# Patient Record
Sex: Male | Born: 1965
Health system: Southern US, Community
[De-identification: ages and names within clinical notes are randomized; demographics above are authoritative.]

## PROBLEM LIST (undated history)

## (undated) DIAGNOSIS — M1990 Unspecified osteoarthritis, unspecified site: Secondary | ICD-10-CM

## (undated) DIAGNOSIS — T4145XA Adverse effect of unspecified anesthetic, initial encounter: Secondary | ICD-10-CM

## (undated) DIAGNOSIS — T8859XA Other complications of anesthesia, initial encounter: Secondary | ICD-10-CM

## (undated) DIAGNOSIS — J449 Chronic obstructive pulmonary disease, unspecified: Secondary | ICD-10-CM

## (undated) DIAGNOSIS — D649 Anemia, unspecified: Secondary | ICD-10-CM

## (undated) DIAGNOSIS — I10 Essential (primary) hypertension: Secondary | ICD-10-CM

## (undated) DIAGNOSIS — J189 Pneumonia, unspecified organism: Secondary | ICD-10-CM

## (undated) DIAGNOSIS — K219 Gastro-esophageal reflux disease without esophagitis: Secondary | ICD-10-CM

## (undated) DIAGNOSIS — R569 Unspecified convulsions: Secondary | ICD-10-CM

## (undated) DIAGNOSIS — L930 Discoid lupus erythematosus: Secondary | ICD-10-CM

## (undated) DIAGNOSIS — D759 Disease of blood and blood-forming organs, unspecified: Secondary | ICD-10-CM

## (undated) HISTORY — PX: BACK SURGERY: SHX140

## (undated) HISTORY — PX: OTHER SURGICAL HISTORY: SHX169

## (undated) HISTORY — PX: TONSILLECTOMY: SUR1361

## (undated) HISTORY — PX: HIP SURGERY: SHX245

---

## 1998-02-09 ENCOUNTER — Ambulatory Visit (HOSPITAL_COMMUNITY): Admission: RE | Admit: 1998-02-09 | Discharge: 1998-02-09 | Payer: Self-pay | Admitting: *Deleted

## 1998-02-11 ENCOUNTER — Ambulatory Visit (HOSPITAL_BASED_OUTPATIENT_CLINIC_OR_DEPARTMENT_OTHER): Admission: RE | Admit: 1998-02-11 | Discharge: 1998-02-11 | Payer: Self-pay | Admitting: *Deleted

## 1998-02-13 ENCOUNTER — Emergency Department (HOSPITAL_COMMUNITY): Admission: EM | Admit: 1998-02-13 | Discharge: 1998-02-13 | Payer: Self-pay | Admitting: Emergency Medicine

## 1998-02-14 ENCOUNTER — Ambulatory Visit (HOSPITAL_COMMUNITY): Admission: RE | Admit: 1998-02-14 | Discharge: 1998-02-14 | Payer: Self-pay | Admitting: *Deleted

## 1998-09-29 ENCOUNTER — Ambulatory Visit (HOSPITAL_COMMUNITY): Admission: RE | Admit: 1998-09-29 | Discharge: 1998-09-29 | Payer: Self-pay | Admitting: Orthopedic Surgery

## 1998-12-15 ENCOUNTER — Ambulatory Visit (HOSPITAL_BASED_OUTPATIENT_CLINIC_OR_DEPARTMENT_OTHER): Admission: RE | Admit: 1998-12-15 | Discharge: 1998-12-15 | Payer: Self-pay | Admitting: Orthopedic Surgery

## 1999-06-17 ENCOUNTER — Emergency Department (HOSPITAL_COMMUNITY): Admission: EM | Admit: 1999-06-17 | Discharge: 1999-06-17 | Payer: Self-pay | Admitting: Emergency Medicine

## 1999-08-16 ENCOUNTER — Emergency Department (HOSPITAL_COMMUNITY): Admission: EM | Admit: 1999-08-16 | Discharge: 1999-08-16 | Payer: Self-pay

## 1999-09-16 ENCOUNTER — Emergency Department (HOSPITAL_COMMUNITY): Admission: EM | Admit: 1999-09-16 | Discharge: 1999-09-16 | Payer: Self-pay | Admitting: Emergency Medicine

## 1999-09-27 ENCOUNTER — Emergency Department (HOSPITAL_COMMUNITY): Admission: EM | Admit: 1999-09-27 | Discharge: 1999-09-27 | Payer: Self-pay | Admitting: Emergency Medicine

## 1999-10-28 ENCOUNTER — Encounter: Payer: Self-pay | Admitting: Emergency Medicine

## 1999-10-28 ENCOUNTER — Emergency Department (HOSPITAL_COMMUNITY): Admission: EM | Admit: 1999-10-28 | Discharge: 1999-10-28 | Payer: Self-pay | Admitting: Emergency Medicine

## 2000-01-18 ENCOUNTER — Emergency Department (HOSPITAL_COMMUNITY): Admission: EM | Admit: 2000-01-18 | Discharge: 2000-01-18 | Payer: Self-pay

## 2000-07-19 ENCOUNTER — Encounter: Payer: Self-pay | Admitting: Emergency Medicine

## 2000-07-19 ENCOUNTER — Emergency Department (HOSPITAL_COMMUNITY): Admission: EM | Admit: 2000-07-19 | Discharge: 2000-07-19 | Payer: Self-pay | Admitting: Emergency Medicine

## 2000-12-25 ENCOUNTER — Encounter: Payer: Self-pay | Admitting: Emergency Medicine

## 2000-12-25 ENCOUNTER — Emergency Department (HOSPITAL_COMMUNITY): Admission: EM | Admit: 2000-12-25 | Discharge: 2000-12-25 | Payer: Self-pay

## 2001-01-14 ENCOUNTER — Encounter: Admission: RE | Admit: 2001-01-14 | Discharge: 2001-01-14 | Payer: Self-pay | Admitting: Internal Medicine

## 2001-04-23 ENCOUNTER — Emergency Department (HOSPITAL_COMMUNITY): Admission: EM | Admit: 2001-04-23 | Discharge: 2001-04-23 | Payer: Self-pay | Admitting: Emergency Medicine

## 2001-04-23 ENCOUNTER — Encounter: Payer: Self-pay | Admitting: Emergency Medicine

## 2001-05-03 ENCOUNTER — Emergency Department (HOSPITAL_COMMUNITY): Admission: EM | Admit: 2001-05-03 | Discharge: 2001-05-03 | Payer: Self-pay | Admitting: Emergency Medicine

## 2001-08-27 ENCOUNTER — Emergency Department (HOSPITAL_COMMUNITY): Admission: EM | Admit: 2001-08-27 | Discharge: 2001-08-27 | Payer: Self-pay

## 2002-07-24 ENCOUNTER — Ambulatory Visit (HOSPITAL_COMMUNITY): Admission: RE | Admit: 2002-07-24 | Discharge: 2002-07-24 | Payer: Self-pay | Admitting: Orthopedic Surgery

## 2002-08-06 ENCOUNTER — Emergency Department (HOSPITAL_COMMUNITY): Admission: EM | Admit: 2002-08-06 | Discharge: 2002-08-06 | Payer: Self-pay | Admitting: Emergency Medicine

## 2002-08-06 ENCOUNTER — Encounter: Payer: Self-pay | Admitting: Emergency Medicine

## 2002-08-12 ENCOUNTER — Ambulatory Visit (HOSPITAL_COMMUNITY): Admission: RE | Admit: 2002-08-12 | Discharge: 2002-08-12 | Payer: Self-pay | Admitting: Orthopedic Surgery

## 2002-08-12 ENCOUNTER — Encounter: Payer: Self-pay | Admitting: Orthopedic Surgery

## 2002-08-28 ENCOUNTER — Encounter: Admission: RE | Admit: 2002-08-28 | Discharge: 2002-11-26 | Payer: Self-pay | Admitting: Orthopedic Surgery

## 2003-02-05 ENCOUNTER — Encounter: Admission: RE | Admit: 2003-02-05 | Discharge: 2003-02-05 | Payer: Self-pay | Admitting: Orthopedic Surgery

## 2003-02-05 ENCOUNTER — Encounter: Payer: Self-pay | Admitting: Orthopedic Surgery

## 2003-02-18 ENCOUNTER — Encounter: Admission: RE | Admit: 2003-02-18 | Discharge: 2003-02-18 | Payer: Self-pay | Admitting: Orthopedic Surgery

## 2003-02-18 ENCOUNTER — Encounter: Payer: Self-pay | Admitting: Orthopedic Surgery

## 2003-08-18 ENCOUNTER — Encounter: Admission: RE | Admit: 2003-08-18 | Discharge: 2003-08-18 | Payer: Self-pay | Admitting: Neurosurgery

## 2003-12-02 ENCOUNTER — Inpatient Hospital Stay (HOSPITAL_COMMUNITY): Admission: RE | Admit: 2003-12-02 | Discharge: 2003-12-05 | Payer: Self-pay | Admitting: Neurosurgery

## 2004-01-19 ENCOUNTER — Encounter: Admission: RE | Admit: 2004-01-19 | Discharge: 2004-01-19 | Payer: Self-pay | Admitting: Neurosurgery

## 2004-03-22 ENCOUNTER — Encounter: Admission: RE | Admit: 2004-03-22 | Discharge: 2004-03-22 | Payer: Self-pay | Admitting: Neurosurgery

## 2004-06-21 ENCOUNTER — Encounter: Admission: RE | Admit: 2004-06-21 | Discharge: 2004-06-21 | Payer: Self-pay | Admitting: Neurosurgery

## 2004-10-20 ENCOUNTER — Encounter: Admission: RE | Admit: 2004-10-20 | Discharge: 2004-10-20 | Payer: Self-pay | Admitting: Neurosurgery

## 2004-12-18 ENCOUNTER — Emergency Department (HOSPITAL_COMMUNITY): Admission: EM | Admit: 2004-12-18 | Discharge: 2004-12-19 | Payer: Self-pay | Admitting: Emergency Medicine

## 2005-12-15 ENCOUNTER — Inpatient Hospital Stay (HOSPITAL_COMMUNITY): Admission: EM | Admit: 2005-12-15 | Discharge: 2005-12-18 | Payer: Self-pay | Admitting: Emergency Medicine

## 2005-12-20 ENCOUNTER — Ambulatory Visit: Payer: Self-pay | Admitting: Family Medicine

## 2006-01-04 ENCOUNTER — Ambulatory Visit: Payer: Self-pay | Admitting: Family Medicine

## 2006-01-11 ENCOUNTER — Ambulatory Visit: Payer: Self-pay | Admitting: Family Medicine

## 2006-02-28 ENCOUNTER — Ambulatory Visit: Payer: Self-pay | Admitting: Family Medicine

## 2006-05-15 ENCOUNTER — Ambulatory Visit: Payer: Self-pay | Admitting: Family Medicine

## 2006-10-29 ENCOUNTER — Ambulatory Visit: Payer: Self-pay | Admitting: Family Medicine

## 2006-12-01 ENCOUNTER — Emergency Department (HOSPITAL_COMMUNITY): Admission: EM | Admit: 2006-12-01 | Discharge: 2006-12-01 | Payer: Self-pay | Admitting: Family Medicine

## 2007-01-21 ENCOUNTER — Encounter: Admission: RE | Admit: 2007-01-21 | Discharge: 2007-01-21 | Payer: Self-pay | Admitting: Neurosurgery

## 2007-02-15 ENCOUNTER — Emergency Department (HOSPITAL_COMMUNITY): Admission: EM | Admit: 2007-02-15 | Discharge: 2007-02-15 | Payer: Self-pay | Admitting: Emergency Medicine

## 2007-04-03 ENCOUNTER — Encounter: Admission: RE | Admit: 2007-04-03 | Discharge: 2007-04-03 | Payer: Self-pay | Admitting: Neurosurgery

## 2007-05-29 ENCOUNTER — Inpatient Hospital Stay (HOSPITAL_COMMUNITY): Admission: RE | Admit: 2007-05-29 | Discharge: 2007-06-03 | Payer: Self-pay | Admitting: Neurosurgery

## 2007-06-09 ENCOUNTER — Inpatient Hospital Stay (HOSPITAL_COMMUNITY): Admission: EM | Admit: 2007-06-09 | Discharge: 2007-06-10 | Payer: Self-pay | Admitting: Family Medicine

## 2007-07-20 ENCOUNTER — Encounter: Admission: RE | Admit: 2007-07-20 | Discharge: 2007-07-20 | Payer: Self-pay | Admitting: Neurosurgery

## 2008-03-06 ENCOUNTER — Encounter: Admission: RE | Admit: 2008-03-06 | Discharge: 2008-03-06 | Payer: Self-pay | Admitting: Neurosurgery

## 2008-04-12 ENCOUNTER — Encounter: Admission: RE | Admit: 2008-04-12 | Discharge: 2008-04-12 | Payer: Self-pay | Admitting: Neurosurgery

## 2008-07-16 ENCOUNTER — Encounter: Admission: RE | Admit: 2008-07-16 | Discharge: 2008-07-16 | Payer: Self-pay | Admitting: Neurosurgery

## 2008-09-23 ENCOUNTER — Encounter: Admission: RE | Admit: 2008-09-23 | Discharge: 2008-09-23 | Payer: Self-pay | Admitting: Family Medicine

## 2009-05-20 ENCOUNTER — Encounter: Admission: RE | Admit: 2009-05-20 | Discharge: 2009-05-20 | Payer: Self-pay | Admitting: Neurosurgery

## 2009-06-16 ENCOUNTER — Ambulatory Visit (HOSPITAL_COMMUNITY): Admission: RE | Admit: 2009-06-16 | Discharge: 2009-06-16 | Payer: Self-pay | Admitting: Neurosurgery

## 2009-07-10 ENCOUNTER — Emergency Department (HOSPITAL_COMMUNITY): Admission: EM | Admit: 2009-07-10 | Discharge: 2009-07-10 | Payer: Self-pay | Admitting: Family Medicine

## 2009-07-19 ENCOUNTER — Emergency Department (HOSPITAL_COMMUNITY): Admission: EM | Admit: 2009-07-19 | Discharge: 2009-07-19 | Payer: Self-pay | Admitting: Emergency Medicine

## 2009-07-19 ENCOUNTER — Ambulatory Visit (HOSPITAL_COMMUNITY): Admission: RE | Admit: 2009-07-19 | Discharge: 2009-07-19 | Payer: Self-pay | Admitting: Emergency Medicine

## 2009-09-23 ENCOUNTER — Encounter: Admission: RE | Admit: 2009-09-23 | Discharge: 2009-09-23 | Payer: Self-pay | Admitting: Neurosurgery

## 2009-09-24 ENCOUNTER — Inpatient Hospital Stay (HOSPITAL_COMMUNITY): Admission: RE | Admit: 2009-09-24 | Discharge: 2009-09-25 | Payer: Self-pay | Admitting: Neurosurgery

## 2009-10-05 ENCOUNTER — Encounter: Admission: RE | Admit: 2009-10-05 | Discharge: 2009-10-05 | Payer: Self-pay | Admitting: Neurosurgery

## 2009-11-18 ENCOUNTER — Encounter: Admission: RE | Admit: 2009-11-18 | Discharge: 2009-11-18 | Payer: Self-pay | Admitting: Neurosurgery

## 2009-12-29 ENCOUNTER — Encounter: Admission: RE | Admit: 2009-12-29 | Discharge: 2009-12-29 | Payer: Self-pay | Admitting: Neurosurgery

## 2010-03-03 ENCOUNTER — Encounter: Admission: RE | Admit: 2010-03-03 | Discharge: 2010-03-03 | Payer: Self-pay | Admitting: Neurosurgery

## 2010-05-10 ENCOUNTER — Encounter: Admission: RE | Admit: 2010-05-10 | Discharge: 2010-05-10 | Payer: Self-pay | Admitting: Neurosurgery

## 2010-07-14 ENCOUNTER — Encounter
Admission: RE | Admit: 2010-07-14 | Discharge: 2010-07-14 | Payer: Self-pay | Source: Home / Self Care | Attending: Neurosurgery | Admitting: Neurosurgery

## 2010-07-24 ENCOUNTER — Encounter: Payer: Self-pay | Admitting: Neurosurgery

## 2010-07-24 ENCOUNTER — Encounter: Payer: Self-pay | Admitting: Orthopedic Surgery

## 2010-07-25 ENCOUNTER — Encounter: Payer: Self-pay | Admitting: Neurosurgery

## 2010-08-01 ENCOUNTER — Encounter
Admission: RE | Admit: 2010-08-01 | Discharge: 2010-08-01 | Payer: Self-pay | Source: Home / Self Care | Attending: Neurosurgery | Admitting: Neurosurgery

## 2010-08-03 ENCOUNTER — Encounter: Payer: Self-pay | Admitting: Neurosurgery

## 2010-08-10 ENCOUNTER — Encounter (HOSPITAL_COMMUNITY)
Admission: RE | Admit: 2010-08-10 | Discharge: 2010-08-10 | Disposition: A | Discharge status: Home / Self Care | Payer: Medicare Other | Source: Ambulatory Visit | Attending: Hematology and Oncology | Admitting: Hematology and Oncology

## 2010-08-10 DIAGNOSIS — M40299 Other kyphosis, site unspecified: Secondary | ICD-10-CM | POA: Insufficient documentation

## 2010-08-10 DIAGNOSIS — Z01810 Encounter for preprocedural cardiovascular examination: Secondary | ICD-10-CM | POA: Insufficient documentation

## 2010-08-10 DIAGNOSIS — Z01812 Encounter for preprocedural laboratory examination: Secondary | ICD-10-CM | POA: Insufficient documentation

## 2010-08-10 LAB — CBC
HCT: 41.5 % (ref 39.0–52.0)
Hemoglobin: 13.9 g/dL (ref 13.0–17.0)
MCH: 29.6 pg (ref 26.0–34.0)
MCHC: 33.5 g/dL (ref 30.0–36.0)
MCV: 88.3 fL (ref 78.0–100.0)
Platelets: 268 10*3/uL (ref 150–400)
RBC: 4.7 MIL/uL (ref 4.22–5.81)
RDW: 13 % (ref 11.5–15.5)
WBC: 6 10*3/uL (ref 4.0–10.5)

## 2010-08-10 LAB — BASIC METABOLIC PANEL
BUN: 10 mg/dL (ref 6–23)
CO2: 30 mEq/L (ref 19–32)
Calcium: 9.7 mg/dL (ref 8.4–10.5)
Chloride: 104 mEq/L (ref 96–112)
Creatinine, Ser: 1.06 mg/dL (ref 0.4–1.5)
GFR calc Af Amer: >60 mL/min (ref >60)
GFR calc non Af Amer: >60 mL/min (ref >60)
Glucose, Bld: 123 mg/dL — ABNORMAL HIGH (ref 70–99)
Potassium: 4.5 mEq/L (ref 3.5–5.1)
Sodium: 142 mEq/L (ref 135–145)

## 2010-08-10 LAB — SURGICAL PCR SCREEN: Staphylococcus aureus: NEGATIVE

## 2010-08-12 ENCOUNTER — Ambulatory Visit (HOSPITAL_COMMUNITY)
Admission: RE | Admit: 2010-08-12 | Discharge: 2010-08-12 | Disposition: A | Discharge status: Home / Self Care | Payer: Medicare Other | Source: Ambulatory Visit | Attending: Neurosurgery | Admitting: Neurosurgery

## 2010-08-12 ENCOUNTER — Other Ambulatory Visit: Payer: Self-pay | Admitting: Neurosurgery

## 2010-08-12 ENCOUNTER — Inpatient Hospital Stay (HOSPITAL_COMMUNITY)
Admission: RE | Admit: 2010-08-12 | Discharge: 2010-08-15 | DRG: 458 | Disposition: A | Discharge status: Home / Self Care | Payer: Medicare Other | Source: Ambulatory Visit | Attending: Neurosurgery | Admitting: Neurosurgery

## 2010-08-12 DIAGNOSIS — Z01810 Encounter for preprocedural cardiovascular examination: Secondary | ICD-10-CM

## 2010-08-12 DIAGNOSIS — M545 Low back pain: Secondary | ICD-10-CM

## 2010-08-12 DIAGNOSIS — Z981 Arthrodesis status: Secondary | ICD-10-CM

## 2010-08-12 DIAGNOSIS — Z01812 Encounter for preprocedural laboratory examination: Secondary | ICD-10-CM

## 2010-08-12 DIAGNOSIS — M40299 Other kyphosis, site unspecified: Principal | ICD-10-CM | POA: Diagnosis present

## 2010-08-12 LAB — CBC
HCT: 37.5 % — ABNORMAL LOW (ref 39.0–52.0)
Hemoglobin: 12.3 g/dL — ABNORMAL LOW (ref 13.0–17.0)
MCH: 28.8 pg (ref 26.0–34.0)
MCHC: 32.8 g/dL (ref 30.0–36.0)
MCV: 87.8 fL (ref 78.0–100.0)

## 2010-08-13 ENCOUNTER — Inpatient Hospital Stay (HOSPITAL_COMMUNITY): Payer: Medicare Other

## 2010-08-13 LAB — GLUCOSE, CAPILLARY
Glucose-Capillary: 147 mg/dL — ABNORMAL HIGH (ref 70–99)
Glucose-Capillary: 130 mg/dL — ABNORMAL HIGH (ref 70–99)
Glucose-Capillary: 146 mg/dL — ABNORMAL HIGH (ref 70–99)

## 2010-08-14 LAB — GLUCOSE, CAPILLARY
Glucose-Capillary: 125 mg/dL — ABNORMAL HIGH (ref 70–99)
Glucose-Capillary: 107 mg/dL — ABNORMAL HIGH (ref 70–99)

## 2010-08-15 LAB — GLUCOSE, CAPILLARY: Glucose-Capillary: 103 mg/dL — ABNORMAL HIGH (ref 70–99)

## 2010-08-25 NOTE — Op Note (Addendum)
NAME:  LEVII, HAIRFIELD NO.:  [id number]  MEDICAL RECORD NO.:  [id number]           PATIENT TYPE:  O  LOCATION:  XRAY                         FACILITY:  [facility]  PHYSICIAN:  Donalee Citrin, M.D.        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  PROCEDURES:  Re-exploration of fusion, removal of hardware L3-4, pedicle screw fixation L2-L4, posterolateral arthrodesis L2-L4 using BMP and Actifuse.  SURGEON:  Donalee Citrin, MD  ASSISTANT:  Kathaleen Maser. Pool, MD  ANESTHESIA:  General endotracheal.  HISTORY OF PRESENT ILLNESS:  The patient is a very pleasant 45 year old gentleman who underwent a L2-3 standard XLIF several months ago.  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  Due to the patient's failure to conservative treatment, MRI, and CT findings, the patient was recommended reexploration of fusion and redo posterolateral fusion at L3- 4 as well as posterior augmentation at L2-3 with pedicle screws.  Risks and benefits of the operation were explained.  The patient understood and agreed to proceed forth.  DESCRIPTION OF PROCEDURE:  The patient was brought to the OR, was induced general anesthesia, positioned prone on the Wilson frame.  Back was prepped and draped in usual sterile fashion.  His old incision was opened up and extended cephalad.  The T-piece at L2 were identified after the scar tissue was dissected free and the hardware was identified.  T-piece were exposed at L2, 3, and 4, the nuts were removed and the rods removed and the fusion in the L3-4 appeared to be solid, although  there was some breaks in the bone in the posterolateral spaces upon visualization, so after adequate T-piece had been exposed and the L2 pedicle had been exposed, fluoroscopy was brought in and the L2 pedicle was cannulated, probed with a tap and the awl, probed tap with a 5.5 tap and a 6.5 x 45 screw inserted at L2 bilaterally.  Initially on the right side L2 screw had some difficulty in one direction was performed.  The pedicle was easily cannulated and from using fluoroscopy bony landmarks, the canal, as well as probing within the pedicle, this confirmed the mediolateral breach.  Then after copious irrigation, aggressive decortication was carried out at T-piece at L2, 3, 4, 5 bilaterally.  BMP was placed in the posterolateral gutter as well as Actifuse on top and the 80-mm rods were placed and compressed from L2-L3 to reduce the kyphotic deformity and then all the nuts were anchored down and tightened down and torqued down and 2 large Hemovac drain was placed and wound was closed in layers with interrupted Vicryl and skin was closed with running 4-0 subcuticular.  Benzoin and Steri-Strips were applied.  The patient went to the recovery room in a stable condition.  ______________________________ Donalee Citrin, M.D.     GC/MEDQ  D:  08/12/2010  T:  08/13/2010  Job:  [id number]  Electronically Signed by Donalee Citrin M.D. on 08/25/2010 07:06:43 AM

## 2010-09-18 NOTE — Discharge Summary (Addendum)
  NAME:  [name], YOAKUM NO.:  [id number]  MEDICAL RECORD NO.:  [id number]           PATIENT TYPE:  O  LOCATION:  XRAY                         FACILITY:  [facility]  PHYSICIAN:  Donalee Citrin, M.D.        DATE OF BIRTH:  December 28, 1965  DATE OF ADMISSION:  08/13/2010 DATE OF DISCHARGE:  08/15/2010                              DISCHARGE SUMMARY   ADMITTING DIAGNOSIS:  Junctional kyphosis and instability, L2-3.  PROCEDURE:  Posterior segmental fixation, L2-3, for augmentation of L2-3 fusion with failure of L2-3 fusion and pseudoarthrosis L3-4.  HOSPITAL COURSE:  The patient was admitted and immediately went to the operating room, underwent the aforementioned procedure.  Postop, the patient did very well and recovering on the floor.  He was doing very well initially.  He did have a low grade swelling around his incision that was consistent with subcutaneous hematoma.  However, he did have a large Hemovac drain in place.  There was no dissection carried down to the epidural spaces.  This was all felt to be in the fascial and extrafascial compartment, so it was felt to be safe.  This was observed. He was placed on ice packs and over the next couple of days he stabilized.  He continued to mobilize progressively with significant improvement in his lower extremity pain.  At one point, he did get a CT checked of his back secondary to persistent low back pain and for evaluation of the hematoma.  However, it was benign and the screws looked being in good position.  So, his Hemovac was left in place and it was able to be taken out on hospital day #4.  He continued to have a little bit of oozing around the incision, but this did stabilize with multiple dressing changes.  The swelling did go down.  His pain became under better control with oral pain medication.  He was ambulating and voiding spontaneously and was able to be discharged home on hospital day #5 in stable condition on oral  pain medication.  At the time of discharge, the patient was ambulating, voiding spontaneously, and tolerating regular diet.          ______________________________ Donalee Citrin, M.D.     GC/MEDQ  D:  09/07/2010  T:  09/08/2010  Job:  [id number]  Electronically Signed by Donalee Citrin M.D. on 10/19/2010 05:16:44 PM

## 2010-09-26 LAB — TYPE AND SCREEN
ABO/RH(D): O POS
Antibody Screen: NEGATIVE

## 2010-09-26 LAB — CBC
HCT: 44.2 % (ref 39.0–52.0)
Platelets: 230 10*3/uL (ref 150–400)
RDW: 14.1 % (ref 11.5–15.5)
WBC: 5.1 10*3/uL (ref 4.0–10.5)

## 2010-09-26 LAB — GLUCOSE, CAPILLARY: Glucose-Capillary: 135 mg/dL — ABNORMAL HIGH (ref 70–99)

## 2010-09-26 LAB — BASIC METABOLIC PANEL
CO2: 28 mEq/L (ref 19–32)
Calcium: 9.2 mg/dL (ref 8.4–10.5)
Chloride: 103 mEq/L (ref 96–112)
GFR calc Af Amer: >60 mL/min (ref >60)
Sodium: 139 mEq/L (ref 135–145)

## 2010-09-26 LAB — SURGICAL PCR SCREEN
MRSA, PCR: NEGATIVE
Staphylococcus aureus: NEGATIVE

## 2010-10-04 LAB — BASIC METABOLIC PANEL
BUN: 11 mg/dL (ref 6–23)
CO2: 30 mEq/L (ref 19–32)
Chloride: 102 mEq/L (ref 96–112)
Creatinine, Ser: 0.85 mg/dL (ref 0.4–1.5)
GFR calc Af Amer: >60 mL/min (ref >60)
Potassium: 4.1 mEq/L (ref 3.5–5.1)

## 2010-10-04 LAB — CBC
HCT: 48.7 % (ref 39.0–52.0)
MCHC: 33.1 g/dL (ref 30.0–36.0)
MCV: 90.2 fL (ref 78.0–100.0)
RBC: 5.39 MIL/uL (ref 4.22–5.81)

## 2010-10-04 LAB — TYPE AND SCREEN: ABO/RH(D): O POS

## 2010-11-08 ENCOUNTER — Other Ambulatory Visit: Payer: Self-pay | Admitting: Neurosurgery

## 2010-11-08 ENCOUNTER — Ambulatory Visit
Admission: RE | Admit: 2010-11-08 | Discharge: 2010-11-08 | Disposition: A | Discharge status: Home / Self Care | Payer: Medicare Other | Source: Ambulatory Visit | Attending: Neurosurgery | Admitting: Neurosurgery

## 2010-11-08 DIAGNOSIS — M545 Low back pain: Secondary | ICD-10-CM

## 2010-11-15 NOTE — Op Note (Signed)
[name], [name] NO.:  [id number]   MEDICAL RECORD NO.:  [id number]          PATIENT TYPE:  INP   LOCATION:  3012                         FACILITY:  [facility]   PHYSICIAN:  Donalee Citrin, M.D.        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   PROCEDURE:  Reexploration of lumbar fusion L4 to S1 with removal of  hardware L4-S1, posterior lumbar interbody fusion L3-4 using a hybrid 12  x 22-mm PEEK allograft cage packed with locally harvested autograft  mixed with Progenix bone substitute and Tangent 12 x 26-mm allograft  wedge, pedicle screw fixation using the 6.35 legacy pedicle screw system  L3-4 with posterior lateral arthrodesis L3-4 using locally harvested  autograft mixed with Progenix bone substitute and placement of a median  Hemovac.   SURGEON:  Donalee Citrin, M.D.   ASSISTANT:  Tia Alert, M.D.   ANESTHESIA:  General endotracheal.   HISTORY OF PRESENT ILLNESS:  The patient is a very pleasant 45 year old  gentleman who underwent L4-S1 fusion back in 2005 and did very well.  Over the last several months, had progressively worsening back and  bilateral leg pain radiating to his hips and down his anterior thighs as  well as below his knees in the front of his shins consistent with both  L3 and L4 nerve root syndromes.  Repeat imaging with MRI scan and CT  scan showed progression and severe spinal stenosis L3-4 with diastasis  of facet complex and bifrontal stenosis of the L3 and L4 nerve roots at  this level.  Solid bony effusion is also noted on a CT scan at L4 to S1.  So, the patient had failed all forms of conservative treatment with oral  steroids, anti-inflammatories, physical therapy and the patient is  recommended reexploration of lumbar fusion with removal of hardware L4  to S1 and decompressive laminectomy and fusion at L3-4.  Risks and  benefits of the operative procedure were explained to the patient  __________   The patient was brought to the OR __________  under general anesthesia,  placed prone on the Wilson frame.  Back was prepped and draped in usual  sterile fashion.  His old incision was opened up and extended cephalad.  The scar tissue was dissected free down to the pedicle screw and the  hardware at L4-S1.  The fusion was inspected and was noted markedly  solid, so the cross-link was removed, and the __________  were removed  and the screws at L5-S1 were removed, again inspecting the fusion  __________  along the way and confirming solid bony fusion from L4 to  S1.  The L4 screws were left in place.  At this point, the subperiosteal  dissection was carried out at the lamina of L3, and the pedicle at L3  was identified and then the spinous process at L3 was removed.  The scar  tissue was dissected free, and  complete central decompression was  performed at L3-4 with complete medial facetectomies at L3-4.  Extensive  scar tissue was dissected off of the medial facet complex to free up the  L4 nerve root.  Then, the L3 nerve root was also unroofed, radically  decompressed out its foramen, and the L3 nerve root on the patient's  left side was noted to be markedly stenotic and partially atrophic.  This appeared to be small in caliber due to chronic compression.  This  all unroofed, teased away.  The disk was teased from underneath it as  well as the facet complex and pars were unroofed from above it.  The TP  at L4 had been then identified, and TP at L3 was identified, and this  path was cleaned out.  After complete decompression had been completed,  pilot holes were drilled at L3 pedicle on the right __________  all  probed, tapped with a 5.5 tap, probed again and a 6.5/45 screw L3 on the  right.  The pedicle was probed from within the pedicle as  well as within  the canal, and external bony landmarks were confirmed.  No mediolateral  breach.  Fluoroscopy was used each step along the way to confirm  trajectory.  In a similar fashion, L3 screw was placed on the left side  with a 6.5/45 screw there as well.  After there was 2 screws in place  and the residual L4 screws had been left in place, attention was taken  to the interbody where the right L4 nerve root was reflected medially.  Annulotomy __________  several large fragments of disk were removed.  This was markedly degenerated, and several large fragments were removed  in the central compartment.  A size 10 distractor was initially  inserted, and this was felt to be too small.  A size 12 distractor was  subsequently insert.  This had good apposition of the endplates and felt  to be appropriate sizing for graft material.  Then, the left L4 nerve  roots were reflected.  Disk space was cleaned out with a size 12 cutter  and chisel.  Again, several large fragments were removed from the  central compartments as well as underneath the 3 root, decompressing it.  The disk space was distracted, opening up the L3 foramen, and then after  this been cleaned out, a Tangent allograft was inserted on the patient's  left side to move deep to posterior vertebral line.  Then, the right  sided distractor was removed.  Disk space cleaned out in similar  fashion, again with fluoroscopy used each step along the way.  After  disk space had been chiseled and the central disk had been removed with  an Epstein curette and the central endplates had been scraped, a copious  amount of locally harvested autograft mixed with Progenix was then  packed medially against the Tangent on the left side.  Then, a right  side Telamon packed with graft material was inserted on the right.  Both  grafts were confirmed to be in good position with fluoroscopy.  Then,  the wound was copiously irrigated.  Meticulous  hemostasis was  maintained.  Aggressive decortication was carried __________  lateral  gutter.  The remainder of the autograft was packed in the lateral  gutters along L3-4 on the left and the right.  A 50-mm rod was then  inserted and __________  down the L3 pedicle screws compressed against  L4, and then a 421 cross-link  was inserted and anchored down.  All  neural foramina were reinspected prior to placing the cross-link to  confirm adequate decompression of the foramen and no migration of graft  material.  Gelfoam was overlaid atop of the nerve roots, and a medium  Hemovac drain was threaded underneath the cross-link centrally.  Then,  the muscle and fascia was approximated with interrupted Vicryls after  final fluoroscopy confirmed good position of bone graft, screws and  rods.  Then, the skin was closed with a running 4-0 subcuticular,  Benzoin and Steri-Strips.  The patient went to recovery in stable  condition __________           ______________________________  Donalee Citrin, M.D.     GC/MEDQ  D:  05/29/2007  T:  05/30/2007  Job:  [id number]

## 2010-11-15 NOTE — Op Note (Signed)
NAMEALMUS, WOODHAM NO.:  [id number]   MEDICAL RECORD NO.:  [id number]          PATIENT TYPE:  INP   LOCATION:  3012                         FACILITY:  [facility]   PHYSICIAN:  Donalee Citrin, M.D.        DATE OF BIRTH:  08/20/1965   DATE OF PROCEDURE:  05/30/2007  DATE OF DISCHARGE:                               OPERATIVE REPORT   PREOPERATIVE DIAGNOSIS:  Lumbar wound, epidural hematoma.   POSTOPERATIVE DIAGNOSIS:  Lumbar wound, epidural hematoma.   PROCEDURE:  Re-exploration for evacuation of lumbar epidural hematoma.   SURGEON:  Donalee Citrin, M.D.   ANESTHESIA:  General endotracheal.   HISTORY OF PRESENT ILLNESS:  The patient is a very pleasant 41-year  gentleman who underwent earlier today a L3-4 posterior lumbar interbody  fusion with removal of hardware and exploration of fusion L4-S1 and the  patient did very well, went to recovery room.  Postop it was stable with  postoperative Hemovac drain in place in recovery, went to the floor.  Earlier this evening the physician on call was contacted as the patient  was experiencing some numbness in his legs and some swelling around the  incision site.  He came and evaluated then I was subsequently called and  came in and evaluated the patient, took him emergently to the OR for  evacuation of a lumbar wound epidural hematoma. Risks and benefits of  the operation were explained the patient.  He understands and agreed to  proceed forth.   The patient brought to OR, induced under general anesthesia, placed  prone on Wilson Frame.  Back was prepped draped usual sterile fashion.  Old incision was opened up and immediately a very large extra fascial  hematoma was identified.  This had actually eroded through and actually  had ruptured due to swelling, some of the fascial suture.  As this was  further, the wound was opened up down to the hardware and the epidural  space an extensive amount of a very aggressive lumbar wound  hematoma  extended in the epidural space with small oozes everywhere but no large  feeder.  Once the epidural space was identified, and all the hematoma  been evacuated off thecal sac and nerve roots, there was some small  bleeders overlying the disk space underneath the nerve roots.  These  were all coagulated and there was a little bit of oozing coming from the  laminotomy defects in the bone.  This was all waxed off.  The epidural  veins were coagulated.  Extensive investigation was carried out and the  subfascial spaces along the muscle along the previous laminotomy sites  coagulating all small bleeders, but the patient clearly was oozing a  slow and steady from almost all the muscle and fascia throughout his  lumbar wound.  This was very aggressive and definitely seemed to be  extensive over normal.  This felt to be related to some sort of bleeding  diathesis maybe chronic ibuprofen use so laboratory will be sent  postoperatively for this.  After the epidural had been evacuated and  meticulous hemostasis had  been maintained the epidural space, some  Gelfoam was laid atop of the dura and a medium sized Hemovac drain was  again replaced in the epidural space, then the muscle was closed over  the top of this.  Then a large Hemovac was inserted underneath the  fascia and the muscle extending the length of where the previous fusion  had been. This also would communicate with the epidural space and the  subfascial space.  Then the fascia was then reapproximated with  interrupted Vicryls as well as the muscle and subcutaneous was closed  with  interrupted Vicryl and skin was closed with Dermabond, Benzoin, Steri-  Strips and dressed with the large Hemovac and medium size Hemovac drains  in place and sewn in place.  At the end of case, needle and sponge count  correct.  Patient went to recovery room in stable condition.           ______________________________  Donalee Citrin, M.D.      GC/MEDQ  D:  05/30/2007  T:  05/30/2007  Job:  [id number]

## 2010-11-15 NOTE — Op Note (Signed)
[name], [name] NO.:  [id number]   MEDICAL RECORD NO.:  [id number]          PATIENT TYPE:  INP   LOCATION:  3012                         FACILITY:  [facility]   PHYSICIAN:  Donalee Citrin, M.D.        DATE OF BIRTH:  03/28/1966   DATE OF PROCEDURE:  05/30/2007  DATE OF DISCHARGE:                               OPERATIVE REPORT   Note:  This is a second operation dictated on the same date.  This  second operation is 12:39.   PREOPERATIVE DIAGNOSIS:  Recurrent lumbar wound epidural hematoma.   PROCEDURE:  1. Re-exploration and evacuation for the second time of a lumbar wound      epidural hematoma with progressive paraparesis.  2. L2 laminectomy bilateral for evacuation of epidural hematoma.   SURGEON:  Donalee Citrin, M.D.   ANESTHESIA:  General endotracheal anesthesia.   HISTORY OF PRESENT ILLNESS:  The patient is a very pleasant 45 year old  gentleman who underwent an L3-4 lumbar fusion yesterday.  He had to be  taken back in the early hours of the morning this morning for evacuation  of lumbar epidural hematoma.  Postoperatively in the recovery room, the  patient was doing very well, full strength, no leg pain, no numbness or  tingling.  The patient was recovered in the recovery room and went to  the floor.  On morning rounds this morning, the patient was noted to  have complete foot drop on the left side with some numbness below the  knees and some pain down the backs of his legs.  Emergent CT scan was  ordered and the operating room was called again for reevaluation of the  hematoma.  During the CT scan, the patient's neurologic exam progressed  to lose dorsiflexion strength in his right foot and the CT scan did show  extension of his epidural hematoma behind the L2 lamina, so the patient  was emergently taken back to the OR for extension of his laminotomy  behind the two lamin and evacuation of hematoma.   OPERATIVE PROCEDURE:  The patient was brought into the  or and was  induced under general anesthesia, positioned prone on the Wilson frame,  prepped and draped in the usual fashion.  His old incision was opened up  and again as before, as soon as the dermis was opened up, a large  extrafascial hematoma was immediately visualized, however, the suture  this time was intact.  The sutures were incised.  The subfascial space  was entered.  Again a large hematoma in the wound was again aspirated  causing severe thecal sac compression.  After the hematoma was evacuated  from the epidural space, there was again noted to be oozing coming from  everywhere along the muscle and the wound in the epidural space with no  focal large bleeders, just all diffuse oozing felt to be consistent with  his chronic ibuprofen use that was not discontinued preoperatively.   At this point, the spinous process of L2 was removed and laminectomy  central was completed extending up to just above the L2 pedicle.  Intraoperative fluoroscopy confirmed localization of the cephalad  extension of laminotomy beyond the inferior aspect of the L2 pedicle.  This was consistent with the CT scan showing the extent of the epidural.  So the laminotomy was extended slightly superior to this to confirm  adequate decompression of the epidural hematoma.  Direct inspection date  confirm thecal sac widely decompressed at these levels of the neural  foramina at L3-L4.  L2, L3 and L4 were decompressed.  Meticulous  hemostasis again was attempted to be maintained in the epidural space,  waxing the bony edges, coagulating the epidural veins again using a  combination of Gelfoam and Surgifoam.  Meticulous hemostasis in the  epidural space was achieved.  While this was packed away, the muscle was  inspected.  Several large venous bleeders were coagulated to confirm  hemostasis in the muscle and around the surgical bed.   After this had all been copiously irrigated with multiple applications  and  reapplications of Gelfoam and Surgifoam and after the cross-link had  been removed, it was felt at this point, because of the recurrent  epidural hematomas in the thecal space and the cross-link providing a  structural scaffolding to facilitate hematoma compression of the thecal  sac, the cross-link was left off this time.  Upon confirming complete  hemostasis in the epidural space, two large Hemovac drains were then  tunneled and placed in the epidural space crisscrossing across the  central canal.  These were all tunneled also into the wound around the  L2 lamina.  Then the muscle overlying these was approximated with  interrupted Vicryl and then the fascia was again approximated with  interrupted Vicryl.  Then another Hemovac drain was placed that will  communicate from the extrafascial to the fascial space in the subdural  tissue to evacuate any potential oozing that may continue to occur  there.  Then this wound was closed in layers with interrupted Vicryl  with a running forceps to include the skin.  Benzoin and Steri-Strips  were applied.  The wound was dressed.   In addition, immediately preoperatively, the patient was given  preoperative antibiotics as well as a six-pack of platelets and a  combination of multiple reapplications of Surgifoam and Gelfoam and a  six-pack of platelets did appear to have the desired effect of creating  a dry wound.  The patient was then re-admitted to recovery in stable  condition.           ______________________________  Donalee Citrin, M.D.     GC/MEDQ  D:  05/30/2007  T:  05/30/2007  Job:  [id number]

## 2010-11-15 NOTE — Discharge Summary (Signed)
[name], [name] NO.:  [id number]   MEDICAL RECORD NO.:  [id number]          PATIENT TYPE:  INP   LOCATION:  3012                         FACILITY:  [facility]   PHYSICIAN:  Donalee Citrin, M.D.        DATE OF BIRTH:  08/15/1965   DATE OF ADMISSION:  05/29/2007  DATE OF DISCHARGE:  06/03/2007                               DISCHARGE SUMMARY   ADMITTING DIAGNOSIS:  Severe lumbar spinal stenosis.  Degenerative disc disease at L3-L4.  Exploration of fusion, removal of hardware, L4 to S1.   HPI:  Patient is a 45 year old gentleman, who was admitted to the  hospital and underwent the aforementioned procedures.  Postoperatively,  the patient did well for the first few hours.  However, in the evening,  patient developed severe low back pain and he was noted to have swelling  around his incision.  On-call physician, Dr. Gerlene Fee, came and saw the  patient and he was noted to have what appeared to be a wound hematoma  and patient had progressively worsening pain, so I was called and came  in and we did an evacuate of a lumbar epidural hematoma, as well as  wound hematoma that had broken through the fascial stitches and was  oozing from many different locations.  Postoperatively, the patient  confessed that he had been using ibuprofen at the time of the surgery,  without discontinuing it and this was felt to be consistent with the  patient's general platelet dysfunction.   Patient initially did well postoperatively.  He went to the floor.  On  morning rounds, a few hours later, patient was having progressive  weakness in the feet that he had not told anybody about, to where he was  having severe dorsiflexion weakness.  Patient was taken emergently to CT  scan to evaluate for recurrent epidural hematoma and evaluate the extent  to it extended through the areas where he had been previously  decompressed.  The stitch showed some tension above 2-3.  Patient was  emergently taken  back to the operating room, infusing platelets at this  time, with some re-extension of his decompression and complete  evacuation of hematoma.  Large drains were left in place, oozing  discontinued with a transfusion of platelets and patient was sent back  to recovery.  Over the next 24 hours, patient improved significantly in  function of his legs and his feet and was progressively mobilized over  the next several days, which patient did very well with.  At the time of  discharge, patient was ambulating and voiding spontaneously and his  strength had markedly improved.   He was scheduled for followup in two weeks.           ______________________________  Donalee Citrin, M.D.     GC/MEDQ  D:  07/17/2007  T:  07/17/2007  Job:  [id number]

## 2010-11-18 NOTE — Op Note (Signed)
NAME:  [name], [name]                        ACCOUNT NO.:  [id number]   MEDICAL RECORD NO.:  [id number]                   PATIENT TYPE:  OIB   LOCATION:  2899                                 FACILITY:  [facility]   PHYSICIAN:  Burnard Bunting, M.D.                 DATE OF BIRTH:  05/03/1966   DATE OF PROCEDURE:  08/12/2002  DATE OF DISCHARGE:  08/12/2002                                 OPERATIVE REPORT   PREOPERATIVE DIAGNOSIS:  Snapping right hip.   POSTOPERATIVE DIAGNOSIS:  Snapping right hip.   PROCEDURE:  Right hip arthroscopy.  Right hip fractional iliopsoas  lengthening.   SURGEON:  Burnard Bunting, M.D.   ANESTHESIA:  General endotracheal.   ESTIMATED BLOOD LOSS:  75 mL.   DRAINS:  None.   DESCRIPTION OF PROCEDURE:  The patient was brought to the operating room  where general endotracheal anesthesia was induced. Preoperative IV  antibiotics were administered. The patient was placed in traction on the  fracture table.  Right hip was prepped with Duraprep solution and draped out  as a sterile manner.  Topographical anatomy of the right hip was identified  including the superior, anterior, and posterior margins of the greater  trochanter.  Under fluoroscopic guidance, a spinal needle was placed over  the anterior aspect of the greater trochanter along the course of the  femoral neck into the hip joint. The leg was placed under longitudinal  traction prior to this maneuver.  The anterolateral incision was then made  and a Nitinol hand was placed through the spinal needle into the hip joint.  Subsequent dilation up to 7 mm was performed. The scope was then placed into  the hip joint. Posterior portal was then created along the posterior aspect  of the greater trochanter. This was also done under fluoroscopic guidance  with neutral rotation of the foot.  An outflow cannula was established under  direct visualization in this manner. The articular cartilage on the femoral  head  was intact. There were no loose bodies. The labrum was also inspected  and also found to be intact. No source of the clicking was identified from  the intra-articular portion of the hip.  At this time, the scope was removed  from the portals. They were closed using interrupted inverted 3-0 Vicryl and  3-0 nylon sutures.  An Collier Flowers was then placed over the hip joint. An incision  was then made one fingerbreadth below the anterior superior iliac crest  extending about 4 cm distal and obliquely and 4 cm proximal and obliquely  from the anterior superior iliac crest. The skin and subcutaneous tissue  were sharply divided. The division between the sartorius and tensor fascia  lata was identified. Cutaneous nerve was mobilized and retracted proximally.  Blunt dissection was then performed between the rectum and gluteus medius.  The femoral nerve was palpated and visualized and protected.  Using  a deep  tissue retractors and continued blunt dissection, tendonous portion of the  iliopsoas was identified. It was fractionally lengthened at the  musculotendinous junction under direct visualization with the femoral nerve  protected.  It was quite tight. Following fractional lengthening, the  femoral nerve was again inspected and found to be intact.  The iliopsoas was  noted to be lengthened in situ.  Incision  was thoroughly irrigated. This was closed using interrupted inverted 3-0  Vicryl suture and skin staples.  The patient tolerated the procedure well  without immediate complications.  An impervious dressing was placed. He was  transferred to the recovery room in stable condition.                                               Burnard Bunting, M.D.    GSD/MEDQ  D:  08/26/2002  T:  08/26/2002  Job:  [id number]

## 2010-11-18 NOTE — Discharge Summary (Signed)
NAMEWILFRID, [name]              ACCOUNT NO.:  [id number]   MEDICAL RECORD NO.:  [id number]          PATIENT TYPE:  INP   LOCATION:  1611                         FACILITY:  [facility]   PHYSICIAN:  Corinna L. Lendell Caprice, MDDATE OF BIRTH:  [birth year]   DATE OF ADMISSION:  12/15/2005  DATE OF DISCHARGE:                                 DISCHARGE SUMMARY   DISCHARGE DIAGNOSES:  1.  Diabetic ketoacidosis.  2.  New onset diabetes.  3.  Seizure disorder.  4.  Hemorrhoids.   DISCHARGE MEDICATIONS:  1.  Lantus insulin 24 units subcutaneously q.h.s.  2.  Amaryl 8 mg p.o. daily.  3.  Metformin 500 mg p.o. b.i.d. for a week and then 1000 mg p.o. b.i.d.   He may continue his previous medications.   FOLLOW UP:  With primary care physician of his choice within a week.   ACTIVITY:  Ad lib.   CONDITION:  Stable.   CONSULTATIONS:  None.   PROCEDURES:  None.   DIET:  Should be diabetic.   PERTINENT LABORATORY DATA:  On admission, his pH was 7.343, pCO2 30, pO2 97,  bicarbonate 16, base deficit 9, oxygen saturation 97% on room air.  Sodium  124, potassium 4.9, chloride 92, bicarbonate 18, BUN 16, creatinine 1.3,  glucose 475, anion gap 14.  Serum acetone small.  Urinalysis:  Greater than  80 ketones, greater than 1000 glucose.  Hemoglobin 15, hematocrit 47.  Chest  x-ray:  Bronchitic changes. EKG:  Normal sinus rhythm.  On June 17, his  sodium was 136, potassium 3.4, chloride 103, bicarbonate 26, glucose 229,  BUN 5, creatinine 0.7.   HISTORY AND HOSPITAL COURSE:  [name] is a pleasant 45 year old unassigned  black male who presented to the emergency room with polyuria, polydipsia,  weight loss and fatigue for several weeks.  He has no previous history of  diabetes.  Please see H&P for complete details.  His vital signs were very  fairly unremarkable.  He had dry mucous membranes.  The patient was started  on an insulin drip and admitted to the floor.  He was given diabetic  teaching and  taught how to monitor his own glucose and inject insulin.  He  was able to be transitioned off the insulin drip and started on Lantus,  Amaryl and metformin.  His blood glucoses ranged from 100-300 at discharge.Marland Kitchen  He may at some point be able to be controlled on oral medications alone, but  he will need to follow up with primary care physician within a week.  He at  this time is deciding whether he wants to go  to [facility], [facility], or one of his parent's physicians.  I have arranged a  home glucose monitor as well as outpatient diabetes classes.  At discharge,  his strength was good and he felt comfortable with monitoring his blood  glucoses and self insulin injection.   During his hospitalization, he had a lot of rectal pain and there was some  blood.  He felt it was hemorrhoids.  CT of the pelvis was done to  rule out  perirectal abscess and this was negative.  He was started on ProctoFoam  which helped his symptoms.   He had no problems with blood pressure or seizures during this  hospitalization.  Total time on the day of discharge is 40 minutes.      Corinna L. Lendell Caprice, MD  Electronically Signed     CLS/MEDQ  D:  12/18/2005  T:  12/19/2005  Job:  [id number]

## 2010-11-18 NOTE — Op Note (Signed)
NAME:  [name], [name] NO.:  [id number]   MEDICAL RECORD NO.:  [id number]                   PATIENT TYPE:  INP   LOCATION:  2899                                 FACILITY:  [facility]   PHYSICIAN:  Donalee Citrin, M.D.                     DATE OF BIRTH:  [birth year]   DATE OF PROCEDURE:  12/02/2003  DATE OF DISCHARGE:                                 OPERATIVE REPORT   PREOPERATIVE DIAGNOSES:  1. Ruptured disk, L4-5, L5-S1.  2. Degenerative disk disease, L4-5 and L5-S1, with lumbar stenosis and     severe mechanical diskogenic low back pain.   POSTOPERATIVE DIAGNOSES:  1. Ruptured disk, L4-5, L5-S1.  2. Degenerative disk disease, L4-5 and L5-S1, with lumbar stenosis and     severe mechanical diskogenic low back pain.   OPERATION PERFORMED:  1. Decompressive laminectomy, L4-L5, L5-S1.  2. Posterior lumbar interbody fusion, L4-5, L5-S1 using 12 x 26 mm tangent     allograft at L4-5 and 10 x 25 mm tangent allograft at L5-S1.  3.     Posterolateral arthrodesis L4 to S1 using locally harvested autograft,     pedicle screw instrumentation, L4 to S1, using the M10 C Horizon rod and     pedicle system.  3. Placement of medium Hemovac drain.   SURGEON:  Donalee Citrin, M.D.   ASSISTANT:  Tia Alert, MD   ANESTHESIA:  General endotracheal.   INDICATIONS FOR PROCEDURE:  The patient is a very pleasant 45 year old  gentleman who has had longstanding back and bilateral leg pain radiating  down to the top of his foot, anterior shin, bottom of his foot, limiting his  ambulation. Patient refractory to all forms of conservative treatment.  Preoperative imaging showed severe degenerative disk disease at L5-S1with  degenerative collapse and end plate changes and a large central disk bulge  at L4-5.  The patient underwent preoperative diskography that showed  strongly concordant back pain reproducible at L4-5 and L5-S1 with annular  tears as well as a normal response at L3-4.   Due to the patient's  symptomatology, failure of conservative treatment and results of discogram  and MRI, the patient was recommended two level posterior lumbar interbody  fusion.  I extensive went over the risks and benefits of surgery with him.  He understands and agrees to proceed forward.   DESCRIPTION OF PROCEDURE:  The patient was brought to the operating room  where he was induced under general anesthesia, placed prone on the Wilson  frame, Back prepped in usual fashion.  Midline incision made after  localization with C-arm to confirm the L4-5 disk space.  Bovie  electrocautery was used to dissect subcutaneous tissues.  Subperiosteal  dissection carried out lamina of L4, 5 and S1 bilaterally exposing the  transverse processes of L4-5 and S1 bilaterally.  Intraoperative x-ray  confirmed mobilization of the  L5 transverse process  and using Leksell  rongeur, the spinous process of L5 and L4 were removed and complete medial  facetectomies were performed.  The L4 nerve root was identified and found to  be decompressed out the neuroforamen bilaterally as well as the L5 nerve  root.  There was noted to be a large central disk rupture at L5  causing  severe foraminal stenosis of both the L4 and L5 nerve roots bilaterally and  the L5-S1 disk space was opened up with complete medial facetectomies at L5-  S1 decompressing both the 5 and 1 roots distally out their neural foramina.  Then using D'Errico nerve root retractor we reflected the left L5 nerve root  medially.  The epidural veins coagulated.  The disk space was entered.  The  size 12 distractor was inserted.  Fluoroscopy confirmed good apposition of  the end plates.  Then on the right side, the L5 nerve root was reflected and  the disk spaces were adequately cleaned out.  Several large fragments of  disk were removed from the central compartment. A size 12 cutter and chisel  were used to prepare the end plates.  A 12 x 26 standard  allograft was  inserted on right side.  On the left side, several large fragments of disk  were removed from the central compartment and in a similar fashion, the end  plates were scraped with a cutter and chisel.  Then a locally harvested  allograft was packed against the allograft on the right side after the  endplates centrally were scraped down with a downgoing Epstein curet.  Then  the 12 x 26 mm tangent allograft was inserted on the left side.  Procedure  was repeated at L5-S1.  The disk was noted to be markedly degenerated and  collapsed.  The 10 distractor was used to open the disk space and had good  apposition of the end plates, so the  10 x 26 mm tangent allografts inserted  in similar fashion on the right and left side with locally harvested  allograft in the middle.  Fluoroscopy confirmed depth and trajectory at each  step along the way and postoperative position.  Attention then taken to  pedicle screw placement, pilot holes drilled with the L4 pedicle on the left  using bony landmarks and palpation of the pedicle as well as the canal.  Then the pedicle was cannulated, probed, tapped with 5.5 tap,  6.5 x 45  pedicle screw inserted in the left side at L4.  Pedicle was probed within  the pedicle as well as somewhat in the canal confirming no medial and  lateral breach.  The pedicles had excellent purchase and fluoroscopy  confirmed good depth.  This procedure was repeated at L5 and S1 with a 6 x  40 at L5 and a 6 x 35 at S1.  Again this procedure was repeated on the right  side with a 6 x 45 on the right at L4, 6 x 40 at L5 and 6 x 35 at S1.  All  pedicles again were probed from within the pedicle and from within the canal  and noted to have no medial and lateral breech.  After all six pedicle  screws were in place, the wound was copiously irrigated.  Aggressive  decortication was carried out of the transverse processes bilaterally. Remainder of locally harvested allograft was  packed in the lateral gutters,  along the transverse processes and the 7-mm rod was sized, selected and  inserted on  the right side with an 80 on the left.  Pedical screw nuts  tightened down at S1 and the L5 pedicle screws were compressed against S1  and the L4 compressed against L5.  All of the neural foramina were re-  explored after this and noted to be widely patent.  Both the L4-5 and S1  nerve roots both side.  Then 422 cross-link was inserted.  Postoperative  fluoroscopy confirmed good position of the screws, rods and bone grafts.  Gelfoam was overlaid on top of the dura, along the nerve roots.  Medium  Hemovac drain was placed.  After meticulous hemostasis was maintained, the  muscle and fascia reapproximated with 0 interrupted Vicryl.  The  subcutaneous tissue closed with 2-0 interrupted Vicryl and the skin closed  with a running 4-0 subcuticular.  Benzoin and Steri-Strips applied.  The  patient was then transferred to the recovery room in stable condition.  At  the end of the case, sponge, needle and instrument counts were correct.                                               Donalee Citrin, M.D.    GC/MEDQ  D:  12/02/2003  T:  12/02/2003  Job:  [id number]

## 2010-11-18 NOTE — Discharge Summary (Signed)
NAMELEBARON, BAUTCH              ACCOUNT NO.:  [id number]   MEDICAL RECORD NO.:  [id number]          PATIENT TYPE:  INP   LOCATION:  1611                         FACILITY:  [facility]   PHYSICIAN:  Corinna L. Lendell Caprice, MDDATE OF BIRTH:  Jul 28, 1965   DATE OF ADMISSION:  12/15/2005  DATE OF DISCHARGE:  12/18/2005                                 DISCHARGE SUMMARY   Please disregard this dictation.      Corinna L. Lendell Caprice, MD  Electronically Signed     CLS/MEDQ  D:  12/27/2005  T:  12/28/2005  Job:  [id number]

## 2010-11-18 NOTE — Discharge Summary (Signed)
NAME:  [name], [name] NO.:  [id number]   MEDICAL RECORD NO.:  [id number]                   PATIENT TYPE:  INP   LOCATION:  3007                                 FACILITY:  [facility]   PHYSICIAN:  Donalee Citrin, M.D.                     DATE OF BIRTH:  1966-03-01   DATE OF ADMISSION:  12/02/2003  DATE OF DISCHARGE:  12/05/2003                                 DISCHARGE SUMMARY   ADMISSION DIAGNOSIS:  Lumbar spinal stenosis and severe degenerative disc  disease L4-L5 and L5-S1.   PROCEDURE:  Decompressive laminectomy, posterior lumbar interbody fusion L5-  S1.   SURGEON:  Donalee Citrin, M.D.   ASSISTANT:  Tia Alert, MD   HOSPITAL COURSE:  The patient is a very pleasant 45 year old gentleman who  is admitted, went to the operating room, and underwent the aforementioned  procedure.  Postop, the patient went very well and was taken to the floor.  On the floor, he was afebrile with stable vital signs with complete  resolution of his preoperative leg pain.  He was progressively mobilized  over the next couple of days.  His Foley catheter was able to be taken out  on hospital day one.  His Hemovac was taken out on the morning of hospital  day two.  His PCA was able to be discontinued.  He is voiding spontaneously,  ambulating with physical therapy, and tolerating a regular diet.  By  hospital day three, the patient was stable for discharge home.  He was  discharged for follow up in two weeks on p.o. pain medicine.                                                Donalee Citrin, M.D.    GC/MEDQ  D:  01/29/2004  T:  01/29/2004  Job:  [id number]

## 2010-11-18 NOTE — H&P (Signed)
NAME:  [name], [name]              ACCOUNT NO.:  [id number]   MEDICAL RECORD NO.:  [id number]          PATIENT TYPE:  EMS   LOCATION:  ED                           FACILITY:  [facility]   PHYSICIAN:  Melissa L. Ladona Ridgel, MD  DATE OF BIRTH:  06/05/1966   DATE OF ADMISSION:  12/15/2005  DATE OF DISCHARGE:                                HISTORY & PHYSICAL   CHIEF COMPLAINT:  Polyuria, polydipsia, and tired x2 weeks.   HISTORY OF PRESENT ILLNESS:  The patient is a 45 year old African American  male who says that two weeks ago he noticed increased urination, increased  thirst, increased fatigue, headache, nausea and vomiting intermittently.  The patient stated that he let it go thinking that he was having the flu  because he also had muscle aches.  He said today, however, he started to  have continued diarrhea and worsening of his symptoms.  He also noticed some  blood in his stool, at least on the paper when he was wiping the loose  stool.  He states that two weeks ago, his weight was 250 pounds, but here in  the emergency room, is 193.  He states that he is not sleeping secondary to  the increased urination.  He decided to come to the emergency room  this  morning when he saw the blood and developed burning on urination.  Further  review of systems, he may have had fever but did not measure it.  He did  have chills.  He has been having trouble taking deep breaths which cause him  to feel that he cannot catch enough air and then he would cough.  He has  noticed weight loss and he states that he has positive gland swelling in his  neck.  He has chronic pain in his back which is at baseline.  So, he decided  to self-treat with Thera-Flu thinking this was the flu.   PAST MEDICAL HISTORY:  Seizure disorder for which he takes Depakote.  His  last seizure was seven months ago.  He has dermoid lupus.  He has been told  in the past he has hypertension when he goes to the office, but he has not  been  placed on any medications.  He has not been diagnosed with diabetes in  the past.   PAST SURGICAL HISTORY:  He had back surgery, wrist surgery, and right hip  surgery.   FAMILY HISTORY:  Mom and dad are both living.  Dad has diabetes and  hypertension.  Mom has diabetes and coronary artery disease.   SOCIAL HISTORY:  He smokes about a pack a day and occasionally drinks  alcohol.  He does admit to using marijuana.  He denies any cocaine or heroin  use.   MEDICATIONS:  Depakote, dose unknown, Vicodin, dose unknown.   ALLERGIES:  PERCOCET.   PHYSICAL EXAMINATION:  VITAL SIGNS:  Temperature 97.3, blood pressure 148/82, pulse 74, respiratory  rate 20, saturation 96% on room air.  GENERAL:  Well developed, well nourished, African American male in no acute  distress.  HEENT:  Normocephalic, atraumatic, pupils  equal, round, reactive to light,  extraocular movements intact, mucous membranes are dry.  NECK:  Supple, no JVD, no lymph nodes, no carotid bruits.  CHEST:  Occasional coarse rhonchi with no wheezes.  CARDIOVASCULAR:  Regular rate and rhythm, positive S1 and S2, no S3 or S4,  no murmurs, gallops, and rubs.  ABDOMEN:  Soft, nontender, nondistended, positive bowel sounds.  EXTREMITIES:  No cyanosis, clubbing, and edema.  NEUROLOGICAL:  He is awake, alert and oriented.  Cranial nerves 2-12 are  intact.  Power 5/5.  DTRs are 2.   LABORATORY DATA:  Urinalysis shows no leukocyte esterase, no nitrate, but he  does have greater than 80 mg per dl of ketones and greater than 1000  glucose.  Sodium 124, potassium 4.9, chloride 92, CO2 18, BUN 16, creatinine  1.3, glucose 475, gap 14.  Hemoglobin 15.8, hematocrit 47.7, platelets 322.  Chest x-ray has been viewed and shows only bronchitic changes without  infiltrate.  CBGs in the emergency room  reveals he had initially 426, he  was given 8 units regular insulin around 7 p.m. which brought him down to  360.  EKG is normal sinus rhythm with  80 beats per minute and questionable  borderline LVH.   ASSESSMENT AND PLAN:  This is a 45 year old African American male with a  history of dermoid lupus, chronic back pain, and seizure disorder, who  presents with polyuria, polydipsia, and today burning urination and the  question of blood on the toilet paper or in the stool.  He has been having  loose stools off and on for two weeks.  In the emergency room , he was found  to be hyperglycemic with a gap and ketones in his urine.   1.  Endocrine:  DKA versus hyperosmolar state.  Will check an ABG as I      suspect this is probably DKA.  Will start the Glucomander protocol and      check a hemoglobin A1C and a fasting lipid panel.  We will try to start      him on oral agents in the a.m. if he will tolerate this.  2.  Cardiovascular:  He has a history of his heart pound and dizziness with      standing.  I suspect this represents orthostasis from his polyuria.  He      has borderline LVH, I would suggest an echo at some point when stable,      whether that be as an outpatient or inpatient.  3.  Pulmonary:  He complains of mild shortness of breath with cough and      inability to take very deep breaths which causes him to cough.  X-ray      shows bronchitic changes with a low grade temperature.  I will go ahead      and start him on Avelox and incentive spirometry.  We can consider      adding nebulizers if necessary and we will also check a D-dimer.  Will      order tobacco cessation counseling.  4.  Neurological:  The patient has a history of seizure disorder.  Will      check a Depakote level, continue Depakote as per his home dose.  The      patient will call home and obtain the value for [place].  5.  GU:  Polyuria secondary to DKA, no evidence for infection at this time.  6.  GI:  Nausea and vomiting, we  will treat him symptomatically with Zofran      and add a proton pump inhibitor. 7.  Care management will assist with finding a  primary care physician.      Melissa L. Ladona Ridgel, MD  Electronically Signed     MLT/MEDQ  D:  12/15/2005  T:  12/15/2005  Job:  [id number]

## 2011-03-02 ENCOUNTER — Other Ambulatory Visit: Payer: Self-pay | Admitting: Neurosurgery

## 2011-03-02 ENCOUNTER — Ambulatory Visit
Admission: RE | Admit: 2011-03-02 | Discharge: 2011-03-02 | Disposition: A | Discharge status: Home / Self Care | Payer: Medicare Other | Source: Ambulatory Visit | Attending: Neurosurgery | Admitting: Neurosurgery

## 2011-03-02 DIAGNOSIS — M545 Low back pain: Secondary | ICD-10-CM

## 2011-04-10 LAB — I-STAT 8, (EC8 V) (CONVERTED LAB)
Acid-Base Excess: 5 — ABNORMAL HIGH
Chloride: 100
HCT: 42
Hemoglobin: 14.3
Potassium: 5
Sodium: 136
pH, Ven: 7.404 — ABNORMAL HIGH

## 2011-04-10 LAB — OCCULT BLOOD X 1 CARD TO LAB, STOOL: Fecal Occult Bld: NEGATIVE

## 2011-04-10 LAB — POCT I-STAT CREATININE: Creatinine, Ser: 1.1

## 2011-04-11 LAB — TYPE AND SCREEN
ABO/RH(D): O POS
Antibody Screen: NEGATIVE

## 2011-04-11 LAB — BASIC METABOLIC PANEL
BUN: 14
BUN: 8
Calcium: 9.5
Creatinine, Ser: 0.97
Creatinine, Ser: 1.11
GFR calc non Af Amer: >60
GFR calc non Af Amer: >60
Glucose, Bld: 136 — ABNORMAL HIGH
Glucose, Bld: 98
Potassium: 4.1

## 2011-04-11 LAB — DIFFERENTIAL
Basophils Absolute: 0
Basophils Relative: 0
Eosinophils Absolute: 0 — ABNORMAL LOW
Eosinophils Absolute: 0 — ABNORMAL LOW
Eosinophils Relative: 0
Lymphocytes Relative: 8 — ABNORMAL LOW
Lymphs Abs: 1
Monocytes Relative: 11
Neutro Abs: 8.9 — ABNORMAL HIGH
Neutrophils Relative %: 78 — ABNORMAL HIGH
Neutrophils Relative %: 86 — ABNORMAL HIGH

## 2011-04-11 LAB — CBC
HCT: 38.9 — ABNORMAL LOW
Hemoglobin: 9.3 — ABNORMAL LOW
MCHC: 32.8
MCV: 85.3
MCV: 85.9
Platelets: 206
Platelets: 216
RBC: 3.32 — ABNORMAL LOW
RDW: 13.1
RDW: 13.3
RDW: 13.3
WBC: 12 — ABNORMAL HIGH
WBC: 4.2

## 2011-04-11 LAB — ABO/RH: ABO/RH(D): O POS

## 2011-04-11 LAB — PROTIME-INR
INR: 1
Prothrombin Time: 13.5

## 2011-04-11 LAB — PREPARE PLATELET PHERESIS

## 2011-07-09 ENCOUNTER — Other Ambulatory Visit (HOSPITAL_COMMUNITY): Payer: Self-pay | Admitting: Internal Medicine

## 2011-07-09 ENCOUNTER — Ambulatory Visit (HOSPITAL_COMMUNITY)
Admission: RE | Admit: 2011-07-09 | Discharge: 2011-07-09 | Disposition: A | Discharge status: Home / Self Care | Payer: Medicare Other | Source: Ambulatory Visit | Attending: Internal Medicine | Admitting: Internal Medicine

## 2011-07-09 DIAGNOSIS — R05 Cough: Secondary | ICD-10-CM | POA: Insufficient documentation

## 2011-07-09 DIAGNOSIS — R059 Cough, unspecified: Secondary | ICD-10-CM | POA: Insufficient documentation

## 2011-07-09 DIAGNOSIS — E119 Type 2 diabetes mellitus without complications: Secondary | ICD-10-CM | POA: Insufficient documentation

## 2011-07-09 DIAGNOSIS — F17200 Nicotine dependence, unspecified, uncomplicated: Secondary | ICD-10-CM | POA: Insufficient documentation

## 2011-07-25 ENCOUNTER — Other Ambulatory Visit: Payer: Self-pay | Admitting: Neurosurgery

## 2011-07-25 ENCOUNTER — Ambulatory Visit
Admission: RE | Admit: 2011-07-25 | Discharge: 2011-07-25 | Disposition: A | Discharge status: Home / Self Care | Payer: Medicare Other | Source: Ambulatory Visit | Attending: Neurosurgery | Admitting: Neurosurgery

## 2011-07-25 DIAGNOSIS — M545 Low back pain: Secondary | ICD-10-CM

## 2011-07-25 DIAGNOSIS — M79609 Pain in unspecified limb: Secondary | ICD-10-CM

## 2011-11-28 ENCOUNTER — Other Ambulatory Visit: Payer: Self-pay | Admitting: Neurosurgery

## 2011-11-28 DIAGNOSIS — M5030 Other cervical disc degeneration, unspecified cervical region: Secondary | ICD-10-CM

## 2011-12-01 ENCOUNTER — Ambulatory Visit
Admission: RE | Admit: 2011-12-01 | Discharge: 2011-12-01 | Disposition: A | Discharge status: Home / Self Care | Payer: Medicare Other | Source: Ambulatory Visit | Attending: Neurosurgery | Admitting: Neurosurgery

## 2011-12-01 DIAGNOSIS — M5030 Other cervical disc degeneration, unspecified cervical region: Secondary | ICD-10-CM

## 2011-12-05 ENCOUNTER — Other Ambulatory Visit: Payer: Self-pay | Admitting: Neurosurgery

## 2011-12-08 ENCOUNTER — Encounter (HOSPITAL_COMMUNITY): Payer: Self-pay | Admitting: Respiratory Therapy

## 2011-12-08 NOTE — Pre-Procedure Instructions (Signed)
20 Jettson Crable.  12/08/2011   Your procedure is scheduled on:  Wednesday, June 19th.  Report to [facility] at 1200 noon.  Call this number if you have problems the morning of surgery: [phone]   Remember:   Do not eat food:After Midnight.  May have clear liquids: up to 4 Hours before arrival. (8:00am)  Clear liquids include soda, tea, black coffee, apple or grape juice, broth.  Take these medicines the morning of surgery with A SIP OF WATER: Atenolol (Temormin), Amlodipine (Norvasc), Gabapentin (Neurotin). May take Diazepam (Valium) and Acetaminophen (Tylenol).  May use Albuterol inhaler and bring Inhaler with you on day of surgery.   Do not wear jewelry, make-up or nail polish.  Do not wear lotions, powders, or perfumes. You may wear deodorant.  Do not shave 48 hours prior to surgery. Men may shave face and neck.  Do not bring valuables to the hospital.  Contacts, dentures or bridgework may not be worn into surgery.  Leave suitcase in the car. After surgery it may be brought to your room.  For patients admitted to the hospital, checkout time is 11:00 AM the day of discharge.   Patients discharged the day of surgery will not be allowed to drive home.  Name and phone number of your driver: NA  Special Instructions: CHG Shower Use Special Wash: 1/2 bottle night before surgery and 1/2 bottle morning of surgery.   Please read over the following fact sheets that you were given: Pain Booklet, Coughing and Deep Breathing, MRSA Information and Surgical Site Infection Prevention

## 2011-12-11 ENCOUNTER — Encounter (HOSPITAL_COMMUNITY): Payer: Self-pay | Admitting: *Deleted

## 2011-12-15 ENCOUNTER — Encounter (HOSPITAL_COMMUNITY)
Admission: RE | Admit: 2011-12-15 | Discharge: 2011-12-15 | Disposition: A | Discharge status: Home / Self Care | Payer: Medicare Other | Source: Ambulatory Visit | Attending: Neurosurgery | Admitting: Neurosurgery

## 2011-12-15 LAB — BASIC METABOLIC PANEL
CO2: 28 mEq/L (ref 19–32)
Calcium: 9.4 mg/dL (ref 8.4–10.5)
Creatinine, Ser: 0.99 mg/dL (ref 0.50–1.35)
Glucose, Bld: 92 mg/dL (ref 70–99)
Sodium: 142 mEq/L (ref 135–145)

## 2011-12-15 LAB — CBC
MCH: 28.9 pg (ref 26.0–34.0)
MCV: 85.8 fL (ref 78.0–100.0)
Platelets: 247 10*3/uL (ref 150–400)
RBC: 4.57 MIL/uL (ref 4.22–5.81)

## 2011-12-15 LAB — SURGICAL PCR SCREEN
MRSA, PCR: NEGATIVE
Staphylococcus aureus: NEGATIVE

## 2011-12-15 NOTE — Progress Notes (Signed)
Erie Noe advised that orders are not signed by dr cram.

## 2011-12-19 MED ORDER — DEXAMETHASONE SODIUM PHOSPHATE 10 MG/ML IJ SOLN
10.0000 mg | INTRAMUSCULAR | Status: AC
  Administered 2011-12-20: 10 mg via INTRAVENOUS
  Filled 2011-12-19: qty 1

## 2011-12-19 MED ORDER — CEFAZOLIN SODIUM-DEXTROSE 2-3 GM-% IV SOLR
2.0000 g | INTRAVENOUS | Status: AC
  Administered 2011-12-20: 2 g via INTRAVENOUS
  Filled 2011-12-19: qty 50

## 2011-12-20 ENCOUNTER — Encounter (HOSPITAL_COMMUNITY): Payer: Self-pay | Admitting: Anesthesiology

## 2011-12-20 ENCOUNTER — Inpatient Hospital Stay (HOSPITAL_COMMUNITY)
Admission: RE | Admit: 2011-12-20 | Discharge: 2011-12-21 | DRG: 473 | Disposition: A | Discharge status: Home / Self Care | Payer: Medicare Other | Source: Ambulatory Visit | Attending: Neurosurgery | Admitting: Neurosurgery

## 2011-12-20 ENCOUNTER — Inpatient Hospital Stay (HOSPITAL_COMMUNITY): Payer: Medicare Other | Admitting: Anesthesiology

## 2011-12-20 ENCOUNTER — Encounter (HOSPITAL_COMMUNITY)
Admission: RE | Disposition: A | Discharge status: Home / Self Care | Payer: Self-pay | Source: Ambulatory Visit | Attending: Neurosurgery

## 2011-12-20 ENCOUNTER — Encounter (HOSPITAL_COMMUNITY): Payer: Self-pay | Admitting: Surgery

## 2011-12-20 ENCOUNTER — Inpatient Hospital Stay (HOSPITAL_COMMUNITY): Payer: Medicare Other

## 2011-12-20 DIAGNOSIS — J4489 Other specified chronic obstructive pulmonary disease: Secondary | ICD-10-CM | POA: Diagnosis present

## 2011-12-20 DIAGNOSIS — Z01812 Encounter for preprocedural laboratory examination: Secondary | ICD-10-CM

## 2011-12-20 DIAGNOSIS — I10 Essential (primary) hypertension: Secondary | ICD-10-CM | POA: Diagnosis present

## 2011-12-20 DIAGNOSIS — E119 Type 2 diabetes mellitus without complications: Secondary | ICD-10-CM | POA: Diagnosis present

## 2011-12-20 DIAGNOSIS — M47812 Spondylosis without myelopathy or radiculopathy, cervical region: Principal | ICD-10-CM | POA: Diagnosis present

## 2011-12-20 DIAGNOSIS — Z79899 Other long term (current) drug therapy: Secondary | ICD-10-CM

## 2011-12-20 DIAGNOSIS — L930 Discoid lupus erythematosus: Secondary | ICD-10-CM | POA: Diagnosis present

## 2011-12-20 DIAGNOSIS — J449 Chronic obstructive pulmonary disease, unspecified: Secondary | ICD-10-CM | POA: Diagnosis present

## 2011-12-20 HISTORY — PX: ANTERIOR CERVICAL DECOMP/DISCECTOMY FUSION: SHX1161

## 2011-12-20 HISTORY — DX: Unspecified convulsions: R56.9

## 2011-12-20 HISTORY — DX: Chronic obstructive pulmonary disease, unspecified: J44.9

## 2011-12-20 HISTORY — DX: Adverse effect of unspecified anesthetic, initial encounter: T41.45XA

## 2011-12-20 HISTORY — DX: Discoid lupus erythematosus: L93.0

## 2011-12-20 HISTORY — DX: Other complications of anesthesia, initial encounter: T88.59XA

## 2011-12-20 HISTORY — DX: Essential (primary) hypertension: I10

## 2011-12-20 HISTORY — DX: Pneumonia, unspecified organism: J18.9

## 2011-12-20 LAB — GLUCOSE, CAPILLARY
Glucose-Capillary: 123 mg/dL — ABNORMAL HIGH (ref 70–99)
Glucose-Capillary: 117 mg/dL — ABNORMAL HIGH (ref 70–99)
Glucose-Capillary: 183 mg/dL — ABNORMAL HIGH (ref 70–99)

## 2011-12-20 SURGERY — ANTERIOR CERVICAL DECOMPRESSION/DISCECTOMY FUSION 2 LEVELS
Anesthesia: General | Wound class: Clean

## 2011-12-20 MED ORDER — ONDANSETRON HCL 4 MG/2ML IJ SOLN
INTRAMUSCULAR | Status: DC | PRN
  Administered 2011-12-20: 4 mg via INTRAVENOUS

## 2011-12-20 MED ORDER — THROMBIN 5000 UNITS EX KIT
PACK | CUTANEOUS | Status: DC | PRN
  Administered 2011-12-20 (×2): 5000 [IU] via TOPICAL

## 2011-12-20 MED ORDER — AMLODIPINE BESYLATE 10 MG PO TABS
10.0000 mg | ORAL_TABLET | Freq: Every day | ORAL | Status: DC
  Administered 2011-12-21: 10 mg via ORAL
  Filled 2011-12-20 (×2): qty 1

## 2011-12-20 MED ORDER — ONDANSETRON HCL 4 MG/2ML IJ SOLN: 4.0000 mg | INTRAMUSCULAR | Status: DC | PRN

## 2011-12-20 MED ORDER — ALUM & MAG HYDROXIDE-SIMETH 200-200-20 MG/5ML PO SUSP: 30.0000 mL | Freq: Four times a day (QID) | ORAL | Status: DC | PRN

## 2011-12-20 MED ORDER — ACETAMINOPHEN 500 MG PO TABS: 1000.0000 mg | ORAL_TABLET | Freq: Four times a day (QID) | ORAL | Status: DC | PRN

## 2011-12-20 MED ORDER — INSULIN ASPART 100 UNIT/ML ~~LOC~~ SOLN: 0.0000 [IU] | Freq: Three times a day (TID) | SUBCUTANEOUS | Status: DC

## 2011-12-20 MED ORDER — ACETAMINOPHEN 650 MG RE SUPP: 650.0000 mg | RECTAL | Status: DC | PRN

## 2011-12-20 MED ORDER — BACITRACIN 50000 UNITS IM SOLR
INTRAMUSCULAR | Status: AC
  Filled 2011-12-20: qty 1

## 2011-12-20 MED ORDER — HYDROMORPHONE HCL PF 1 MG/ML IJ SOLN
INTRAMUSCULAR | Status: AC
  Administered 2011-12-20: 0.5 mg via INTRAVENOUS
  Filled 2011-12-20: qty 1

## 2011-12-20 MED ORDER — LIDOCAINE HCL (CARDIAC) 20 MG/ML IV SOLN
INTRAVENOUS | Status: DC | PRN
  Administered 2011-12-20: 100 mg via INTRAVENOUS

## 2011-12-20 MED ORDER — SODIUM CHLORIDE 0.9 % IJ SOLN: 3.0000 mL | Freq: Two times a day (BID) | INTRAMUSCULAR | Status: DC

## 2011-12-20 MED ORDER — LACTATED RINGERS IV SOLN
INTRAVENOUS | Status: DC | PRN
  Administered 2011-12-20: 14:00:00 via INTRAVENOUS

## 2011-12-20 MED ORDER — DIAZEPAM 5 MG PO TABS
5.0000 mg | ORAL_TABLET | Freq: Four times a day (QID) | ORAL | Status: DC | PRN
  Administered 2011-12-20 – 2011-12-21 (×2): 5 mg via ORAL
  Filled 2011-12-20 (×2): qty 1

## 2011-12-20 MED ORDER — PROPOFOL 10 MG/ML IV EMUL
INTRAVENOUS | Status: DC | PRN
  Administered 2011-12-20: 200 mg via INTRAVENOUS

## 2011-12-20 MED ORDER — HYDROMORPHONE HCL PF 1 MG/ML IJ SOLN: 0.5000 mg | INTRAMUSCULAR | Status: DC | PRN

## 2011-12-20 MED ORDER — ATENOLOL 25 MG PO TABS
25.0000 mg | ORAL_TABLET | Freq: Every day | ORAL | Status: DC
  Administered 2011-12-21: 25 mg via ORAL
  Filled 2011-12-20 (×2): qty 1

## 2011-12-20 MED ORDER — GLYCOPYRROLATE 0.2 MG/ML IJ SOLN
INTRAMUSCULAR | Status: DC | PRN
  Administered 2011-12-20: 0.4 mg via INTRAVENOUS

## 2011-12-20 MED ORDER — PHENOL 1.4 % MT LIQD
1.0000 | OROMUCOSAL | Status: DC | PRN
  Administered 2011-12-21: 1 via OROMUCOSAL
  Filled 2011-12-20: qty 177

## 2011-12-20 MED ORDER — CEFAZOLIN SODIUM 1-5 GM-% IV SOLN
1.0000 g | Freq: Three times a day (TID) | INTRAVENOUS | Status: AC
  Administered 2011-12-20 – 2011-12-21 (×2): 1 g via INTRAVENOUS
  Filled 2011-12-20 (×2): qty 50

## 2011-12-20 MED ORDER — ALBUTEROL SULFATE HFA 108 (90 BASE) MCG/ACT IN AERS
2.0000 | INHALATION_SPRAY | Freq: Four times a day (QID) | RESPIRATORY_TRACT | Status: DC | PRN
  Filled 2011-12-20: qty 6.7

## 2011-12-20 MED ORDER — 0.9 % SODIUM CHLORIDE (POUR BTL) OPTIME
TOPICAL | Status: DC | PRN
  Administered 2011-12-20: 1000 mL

## 2011-12-20 MED ORDER — FENTANYL CITRATE 0.05 MG/ML IJ SOLN
INTRAMUSCULAR | Status: DC | PRN
  Administered 2011-12-20: 150 ug via INTRAVENOUS
  Administered 2011-12-20 (×5): 50 ug via INTRAVENOUS

## 2011-12-20 MED ORDER — THROMBIN 5000 UNITS EX SOLR
OROMUCOSAL | Status: DC | PRN
  Administered 2011-12-20: 15:00:00 via TOPICAL

## 2011-12-20 MED ORDER — HYDROCHLOROTHIAZIDE 25 MG PO TABS
25.0000 mg | ORAL_TABLET | Freq: Every day | ORAL | Status: DC
  Administered 2011-12-21: 25 mg via ORAL
  Filled 2011-12-20 (×2): qty 1

## 2011-12-20 MED ORDER — HYDROMORPHONE HCL PF 1 MG/ML IJ SOLN
0.2500 mg | INTRAMUSCULAR | Status: DC | PRN
  Administered 2011-12-20 (×3): 0.5 mg via INTRAVENOUS

## 2011-12-20 MED ORDER — SODIUM CHLORIDE 0.9 % IR SOLN
Status: DC | PRN
  Administered 2011-12-20: 15:00:00

## 2011-12-20 MED ORDER — SODIUM CHLORIDE 0.9 % IJ SOLN: 3.0000 mL | INTRAMUSCULAR | Status: DC | PRN

## 2011-12-20 MED ORDER — VECURONIUM BROMIDE 10 MG IV SOLR
INTRAVENOUS | Status: DC | PRN
  Administered 2011-12-20 (×3): 2 mg via INTRAVENOUS

## 2011-12-20 MED ORDER — METFORMIN HCL 850 MG PO TABS
850.0000 mg | ORAL_TABLET | Freq: Two times a day (BID) | ORAL | Status: DC
  Administered 2011-12-21: 850 mg via ORAL
  Filled 2011-12-20 (×3): qty 1

## 2011-12-20 MED ORDER — GLIMEPIRIDE 2 MG PO TABS
2.0000 mg | ORAL_TABLET | Freq: Every day | ORAL | Status: DC
Start: 2011-12-21 — End: 2011-12-21
  Administered 2011-12-21: 2 mg via ORAL
  Filled 2011-12-20 (×2): qty 1

## 2011-12-20 MED ORDER — DOCUSATE SODIUM 100 MG PO CAPS
100.0000 mg | ORAL_CAPSULE | Freq: Two times a day (BID) | ORAL | Status: DC
  Administered 2011-12-20: 100 mg via ORAL
  Filled 2011-12-20: qty 1

## 2011-12-20 MED ORDER — OXYCODONE-ACETAMINOPHEN 5-325 MG PO TABS
1.0000 | ORAL_TABLET | ORAL | Status: DC | PRN
  Filled 2011-12-20: qty 2

## 2011-12-20 MED ORDER — SODIUM CHLORIDE 0.9 % IV SOLN
250.0000 mL | INTRAVENOUS | Status: DC
  Administered 2011-12-20: 250 mL via INTRAVENOUS

## 2011-12-20 MED ORDER — SODIUM CHLORIDE 0.9 % IV SOLN
INTRAVENOUS | Status: AC
  Filled 2011-12-20: qty 500

## 2011-12-20 MED ORDER — MIDAZOLAM HCL 5 MG/5ML IJ SOLN
INTRAMUSCULAR | Status: DC | PRN
  Administered 2011-12-20: 2 mg via INTRAVENOUS

## 2011-12-20 MED ORDER — CYCLOBENZAPRINE HCL 10 MG PO TABS
10.0000 mg | ORAL_TABLET | Freq: Three times a day (TID) | ORAL | Status: DC | PRN
  Administered 2011-12-20: 10 mg via ORAL
  Filled 2011-12-20: qty 1

## 2011-12-20 MED ORDER — MENTHOL 3 MG MT LOZG
1.0000 | LOZENGE | OROMUCOSAL | Status: DC | PRN
  Filled 2011-12-20: qty 9

## 2011-12-20 MED ORDER — HYDROCODONE-ACETAMINOPHEN 10-325 MG PO TABS
1.0000 | ORAL_TABLET | ORAL | Status: DC | PRN
  Administered 2011-12-20 (×2): 1 via ORAL
  Administered 2011-12-21 (×2): 2 via ORAL
  Filled 2011-12-20 (×2): qty 2
  Filled 2011-12-20 (×2): qty 1

## 2011-12-20 MED ORDER — HEMOSTATIC AGENTS (NO CHARGE) OPTIME
TOPICAL | Status: DC | PRN
  Administered 2011-12-20: 1 via TOPICAL

## 2011-12-20 MED ORDER — NEOSTIGMINE METHYLSULFATE 1 MG/ML IJ SOLN
INTRAMUSCULAR | Status: DC | PRN
  Administered 2011-12-20: 3.5 mg via INTRAVENOUS

## 2011-12-20 MED ORDER — ACETAMINOPHEN 325 MG PO TABS: 650.0000 mg | ORAL_TABLET | ORAL | Status: DC | PRN

## 2011-12-20 MED ORDER — INSULIN ASPART 100 UNIT/ML ~~LOC~~ SOLN: 0.0000 [IU] | Freq: Every day | SUBCUTANEOUS | Status: DC

## 2011-12-20 MED ORDER — ROCURONIUM BROMIDE 100 MG/10ML IV SOLN
INTRAVENOUS | Status: DC | PRN
  Administered 2011-12-20: 50 mg via INTRAVENOUS

## 2011-12-20 SURGICAL SUPPLY — 66 items
ADH SKN CLS APL DERMABOND .7 (GAUZE/BANDAGES/DRESSINGS) ×2
APL SKNCLS STERI-STRIP NONHPOA (GAUZE/BANDAGES/DRESSINGS) ×1
BAG DECANTER FOR FLEXI CONT (MISCELLANEOUS) ×2 IMPLANT
BENZOIN TINCTURE PRP APPL 2/3 (GAUZE/BANDAGES/DRESSINGS) ×2 IMPLANT
BIT DRILL SPINE QC 14 (BIT) ×1 IMPLANT
BRUSH SCRUB EZ PLAIN DRY (MISCELLANEOUS) ×2 IMPLANT
BUR MATCHSTICK NEURO 3.0 LAGG (BURR) ×2 IMPLANT
CANISTER SUCTION 2500CC (MISCELLANEOUS) ×2 IMPLANT
CLOTH BEACON ORANGE TIMEOUT ST (SAFETY) ×2 IMPLANT
CONT SPEC 4OZ CLIKSEAL STRL BL (MISCELLANEOUS) ×2 IMPLANT
DERMABOND ADVANCED (GAUZE/BANDAGES/DRESSINGS) ×2
DERMABOND ADVANCED .7 DNX12 (GAUZE/BANDAGES/DRESSINGS) IMPLANT
DRAIN SNY WOU 7FLT (WOUND CARE) ×1 IMPLANT
DRAPE C-ARM 42X72 X-RAY (DRAPES) ×4 IMPLANT
DRAPE LAPAROTOMY 100X72 PEDS (DRAPES) ×2 IMPLANT
DRAPE MICROSCOPE ZEISS OPMI (DRAPES) ×2 IMPLANT
DRAPE POUCH INSTRU U-SHP 10X18 (DRAPES) ×2 IMPLANT
DRSG OPSITE 4X5.5 SM (GAUZE/BANDAGES/DRESSINGS) ×2 IMPLANT
ELECT COATED BLADE 2.86 ST (ELECTRODE) ×2 IMPLANT
ELECT REM PT RETURN 9FT ADLT (ELECTROSURGICAL) ×2
ELECTRODE REM PT RTRN 9FT ADLT (ELECTROSURGICAL) ×1 IMPLANT
EVACUATOR SILICONE 100CC (DRAIN) ×1 IMPLANT
GAUZE SPONGE 4X4 16PLY XRAY LF (GAUZE/BANDAGES/DRESSINGS) IMPLANT
GLOVE BIO SURGEON STRL SZ 6.5 (GLOVE) ×2 IMPLANT
GLOVE BIO SURGEON STRL SZ8 (GLOVE) ×2 IMPLANT
GLOVE BIOGEL PI IND STRL 6.5 (GLOVE) IMPLANT
GLOVE BIOGEL PI IND STRL 7.0 (GLOVE) IMPLANT
GLOVE BIOGEL PI INDICATOR 6.5 (GLOVE) ×1
GLOVE BIOGEL PI INDICATOR 7.0 (GLOVE) ×2
GLOVE EXAM NITRILE LRG STRL (GLOVE) IMPLANT
GLOVE EXAM NITRILE MD LF STRL (GLOVE) ×1 IMPLANT
GLOVE EXAM NITRILE XL STR (GLOVE) IMPLANT
GLOVE EXAM NITRILE XS STR PU (GLOVE) IMPLANT
GLOVE INDICATOR 8.5 STRL (GLOVE) ×2 IMPLANT
GLOVE SS BIOGEL STRL SZ 6.5 (GLOVE) IMPLANT
GLOVE SUPERSENSE BIOGEL SZ 6.5 (GLOVE) ×2
GOWN BRE IMP SLV AUR LG STRL (GOWN DISPOSABLE) ×2 IMPLANT
GOWN BRE IMP SLV AUR XL STRL (GOWN DISPOSABLE) ×3 IMPLANT
GOWN STRL REIN 2XL LVL4 (GOWN DISPOSABLE) IMPLANT
HEAD HALTER (SOFTGOODS) ×2 IMPLANT
HEMOSTAT POWDER KIT SURGIFOAM (HEMOSTASIS) ×2 IMPLANT
KIT BASIN OR (CUSTOM PROCEDURE TRAY) ×2 IMPLANT
KIT ROOM TURNOVER OR (KITS) ×2 IMPLANT
NDL SPNL 20GX3.5 QUINCKE YW (NEEDLE) ×1 IMPLANT
NEEDLE SPNL 20GX3.5 QUINCKE YW (NEEDLE) ×2 IMPLANT
NS IRRIG 1000ML POUR BTL (IV SOLUTION) ×2 IMPLANT
PACK LAMINECTOMY NEURO (CUSTOM PROCEDURE TRAY) ×2 IMPLANT
PAD ARMBOARD 7.5X6 YLW CONV (MISCELLANEOUS) ×6 IMPLANT
PLATE ANT CERV XTEND 2 LV 30 (Plate) ×1 IMPLANT
PUTTY BONE DBX 2.5 MIS (Bone Implant) ×1 IMPLANT
RUBBERBAND STERILE (MISCELLANEOUS) ×4 IMPLANT
SCREW XTD VAR 4.2 SELF TAP (Screw) ×6 IMPLANT
SPACER COLONIAL 7X14X12 (Spacer) ×1 IMPLANT
SPACER COLONIAL LGE 8MM 7DEG (Spacer) ×1 IMPLANT
SPONGE GAUZE 4X4 12PLY (GAUZE/BANDAGES/DRESSINGS) ×2 IMPLANT
SPONGE INTESTINAL PEANUT (DISPOSABLE) ×2 IMPLANT
SPONGE SURGIFOAM ABS GEL SZ50 (HEMOSTASIS) ×2 IMPLANT
STRIP CLOSURE SKIN 1/2X4 (GAUZE/BANDAGES/DRESSINGS) ×2 IMPLANT
SUT VIC AB 3-0 SH 8-18 (SUTURE) ×2 IMPLANT
SUT VICRYL 4-0 PS2 18IN ABS (SUTURE) ×2 IMPLANT
SYR 20ML ECCENTRIC (SYRINGE) ×2 IMPLANT
TAPE CLOTH 4X10 WHT NS (GAUZE/BANDAGES/DRESSINGS) IMPLANT
TOWEL OR 17X24 6PK STRL BLUE (TOWEL DISPOSABLE) ×2 IMPLANT
TOWEL OR 17X26 10 PK STRL BLUE (TOWEL DISPOSABLE) ×2 IMPLANT
TRAP SPECIMEN MUCOUS 40CC (MISCELLANEOUS) ×2 IMPLANT
WATER STERILE IRR 1000ML POUR (IV SOLUTION) ×2 IMPLANT

## 2011-12-20 NOTE — Transfer of Care (Signed)
Immediate Anesthesia Transfer of Care Note  Patient: [name].  Procedure(s) Performed: Procedure(s) (LRB): ANTERIOR CERVICAL DECOMPRESSION/DISCECTOMY FUSION 2 LEVELS (N/A)  Patient Location: PACU  Anesthesia Type: General  Level of Consciousness: awake, alert , oriented and patient cooperative  Airway & Oxygen Therapy: Patient Spontanous Breathing and Patient connected to nasal cannula oxygen  Post-op Assessment: Report given to PACU RN, Post -op Vital signs reviewed and stable and Patient moving all extremities X 4  Post vital signs: Reviewed and stable  Complications: No apparent anesthesia complications

## 2011-12-20 NOTE — Anesthesia Postprocedure Evaluation (Signed)
  Anesthesia Post-op Note  Patient: [name].  Procedure(s) Performed: Procedure(s) (LRB): ANTERIOR CERVICAL DECOMPRESSION/DISCECTOMY FUSION 2 LEVELS (N/A)  Patient Location: PACU  Anesthesia Type: General  Level of Consciousness: sedated  Airway and Oxygen Therapy: Patient Spontanous Breathing and Patient connected to nasal cannula oxygen  Post-op Pain: none  Post-op Assessment: Post-op Vital signs reviewed, Patient's Cardiovascular Status Stable, Respiratory Function Stable, Patent Airway and No signs of Nausea or vomiting  Post-op Vital Signs: Reviewed and stable  Complications: No apparent anesthesia complications

## 2011-12-20 NOTE — Anesthesia Procedure Notes (Signed)
Procedure Name: Intubation Date/Time: 12/20/2011 2:08 PM Performed by: Jerilee Hoh Pre-anesthesia Checklist: Patient identified, Emergency Drugs available, Suction available and Patient being monitored Patient Re-evaluated:Patient Re-evaluated prior to inductionOxygen Delivery Method: Circle system utilized Preoxygenation: Pre-oxygenation with 100% oxygen Intubation Type: IV induction Ventilation: Mask ventilation without difficulty Laryngoscope Size: Mac and 4 Grade View: Grade III Tube type: Oral Tube size: 7.5 mm Number of attempts: 2 Airway Equipment and Method: Stylet Placement Confirmation: ETT inserted through vocal cords under direct vision,  positive ETCO2 and breath sounds checked- equal and bilateral Secured at: 22 cm Tube secured with: Tape Dental Injury: Teeth and Oropharynx as per pre-operative assessment  Comments: DL with MAC 4 blade, esophageal intubation immediately recognized.  Mask ventilation resumed without difficulty.  DL with MAC 4 blade.  Atraumatic intubation, +EtCO2, + BBS

## 2011-12-20 NOTE — H&P (Signed)
[name]. is an 46 y.o. male.   Chief Complaint: Neck and left greater right arm pain HPI: Patient pleasant 59 gentleman is at long history with his neck and his back of operative back several times he said L2-S1 fusion of this and he presented with worsening neck pain pain rating both shoulders both arms or numbness tingling into the thumb and first thing this displayed intermittent weakness of his bicep and tricep and his pain was refractory to all forms of conservative anti-inflammatories and therapeutic exercises. Imaging findings revealed cervical spondylosis with severe stenosis at C4-5 C5-6 some recommended anterior cervical discectomies and fusion C4-5 C5-6 I extensively reviewed the risks and benefits of the operation with him and his family including and perioperative course expectations of outcome alternatives of surgery. They understand and agree to proceed forward.  Past Medical History  Diagnosis Date  . Hypertension   . Diabetes mellitus   . Pneumonia   . COPD (chronic obstructive pulmonary disease)     chronic Bronchhititis  . Complication of anesthesia   . Seizures     last one 3 years ago.  Md discontinued meds- Dr Anne Hahn  . 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    Past Surgical History  Procedure Date  . Tonsillectomy   . Hip surgery     rods to each hip- hip can ot of socket.  . Wrist surgery     repair due to crush  . Back surgery     5 from degenerative disease.Marland Kitchen      History reviewed. No pertinent family history. Social History:  reports that he has been smoking.  He does not have any smokeless tobacco history on file. He reports that he does not drink alcohol or use illicit drugs.  Allergies:  Allergies  Allergen Reactions  . Onion Itching  . Percocet (Oxycodone-Acetaminophen) Itching and Nausea And Vomiting    Medications Prior to Admission  Medication Sig Dispense Refill  . acetaminophen (TYLENOL) 500 MG tablet Take 1,000 mg by  mouth every 6 (six) hours as needed. For pain      . albuterol (PROVENTIL HFA;VENTOLIN HFA) 108 (90 BASE) MCG/ACT inhaler Inhale 2 puffs into the lungs every 6 (six) hours as needed. For shortness of breath      . amLODipine (NORVASC) 10 MG tablet Take 10 mg by mouth daily.      Marland Kitchen atenolol (TENORMIN) 25 MG tablet Take 25 mg by mouth daily.      . diazepam (VALIUM) 5 MG tablet Take 5 mg by mouth every 8 (eight) hours as needed. For anxiety      . glimepiride (AMARYL) 2 MG tablet Take 2 mg by mouth daily before breakfast.      . hydrochlorothiazide (HYDRODIURIL) 25 MG tablet Take 25 mg by mouth daily.      Marland Kitchen HYDROcodone-acetaminophen (NORCO) 10-325 MG per tablet Take 1-2 tablets by mouth every 4 (four) hours as needed. For pain      . metFORMIN (GLUCOPHAGE) 850 MG tablet Take 850 mg by mouth 2 (two) times daily with a meal.        Results for orders placed during the hospital encounter of 12/20/11 (from the past 48 hour(s))  GLUCOSE, CAPILLARY     Status: Normal   Collection Time   12/20/11 12:09 PM      Component Value Range Comment   Glucose-Capillary 94  70 - 99 mg/dL    No results found.  Review of Systems  Constitutional: Negative.   HENT: Positive for neck pain.   Eyes: Negative.   Respiratory: Negative.   Cardiovascular: Negative.   Gastrointestinal: Negative.   Genitourinary: Negative.   Musculoskeletal: Positive for myalgias and back pain.  Skin: Negative.   Neurological: Positive for sensory change.  Endo/Heme/Allergies: Negative.   Psychiatric/Behavioral: Negative.     Blood pressure 107/66, pulse 59, temperature 97.7 F (36.5 C), temperature source Oral, resp. rate 18, SpO2 100.00%. Physical Exam  Constitutional: He is oriented to person, place, and time. He appears well-developed.  HENT:  Head: Normocephalic.  Eyes: Pupils are equal, round, and reactive to light.  Neck: Normal range of motion.  Cardiovascular: Regular rhythm.   Respiratory: Breath sounds normal.    GI: Bowel sounds are normal.  Neurological: He is alert and oriented to person, place, and time. He has normal strength. GCS eye subscore is 4. GCS verbal subscore is 5. GCS motor subscore is 6.  Reflex Scores:      Tricep reflexes are 2+ on the right side and 2+ on the left side.      Bicep reflexes are 2+ on the right side and 2+ on the left side.      Brachioradialis reflexes are 2+ on the right side and 2+ on the left side.      Patellar reflexes are 2+ on the right side and 2+ on the left side.      Achilles reflexes are 2+ on the right side and 2+ on the left side.      Strength is 5 out of 5 in his deltoids, biceps, triceps, wrist flexion, wrist extension, and intrinsics. Lower extremity strength is also 5 out of 5.     Assessment/Plan 4607 presents for ACDF at C4-5 and C5-6.  [name] P 12/20/2011, 1:49 PM

## 2011-12-20 NOTE — Anesthesia Preprocedure Evaluation (Signed)
Anesthesia Evaluation   Patient awake and Patient confused    Reviewed: Allergy & Precautions, H&P , NPO status , Patient's Chart, lab work & pertinent test results  History of Anesthesia Complications Negative for: history of anesthetic complications  Airway Mallampati: I  Neck ROM: Full    Dental  (+) Missing and Dental Advisory Given   Pulmonary pneumonia , COPD breath sounds clear to auscultation        Cardiovascular hypertension, Rhythm:Regular Rate:Normal     Neuro/Psych Seizures -,     GI/Hepatic   Endo/Other  Diabetes mellitus-, Oral Hypoglycemic Agents  Renal/GU      Musculoskeletal   Abdominal   Peds  Hematology   Anesthesia Other Findings   Reproductive/Obstetrics                           Anesthesia Physical Anesthesia Plan  ASA: III  Anesthesia Plan: General   Post-op Pain Management:    Induction: Intravenous  Airway Management Planned: Oral ETT  Additional Equipment:   Intra-op Plan:   Post-operative Plan: Extubation in OR  Informed Consent: I have reviewed the patients History and Physical, chart, labs and discussed the procedure including the risks, benefits and alternatives for the proposed anesthesia with the patient or authorized representative who has indicated his/her understanding and acceptance.   Dental advisory given  Plan Discussed with: CRNA and Surgeon  Anesthesia Plan Comments:         Anesthesia Quick Evaluation

## 2011-12-20 NOTE — Preoperative (Signed)
Beta Blockers   Reason not to administer Beta Blockers:Not Applicable 

## 2011-12-20 NOTE — Op Note (Signed)
Preoperative diagnosis: Cervical spondylosis with stenosis at C4-5 and C5-6 with biforaminal stenosis of the C5 and C6 nerve roots respectively and left greater right C5 and C6 radiculopathy  Postoperative diagnosis: Same  Procedure: Anterior cervical discectomies and fusion at C4-5 and C5-6 using globus peek cages packed with local autograft mixed with DBX and the and the globus extend plating system with 614 mm variable angle screws  Surgeon: Jillyn Hidden Laszlo Ellerby  Assistant: Shirlean Kelly  Anesthesia: Gen.  EBL: Minimal  History of present illness: Patient very pleasant 45 of them is a progress worsening neck and bilateral arm pain worse on the left consistent with the C5-C6 nerve root pattern workup with MRI scan showed severe cervical spondylosis with stenosis at both levels and bifrontal stenosis of the C5 and C6 nerve roots to this mixture treatment progression of clinical syndrome physical examination he findings patient recommended anterior cervical discectomies and fusion with a wrist nevus of the operation with him he understands and agrees to proceed forward. Also explained perioperative course and expectations of outcome and alternatives to surgery.  Operative procedure: Patient was brought into the or was induced under general anesthesia positioned supine the neck in slight extension in 5 pounds of halter traction the right side his neck and prepped and draped in routine sterile fashion a curvilinear incision was made just off midline to the bursa, mass or just inferior to the thyroid cartilage the superficial layer of the platysma was divided and divided longitudinally the avascular tissue sternomastoid and strap muscles was developed and the prevertebral fascia precautions doesn't with Kitners. Interoperative X. identify the C4-5 disc space level so the annulotomy was made with him as: Displaced laterally both the space levels. Self-retaining retractor was placed and the annulotomy is were  extended anterior disc was removed large anterior aspect of did not flexor rongeur and then using a BA curette spaces were scraped disc material removed. Then using a high-speed drill capturing the bone shavings in a mucous trap disc spaces were drilled down the posterior annulus facet complex. Working first at the C5-6 to space level this was further drilled down and aggressive undergoing of both endplates help identify the PLL which was removed in piecemeal fashion. This exposed the thecal sac March across laterally both C6 pedicles were identified and both C6 nerve roots were skeletonized flush with pedicle aggressive and viable template removed the large posterior aspect of the C5 and C6 to bodies causing severe central stenosis and due to marked facet hypertrophy both C6 nerve roots were also decompressed with removal of these bone spurs. After adequate decompression achieved both centrally and foraminally this is packed with Gelfoam and attention second C4-5. A similar fashion C4-5 was aggressively under bitten a large posterior aspect coming off the C4 vertebral body was also grossly under been marking laterally the pedicle of C5 was identified to C5 nerve roots were skeletonized flush with pedicle. Again uncinate was markedly hypertrophied this is causing marked stenosis in the proximal C5 nerve roots bilaterally this was all under been removed decompress the foramen to after adequate abutting both endplates the central canal was decompressed as well. In a 7 mm cage was packed inserted C4-5 and 8 mm cages packed inserted C6-7. Contouring of the anterior vertebral bodies was carried out to receive the plate and the plate was placed all screws excellent purchase locking mechanism was engaged posterior fluoroscopy confirmed good position of plate screws and implants. The wound scope see her good fixing space was maintained  a J-P drain was placed and the wounds closed in layers with after Vicryl the platysma and  a running 4 septic or and skin. And sensation applied as well as Dermabond and all needle counts and sponge counts were correct per the nurses.

## 2011-12-21 LAB — GLUCOSE, CAPILLARY

## 2011-12-21 MED ORDER — OXYCODONE-ACETAMINOPHEN 5-325 MG PO TABS: 1.0000 | ORAL_TABLET | ORAL | Status: AC | PRN

## 2011-12-21 NOTE — Discharge Instructions (Signed)
Wound Care Keep incision covered and dry for one week.  If you shower prior to then, cover incision with plastic wrap.  You may remove outer bandage after one week and shower.  Do not put any creams, lotions, or ointments on incision. Leave steri-strips on neck.  They will fall off by themselves. Activity Walk each and every day, increasing distance each day. No lifting greater than 5 lbs.  Avoid excessive neck motion. No lifting no bending no twisting no driving or riding a car unless the coming to see me. Wear neck brace at all times except when showering or otherwise instructed. Diet Resume your normal diet.  Return to Work Will be discussed at you follow up appointment. Call Your Doctor If Any of These Occur Redness, drainage, or swelling at the wound.  Temperature greater than 101 degrees. Severe pain not relieved by pain medication. Increased difficulty swallowing.  Incision starts to come apart. Follow Up Appt Call today for appointment in 1-2 weeks ([phone]) or for problems.  If you have any hardware placed in your spine, you will need an x-ray before your appointment.       Marland Kitchen

## 2011-12-21 NOTE — Progress Notes (Signed)
UR COMPLETED  

## 2011-12-21 NOTE — Progress Notes (Signed)
Subjective: Patient reports Is feeling well neck pain sore throat but arm pain significantly improved voiding spontaneously  Objective: Vital signs in last 24 hours: Temp:  [97.7 F (36.5 C)-98.9 F (37.2 C)] 98.6 F (37 C) (06/20 0400) Pulse Rate:  [49-66] 49  (06/20 0400) Resp:  [6-26] 18  (06/20 0400) BP: (107-167)/(66-100) 152/87 mmHg (06/20 0400) SpO2:  [94 %-100 %] 100 % (06/20 0400)  Intake/Output from previous day: 06/19 0701 - 06/20 0700 In: 1572 [I.V.:1522; IV Piggyback:50] Out: 84 [Drains:59; Blood:25] Intake/Output this shift:    Strength out of 5 wound clean and dry  Lab Results: No results found for this basename: WBC:2,HGB:2,HCT:2,PLT:2 in the last 72 hours BMET No results found for this basename: NA:2,K:2,CL:2,CO2:2,GLUCOSE:2,BUN:2,CREATININE:2,CALCIUM:2 in the last 72 hours  Studies/Results: Dg Cervical Spine 1 View  12/20/2011  *RADIOLOGY REPORT*  Clinical Data: Cervical disc herniations.  DG CERVICAL SPINE - 1 VIEW  Comparison: None.  Findings: Intraoperative cross-table lateral radiograph shows interbody fusion plugs and anterior fixation plate and screws in expected position at levels of C4-5 and C5-6.  Alignment is anatomic at these levels.  IMPRESSION: Anterior cervical disc fusion at levels of C4-5 and C5-6, with normal alignment.  Original Report Authenticated By: Danae Orleans, M.D.    Assessment/Plan: To cut his JP drain discharge home  LOS: 1 day     Wendee Hata P 12/21/2011, 7:21 AM

## 2011-12-21 NOTE — Discharge Summary (Signed)
  Physician Discharge Summary  Patient ID: [name]. MRN: [id number] DOB/AGE: 1966/02/20 46 y.o.  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 *    Discharged Condition: good  Hospital Course: Patient was admitted hospital underwent an ACDF at C4-5 C5-6 postoperative patient did very well recovered in the floor on the floor was angling and voiding spontaneously tolerating regular diet wound is clean and dry J-P was taken out he was discharged home  Consults: Significant Diagnostic Studies: Treatments: ACDF C4-5 C5-6 Discharge Exam: Blood pressure 152/87, pulse 49, temperature 98.6 F (37 C), temperature source Oral, resp. rate 18, SpO2 100.00%. Strength out of 5 wound clean and dry  Disposition: Home   Medication List  As of 12/21/2011  7:24 AM   TAKE these medications         acetaminophen 500 MG tablet   Commonly known as: TYLENOL   Take 1,000 mg by mouth every 6 (six) hours as needed. For pain      albuterol 108 (90 BASE) MCG/ACT inhaler   Commonly known as: PROVENTIL HFA;VENTOLIN HFA   Inhale 2 puffs into the lungs every 6 (six) hours as needed. For shortness of breath      amLODipine 10 MG tablet   Commonly known as: NORVASC   Take 10 mg by mouth daily.      atenolol 25 MG tablet   Commonly known as: TENORMIN   Take 25 mg by mouth daily.      diazepam 5 MG tablet   Commonly known as: VALIUM   Take 5 mg by mouth every 8 (eight) hours as needed. For anxiety      glimepiride 2 MG tablet   Commonly known as: AMARYL   Take 2 mg by mouth daily before breakfast.      hydrochlorothiazide 25 MG tablet   Commonly known as: HYDRODIURIL   Take 25 mg by mouth daily.      HYDROcodone-acetaminophen 10-325 MG per tablet   Commonly known as: NORCO   Take 1-2 tablets by mouth every 4 (four) hours  as needed. For pain      metFORMIN 850 MG tablet   Commonly known as: GLUCOPHAGE   Take 850 mg by mouth 2 (two) times daily with a meal.      oxyCODONE-acetaminophen 5-325 MG per tablet   Commonly known as: PERCOCET   Take 1-2 tablets by mouth every 4 (four) hours as needed.             Signed: Kayler Rise P 12/21/2011, 7:24 AM

## 2011-12-21 NOTE — Plan of Care (Signed)
Problem: Consults Goal: Diagnosis - Spinal Surgery Outcome: Completed/Met Date Met:  12/21/11 Cervical Spine Fusion     

## 2011-12-22 ENCOUNTER — Encounter (HOSPITAL_COMMUNITY): Payer: Self-pay | Admitting: Neurosurgery

## 2012-01-07 ENCOUNTER — Encounter (HOSPITAL_COMMUNITY): Payer: Self-pay

## 2012-01-07 ENCOUNTER — Emergency Department (HOSPITAL_COMMUNITY): Payer: Medicare Other

## 2012-01-07 ENCOUNTER — Emergency Department (HOSPITAL_COMMUNITY)
Admission: EM | Admit: 2012-01-07 | Discharge: 2012-01-07 | Disposition: A | Discharge status: Home / Self Care | Payer: Medicare Other | Attending: Emergency Medicine | Admitting: Emergency Medicine

## 2012-01-07 DIAGNOSIS — Z79899 Other long term (current) drug therapy: Secondary | ICD-10-CM | POA: Insufficient documentation

## 2012-01-07 DIAGNOSIS — J4489 Other specified chronic obstructive pulmonary disease: Secondary | ICD-10-CM | POA: Insufficient documentation

## 2012-01-07 DIAGNOSIS — J449 Chronic obstructive pulmonary disease, unspecified: Secondary | ICD-10-CM | POA: Insufficient documentation

## 2012-01-07 DIAGNOSIS — S161XXA Strain of muscle, fascia and tendon at neck level, initial encounter: Secondary | ICD-10-CM

## 2012-01-07 DIAGNOSIS — Y92410 Unspecified street and highway as the place of occurrence of the external cause: Secondary | ICD-10-CM | POA: Insufficient documentation

## 2012-01-07 DIAGNOSIS — F17200 Nicotine dependence, unspecified, uncomplicated: Secondary | ICD-10-CM | POA: Insufficient documentation

## 2012-01-07 DIAGNOSIS — I10 Essential (primary) hypertension: Secondary | ICD-10-CM | POA: Insufficient documentation

## 2012-01-07 DIAGNOSIS — S139XXA Sprain of joints and ligaments of unspecified parts of neck, initial encounter: Secondary | ICD-10-CM | POA: Insufficient documentation

## 2012-01-07 DIAGNOSIS — E119 Type 2 diabetes mellitus without complications: Secondary | ICD-10-CM | POA: Insufficient documentation

## 2012-01-07 MED ORDER — METHYLPREDNISOLONE 4 MG PO KIT: PACK | ORAL | Status: AC

## 2012-01-07 MED ORDER — HYDROCODONE-ACETAMINOPHEN 5-325 MG PO TABS
1.0000 | ORAL_TABLET | Freq: Once | ORAL | Status: AC
  Administered 2012-01-07: 1 via ORAL
  Filled 2012-01-07: qty 1
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
## 2012-01-07 NOTE — Progress Notes (Signed)
Orthopedic Tech Progress Note Patient Details:  [name] June 25, 1966 [id number]  Ortho Devices Type of Ortho Device: [place] cervical collar (soft cervical collar) Ortho Device/Splint Location: neck Ortho Device/Splint Interventions: Application   [name] 01/07/2012, 5:21 PM

## 2012-01-07 NOTE — ED Notes (Signed)
Patient transported to X-ray 
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
## 2012-10-28 ENCOUNTER — Emergency Department (HOSPITAL_COMMUNITY)
Admission: EM | Admit: 2012-10-28 | Discharge: 2012-10-28 | Disposition: A | Discharge status: Home / Self Care | Payer: Medicare Other | Attending: Emergency Medicine | Admitting: Emergency Medicine

## 2012-10-28 ENCOUNTER — Emergency Department (HOSPITAL_COMMUNITY): Payer: Medicare Other

## 2012-10-28 ENCOUNTER — Encounter (HOSPITAL_COMMUNITY): Payer: Self-pay | Admitting: *Deleted

## 2012-10-28 DIAGNOSIS — G40909 Epilepsy, unspecified, not intractable, without status epilepticus: Secondary | ICD-10-CM | POA: Insufficient documentation

## 2012-10-28 DIAGNOSIS — Z8701 Personal history of pneumonia (recurrent): Secondary | ICD-10-CM | POA: Insufficient documentation

## 2012-10-28 DIAGNOSIS — W64XXXA Exposure to other animate mechanical forces, initial encounter: Secondary | ICD-10-CM | POA: Insufficient documentation

## 2012-10-28 DIAGNOSIS — S6990XA Unspecified injury of unspecified wrist, hand and finger(s), initial encounter: Secondary | ICD-10-CM | POA: Insufficient documentation

## 2012-10-28 DIAGNOSIS — M79641 Pain in right hand: Secondary | ICD-10-CM

## 2012-10-28 DIAGNOSIS — F17200 Nicotine dependence, unspecified, uncomplicated: Secondary | ICD-10-CM | POA: Insufficient documentation

## 2012-10-28 DIAGNOSIS — W1809XA Striking against other object with subsequent fall, initial encounter: Secondary | ICD-10-CM | POA: Insufficient documentation

## 2012-10-28 DIAGNOSIS — S6991XA Unspecified injury of right wrist, hand and finger(s), initial encounter: Secondary | ICD-10-CM

## 2012-10-28 DIAGNOSIS — I10 Essential (primary) hypertension: Secondary | ICD-10-CM | POA: Insufficient documentation

## 2012-10-28 DIAGNOSIS — Z872 Personal history of diseases of the skin and subcutaneous tissue: Secondary | ICD-10-CM | POA: Insufficient documentation

## 2012-10-28 DIAGNOSIS — Y9389 Activity, other specified: Secondary | ICD-10-CM | POA: Insufficient documentation

## 2012-10-28 DIAGNOSIS — Y929 Unspecified place or not applicable: Secondary | ICD-10-CM | POA: Insufficient documentation

## 2012-10-28 DIAGNOSIS — J449 Chronic obstructive pulmonary disease, unspecified: Secondary | ICD-10-CM | POA: Insufficient documentation

## 2012-10-28 DIAGNOSIS — J4489 Other specified chronic obstructive pulmonary disease: Secondary | ICD-10-CM | POA: Insufficient documentation

## 2012-10-28 DIAGNOSIS — E119 Type 2 diabetes mellitus without complications: Secondary | ICD-10-CM | POA: Insufficient documentation

## 2012-10-28 DIAGNOSIS — Z79899 Other long term (current) drug therapy: Secondary | ICD-10-CM | POA: Insufficient documentation

## 2012-10-28 NOTE — ED Notes (Signed)
Pt states tripped with dog and landed on right hand.  Wiggles digits

## 2012-10-28 NOTE — ED Provider Notes (Signed)
History     CSN: [id number]  Arrival date & time 10/28/12  1229   First MD Initiated Contact with Patient 10/28/12 1440      Chief Complaint  Patient presents with  . Hand Pain    (Consider location/radiation/quality/duration/timing/severity/associated sxs/prior treatment) HPI Comments: [name]. Is a 47 y/o M with PMHx of HTN, DM, COPD presenting to the ED with right hand pain x 5 days. Patient reported that as he was walking up the steps Thursday, his dog got underneath his feet, patient reported losing his footing and ended up falling down landing on his right hand outstretched in a flexed manner. Described discomfort as a subtle throbbing sensation that is constant with a burning sensation that started yesterday with radiation of pain to right wrist. Patient reported that he has been icing the hand and using vicodin that he had left over from previous neck surgery. Patient reported that he has reconstructive surgery on the right hand - back in 1999 when a forklift fell directly onto his hand, denied any complications after the surgery. Denied numbness, tingling, loss of sensation, weakness.     The history is provided by the patient. No language interpreter was used.    Past Medical History  Diagnosis Date  . Hypertension   . Diabetes mellitus   . Pneumonia   . COPD (chronic obstructive pulmonary disease)     chronic Bronchhititis  . Complication of anesthesia   . Seizures     last one 3 years ago.  Md discontinued meds- Dr Anne Hahn  . 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    Past Surgical History  Procedure Laterality Date  . Tonsillectomy    . Hip surgery      rods to each hip- hip can ot of socket.  . Wrist surgery      repair due to crush  . Back surgery      5 from degenerative disease..    . Anterior cervical decomp/discectomy fusion  12/20/2011    Procedure: ANTERIOR CERVICAL DECOMPRESSION/DISCECTOMY FUSION 2 LEVELS;  Surgeon: Mariam Dollar,  MD;  Location: [facility];  Service: Neurosurgery;  Laterality: N/A;  Cervical four-five, five-six anterior cervical decompression/discectomy fusion with plating    No family history on file.  History  Substance Use Topics  . Smoking status: Current Every Day Smoker -- 0.50 packs/day  . Smokeless tobacco: Not on file  . Alcohol Use: No     Comment: occassional       Review of Systems  Constitutional: Negative for fever and chills.  HENT: Negative for ear pain, sore throat, trouble swallowing, neck pain, neck stiffness and tinnitus.   Eyes: Negative for pain and visual disturbance.  Respiratory: Negative for cough, chest tightness and shortness of breath.   Cardiovascular: Negative for chest pain.  Gastrointestinal: Negative for nausea, vomiting, abdominal pain, diarrhea and constipation.  Genitourinary: Negative for dysuria, hematuria, decreased urine volume and difficulty urinating.  Musculoskeletal: Positive for arthralgias. Negative for myalgias, back pain and joint swelling.       Right hand pain  Skin: Negative for rash and wound.  Neurological: Negative for dizziness, weakness, light-headedness, numbness and headaches.  All other systems reviewed and are negative.    Allergies  Darvocet and Onion  Home Medications   Current Outpatient Rx  Name  Route  Sig  Dispense  Refill  . acetaminophen (TYLENOL) 500 MG tablet   Oral   Take 1,000  mg by mouth every 6 (six) hours as needed for pain. For pain         . amLODipine (NORVASC) 10 MG tablet   Oral   Take 10 mg by mouth daily.         Marland Kitchen atenolol (TENORMIN) 25 MG tablet   Oral   Take 25 mg by mouth daily.         Marland Kitchen glimepiride (AMARYL) 2 MG tablet   Oral   Take 2 mg by mouth daily before breakfast.         . HYDROcodone-acetaminophen (NORCO) 10-325 MG per tablet   Oral   Take 1-2 tablets by mouth every 4 (four) hours as needed for pain. For pain         . metFORMIN (GLUCOPHAGE) 850 MG tablet    Oral   Take 850 mg by mouth 2 (two) times daily with a meal.         . albuterol (PROVENTIL HFA;VENTOLIN HFA) 108 (90 BASE) MCG/ACT inhaler   Inhalation   Inhale 2 puffs into the lungs every 6 (six) hours as needed for wheezing. For shortness of breath           BP 119/69  Pulse 51  Temp(Src) 99.2 F (37.3 C) (Oral)  Resp 18  SpO2 100%  Physical Exam  Nursing note and vitals reviewed. Constitutional: He is oriented to person, place, and time. He appears well-developed and well-nourished. No distress.  HENT:  Head: Normocephalic and atraumatic.  Mouth/Throat: Oropharynx is clear and moist. No oropharyngeal exudate.  Uvula midline, symmetrical elevation  Eyes: Conjunctivae and EOM are normal. Pupils are equal, round, and reactive to light. Right eye exhibits no discharge. Left eye exhibits no discharge.  Neck: Normal range of motion. Neck supple. No tracheal deviation present.  Cardiovascular: Normal rate, regular rhythm and normal heart sounds.  Exam reveals no friction rub.   No murmur heard. Radial pulses 2+ bilaterally  Pulmonary/Chest: Effort normal and breath sounds normal. No respiratory distress. He has no wheezes. He has no rales. He exhibits no tenderness.  Musculoskeletal: Normal range of motion. He exhibits tenderness. He exhibits no edema.       Right wrist: He exhibits normal range of motion, no tenderness, no bony tenderness, no swelling, no effusion, no crepitus, no deformity and no laceration.       Arms: Negative ecchymosis, swelling, inflammation, erythema, warmth to touch of right hand. Tenderness upon palpation to right index and pink finger, pain upon palpation to the dorsal aspect of right hand Full ROM to right fingers, hand, wrist, elbow, shoulder Strength 5+/5+ to right upper extremity  Lymphadenopathy:    He has no cervical adenopathy.  Neurological: He is alert and oriented to person, place, and time. No cranial nerve deficit. He exhibits normal  muscle tone. Coordination normal.  Sensation intact to right upper extremity with differentiation between sharp and dull sensation.   Skin: Skin is warm and dry. No rash noted. He is not diaphoretic. No erythema.  Psychiatric: He has a normal mood and affect. His behavior is normal. Thought content normal.    ED Course  Procedures (including critical care time)  Labs Reviewed - No data to display Dg Hand Complete Right  10/28/2012  *RADIOLOGY REPORT*  Clinical Data: Fall with fourth and fifth metacarpal pain.  RIGHT HAND - COMPLETE 3+ VIEW  Comparison: None.  Findings: No acute osseous or joint abnormality.  Postoperative and degenerative changes are seen in the carpus.  IMPRESSION: No acute findings.   Original Report Authenticated By: Leanna Battles, M.D.      1. Right hand pain   2. Hand injury, right, initial encounter   3. HTN (hypertension)   4. Diabetes mellitus   5. COPD (chronic obstructive pulmonary disease)       MDM  I personally evaluated and examined the patient. Patient afebrile, normotensive, non-tachycardic, alert and oriented. No erythema, swelling, inflammation, warmth to touch, ecchymosis noted to right hand. Full ROM and strength 5+/5+ to right upper extremity. No neurovascular damaged noted. Right hand xray - no acute fractures or dislocations noted. Patient aseptic, non-toxic appearing, no sign of septic joint, in no acute distress. Discharged patient. Recommended patient to follow-up with orthopedics by the end of this week to be re-evaluated - patient reported that he favored following with Dr. Metro Kung, physician who performed his surgery, reported to be fine, gave Dr. Shelba Flake information if he cannot be seen by physician by the end of this week. Patient placed in right hand brace - instructed to wear brace at all times until cleared by orthopedics. Discussed to keep hand elevated at heart level, rest, no strenuous activity, ice as often as possible. Did not  discharge patient with pain medications since he has them at home - discussed restrictions and precautions while taking pain medications. Discussed with patient to follow-up with Dr. Roseanne Reno, PCP, regarding injury and visit to the ED. Discussed smoking cessation with patient since has history of COPD - recommended to discussed with PCP about the options. Discussed with patient to continue home medications as prescribed. Discussed to monitor symptoms and if symptoms worsen or change to report back to the ED. Patient agreed to plan of care, understood, all questions answered.         Raymon Mutton, PA-C 10/28/12 2232 Denied head trauma, injury, headache, dizziness.  Raymon Mutton, PA-C 10/28/12 2244

## 2012-10-28 NOTE — Progress Notes (Signed)
Orthopedic Tech Progress Note Patient Details:  [name] Apr 24, 1966 [id number]  Ortho Devices Type of Ortho Device: Velcro wrist splint Ortho Device/Splint Location: RUE Ortho Device/Splint Interventions: Ordered;Application   Jennye Moccasin 10/28/2012, 4:27 PM

## 2012-10-28 NOTE — ED Notes (Signed)
Ortho paged and responded. 

## 2012-10-29 NOTE — ED Provider Notes (Signed)
Medical screening examination/treatment/procedure(s) were performed by non-physician practitioner and as supervising physician I was immediately available for consultation/collaboration.   Kiyon Fidalgo, MD 10/29/12 1743 
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
## 2013-02-07 ENCOUNTER — Emergency Department (HOSPITAL_COMMUNITY): Payer: Medicare Other

## 2013-02-07 ENCOUNTER — Encounter (HOSPITAL_COMMUNITY): Payer: Self-pay

## 2013-02-07 ENCOUNTER — Emergency Department (HOSPITAL_COMMUNITY)
Admission: EM | Admit: 2013-02-07 | Discharge: 2013-02-07 | Disposition: A | Discharge status: Home / Self Care | Payer: Medicare Other | Attending: Emergency Medicine | Admitting: Emergency Medicine

## 2013-02-07 DIAGNOSIS — S46909A Unspecified injury of unspecified muscle, fascia and tendon at shoulder and upper arm level, unspecified arm, initial encounter: Secondary | ICD-10-CM | POA: Insufficient documentation

## 2013-02-07 DIAGNOSIS — S0993XA Unspecified injury of face, initial encounter: Secondary | ICD-10-CM | POA: Insufficient documentation

## 2013-02-07 DIAGNOSIS — IMO0002 Reserved for concepts with insufficient information to code with codable children: Secondary | ICD-10-CM | POA: Insufficient documentation

## 2013-02-07 DIAGNOSIS — Z8739 Personal history of other diseases of the musculoskeletal system and connective tissue: Secondary | ICD-10-CM | POA: Insufficient documentation

## 2013-02-07 DIAGNOSIS — S59909A Unspecified injury of unspecified elbow, initial encounter: Secondary | ICD-10-CM | POA: Insufficient documentation

## 2013-02-07 DIAGNOSIS — J4489 Other specified chronic obstructive pulmonary disease: Secondary | ICD-10-CM | POA: Insufficient documentation

## 2013-02-07 DIAGNOSIS — J449 Chronic obstructive pulmonary disease, unspecified: Secondary | ICD-10-CM | POA: Insufficient documentation

## 2013-02-07 DIAGNOSIS — Z8701 Personal history of pneumonia (recurrent): Secondary | ICD-10-CM | POA: Insufficient documentation

## 2013-02-07 DIAGNOSIS — I10 Essential (primary) hypertension: Secondary | ICD-10-CM | POA: Insufficient documentation

## 2013-02-07 DIAGNOSIS — Y9389 Activity, other specified: Secondary | ICD-10-CM | POA: Insufficient documentation

## 2013-02-07 DIAGNOSIS — H1130 Conjunctival hemorrhage, unspecified eye: Secondary | ICD-10-CM | POA: Insufficient documentation

## 2013-02-07 DIAGNOSIS — S8990XA Unspecified injury of unspecified lower leg, initial encounter: Secondary | ICD-10-CM | POA: Insufficient documentation

## 2013-02-07 DIAGNOSIS — F17200 Nicotine dependence, unspecified, uncomplicated: Secondary | ICD-10-CM | POA: Insufficient documentation

## 2013-02-07 DIAGNOSIS — E119 Type 2 diabetes mellitus without complications: Secondary | ICD-10-CM | POA: Insufficient documentation

## 2013-02-07 DIAGNOSIS — S4980XA Other specified injuries of shoulder and upper arm, unspecified arm, initial encounter: Secondary | ICD-10-CM | POA: Insufficient documentation

## 2013-02-07 DIAGNOSIS — Y92410 Unspecified street and highway as the place of occurrence of the external cause: Secondary | ICD-10-CM | POA: Insufficient documentation

## 2013-02-07 DIAGNOSIS — S6990XA Unspecified injury of unspecified wrist, hand and finger(s), initial encounter: Secondary | ICD-10-CM | POA: Insufficient documentation

## 2013-02-07 DIAGNOSIS — Z79899 Other long term (current) drug therapy: Secondary | ICD-10-CM | POA: Insufficient documentation

## 2013-02-07 MED ORDER — HYDROCODONE-ACETAMINOPHEN 5-325 MG PO TABS: 1.0000 | ORAL_TABLET | Freq: Four times a day (QID) | ORAL | Status: DC | PRN
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
## 2013-02-07 NOTE — Progress Notes (Signed)
Orthopedic Tech Progress Note Patient Details:  [name]. 06-Jul-1965 [id number] CAM walker applied. Tolerated well. Ortho Devices Type of Ortho Device: CAM walker Ortho Device/Splint Interventions: Application   Asia R Thompson 02/07/2013, 3:56 PM

## 2013-02-07 NOTE — ED Notes (Signed)
Placed some bactrin on patient wounds and wrap in 4 by 4's
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
## 2013-03-27 ENCOUNTER — Ambulatory Visit: Payer: Medicare Other | Attending: Orthopedic Surgery | Admitting: Physical Therapy

## 2013-04-07 ENCOUNTER — Encounter (HOSPITAL_COMMUNITY): Payer: Self-pay | Admitting: Emergency Medicine

## 2013-04-07 ENCOUNTER — Emergency Department (INDEPENDENT_AMBULATORY_CARE_PROVIDER_SITE_OTHER)
Admission: EM | Admit: 2013-04-07 | Discharge: 2013-04-07 | Disposition: A | Discharge status: Home / Self Care | Payer: Self-pay | Source: Home / Self Care

## 2013-04-07 DIAGNOSIS — M542 Cervicalgia: Secondary | ICD-10-CM

## 2013-04-07 DIAGNOSIS — S139XXA Sprain of joints and ligaments of unspecified parts of neck, initial encounter: Secondary | ICD-10-CM

## 2013-04-07 DIAGNOSIS — S161XXA Strain of muscle, fascia and tendon at neck level, initial encounter: Secondary | ICD-10-CM
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
## 2013-06-20 ENCOUNTER — Encounter (HOSPITAL_COMMUNITY): Payer: Self-pay | Admitting: Emergency Medicine

## 2013-06-20 ENCOUNTER — Emergency Department (INDEPENDENT_AMBULATORY_CARE_PROVIDER_SITE_OTHER): Payer: Medicare Other

## 2013-06-20 ENCOUNTER — Emergency Department (INDEPENDENT_AMBULATORY_CARE_PROVIDER_SITE_OTHER)
Admission: EM | Admit: 2013-06-20 | Discharge: 2013-06-20 | Disposition: A | Discharge status: Home / Self Care | Payer: Medicare Other | Source: Home / Self Care | Attending: Family Medicine | Admitting: Family Medicine

## 2013-06-20 DIAGNOSIS — J189 Pneumonia, unspecified organism: Secondary | ICD-10-CM

## 2013-06-20 MED ORDER — IPRATROPIUM BROMIDE 0.06 % NA SOLN: 2.0000 | Freq: Four times a day (QID) | NASAL | Status: DC

## 2013-06-20 MED ORDER — MOXIFLOXACIN HCL 400 MG PO TABS: 400.0000 mg | ORAL_TABLET | Freq: Every day | ORAL | Status: DC

## 2013-06-20 NOTE — ED Notes (Signed)
Pt  Has  Symptoms  Of  Cough  Body  Aches  And        Fever        With  The  Symptoms    X  3  Days  -  Pt  Is  Sitting  Upright on  Exam table  Speaking  In  Complete  sentances
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
## 2013-08-08 ENCOUNTER — Encounter (HOSPITAL_COMMUNITY): Payer: Self-pay | Admitting: Emergency Medicine

## 2013-08-08 ENCOUNTER — Emergency Department (INDEPENDENT_AMBULATORY_CARE_PROVIDER_SITE_OTHER): Payer: Medicare Other

## 2013-08-08 ENCOUNTER — Emergency Department (INDEPENDENT_AMBULATORY_CARE_PROVIDER_SITE_OTHER)
Admission: EM | Admit: 2013-08-08 | Discharge: 2013-08-08 | Disposition: A | Discharge status: Home / Self Care | Payer: Medicare Other | Source: Home / Self Care | Attending: Family Medicine | Admitting: Family Medicine

## 2013-08-08 DIAGNOSIS — J410 Simple chronic bronchitis: Secondary | ICD-10-CM

## 2013-08-08 DIAGNOSIS — Z720 Tobacco use: Secondary | ICD-10-CM

## 2013-08-08 DIAGNOSIS — J40 Bronchitis, not specified as acute or chronic: Principal | ICD-10-CM

## 2013-08-08 MED ORDER — IPRATROPIUM BROMIDE 0.02 % IN SOLN
0.5000 mg | Freq: Once | RESPIRATORY_TRACT | Status: AC
  Administered 2013-08-08: 0.5 mg via RESPIRATORY_TRACT

## 2013-08-08 MED ORDER — ALBUTEROL SULFATE (2.5 MG/3ML) 0.083% IN NEBU
INHALATION_SOLUTION | RESPIRATORY_TRACT | Status: AC
  Filled 2013-08-08: qty 6

## 2013-08-08 MED ORDER — METHYLPREDNISOLONE SODIUM SUCC 125 MG IJ SOLR
INTRAMUSCULAR | Status: AC
  Filled 2013-08-08: qty 2

## 2013-08-08 MED ORDER — MOXIFLOXACIN HCL 400 MG PO TABS: 400.0000 mg | ORAL_TABLET | Freq: Every day | ORAL | Status: DC

## 2013-08-08 MED ORDER — METHYLPREDNISOLONE SODIUM SUCC 125 MG IJ SOLR
125.0000 mg | Freq: Once | INTRAMUSCULAR | Status: AC
  Administered 2013-08-08: 125 mg via INTRAMUSCULAR

## 2013-08-08 MED ORDER — IPRATROPIUM BROMIDE 0.02 % IN SOLN
RESPIRATORY_TRACT | Status: AC
  Filled 2013-08-08: qty 2.5

## 2013-08-08 MED ORDER — ALBUTEROL SULFATE (2.5 MG/3ML) 0.083% IN NEBU
5.0000 mg | INHALATION_SOLUTION | Freq: Once | RESPIRATORY_TRACT | Status: AC
  Administered 2013-08-08: 5 mg via RESPIRATORY_TRACT

## 2013-08-08 MED ORDER — SODIUM CHLORIDE 0.9 % IJ SOLN
INTRAMUSCULAR | Status: AC
  Filled 2013-08-08: qty 3

## 2013-08-08 NOTE — ED Notes (Signed)
Follow up from 07/21/13  States he still has cold sx Was seen by PCP on 07/23/13 States he still has a cough, congestion, nausea, vomiting, headaches, and feeling weak States PCP did give him antibiotics which he has finished.

## 2013-08-08 NOTE — ED Provider Notes (Signed)
CSN: [id number]     Arrival date & time 08/08/13  0906 History   First MD Initiated Contact with Patient 08/08/13 0957     Chief Complaint  Patient presents with  . URI   (Consider location/radiation/quality/duration/timing/severity/associated sxs/prior Treatment) Patient is a 48 y.o. male presenting with URI. The history is provided by the patient.  URI Presenting symptoms: congestion and cough   Severity:  Moderate Onset quality:  Gradual Duration:  2 weeks (seen 1/21 at [facility] , given doxy x 10 d, still smoking , cough and cong persist. ) Progression:  Unchanged Associated symptoms: wheezing   Risk factors: chronic respiratory disease     Past Medical History  Diagnosis Date  . Hypertension   . Diabetes mellitus   . Pneumonia   . COPD (chronic obstructive pulmonary disease)     chronic Bronchhititis  . Complication of anesthesia   . Seizures     last one 3 years ago.  Md discontinued meds- Dr Anne Hahn  . 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   Past Surgical History  Procedure Laterality Date  . Tonsillectomy    . Hip surgery      rods to each hip- hip can ot of socket.  . Wrist surgery      repair due to crush  . Back surgery      5 from degenerative disease..    . Anterior cervical decomp/discectomy fusion  12/20/2011    Procedure: ANTERIOR CERVICAL DECOMPRESSION/DISCECTOMY FUSION 2 LEVELS;  Surgeon: Mariam Dollar, MD;  Location: [facility];  Service: Neurosurgery;  Laterality: N/A;  Cervical four-five, five-six anterior cervical decompression/discectomy fusion with plating   History reviewed. No pertinent family history. History  Substance Use Topics  . Smoking status: Current Every Day Smoker -- 0.50 packs/day  . Smokeless tobacco: Not on file  . Alcohol Use: No     Comment: occassional     Review of Systems  HENT: Positive for congestion and postnasal drip.   Respiratory: Positive for cough and wheezing.   Cardiovascular: Negative.    Gastrointestinal: Negative.   Neurological: Positive for weakness.    Allergies  Darvocet and Onion  Home Medications   Current Outpatient Rx  Name  Route  Sig  Dispense  Refill  . acetaminophen (TYLENOL) 500 MG tablet   Oral   Take 1,000 mg by mouth every 6 (six) hours as needed for pain. For pain         . acidophilus (RISAQUAD) CAPS capsule   Oral   Take by mouth daily.         Marland Kitchen albuterol (PROVENTIL HFA;VENTOLIN HFA) 108 (90 BASE) MCG/ACT inhaler   Inhalation   Inhale 2 puffs into the lungs every 6 (six) hours as needed for wheezing. For shortness of breath         . amLODipine (NORVASC) 10 MG tablet   Oral   Take 10 mg by mouth daily.         Marland Kitchen atenolol (TENORMIN) 25 MG tablet   Oral   Take 25 mg by mouth daily.         . Charcoal Activated (CHARCOAL PO)   Oral   Take 1 tablet by mouth daily.         Marland Kitchen glimepiride (AMARYL) 2 MG tablet   Oral   Take 2 mg by mouth daily before breakfast.         . HYDROcodone-acetaminophen (NORCO) 10-325 MG  per tablet   Oral   Take 1-2 tablets by mouth every 4 (four) hours as needed for pain. For pain         . HYDROcodone-acetaminophen (NORCO/VICODIN) 5-325 MG per tablet   Oral   Take 1 tablet by mouth every 6 (six) hours as needed for pain.   30 tablet   0   . ipratropium (ATROVENT) 0.06 % nasal spray   Nasal   Place 2 sprays into the nose 4 (four) times daily.   15 mL   1   . metFORMIN (GLUCOPHAGE) 850 MG tablet   Oral   Take 850 mg by mouth 2 (two) times daily with a meal.         . moxifloxacin (AVELOX) 400 MG tablet   Oral   Take 1 tablet (400 mg total) by mouth daily.   7 tablet   0   . moxifloxacin (AVELOX) 400 MG tablet   Oral   Take 1 tablet (400 mg total) by mouth daily.   7 tablet   0    BP 130/85  Pulse 59  Temp(Src) 98.4 F (36.9 C) (Oral)  Resp 16  SpO2 98% Physical Exam  Nursing note and vitals reviewed. Constitutional: He is oriented to person, place, and time. He  appears well-developed and well-nourished.  HENT:  Head: Normocephalic.  Right Ear: External ear normal.  Left Ear: External ear normal.  Mouth/Throat: Oropharynx is clear and moist.  Eyes: Conjunctivae are normal. Pupils are equal, round, and reactive to light.  Neck: Normal range of motion. Neck supple.  Cardiovascular: Normal rate, normal heart sounds and intact distal pulses.   Pulmonary/Chest: He has wheezes. He has rhonchi.  Abdominal: Soft. Bowel sounds are normal.  Neurological: He is alert and oriented to person, place, and time.  Skin: Skin is warm and dry.    ED Course  Procedures (including critical care time) Labs Review Labs Reviewed - No data to display Imaging Review Dg Chest 2 View  08/08/2013   CLINICAL DATA:  Cough  EXAM: CHEST  2 VIEW  COMPARISON:  June 20, 2013  FINDINGS: Lungs are clear. Heart size and pulmonary vascularity are normal. No adenopathy. There is postoperative change in the lumbar spine.  IMPRESSION: No edema or consolidation.   Electronically Signed   By: Bretta BangWilliam  Woodruff M.D.   On: 08/08/2013 10:45      MDM  X-rays reviewed and report per radiologist. Sx improved with neb.     Linna HoffJames D Kindl, MD 08/08/13 [phone]

## 2013-08-08 NOTE — Discharge Instructions (Signed)
Take all of medicine, drink lots of fluids, no more smoking, see your doctor if further problems  °

## 2014-01-12 ENCOUNTER — Emergency Department (INDEPENDENT_AMBULATORY_CARE_PROVIDER_SITE_OTHER)
Admission: EM | Admit: 2014-01-12 | Discharge: 2014-01-12 | Disposition: A | Discharge status: Home / Self Care | Payer: Medicare Other | Source: Home / Self Care

## 2014-01-12 ENCOUNTER — Encounter (HOSPITAL_COMMUNITY): Payer: Self-pay | Admitting: Emergency Medicine

## 2014-01-12 ENCOUNTER — Emergency Department (INDEPENDENT_AMBULATORY_CARE_PROVIDER_SITE_OTHER): Payer: Medicare Other

## 2014-01-12 DIAGNOSIS — J441 Chronic obstructive pulmonary disease with (acute) exacerbation: Secondary | ICD-10-CM

## 2014-01-12 DIAGNOSIS — J9801 Acute bronchospasm: Secondary | ICD-10-CM

## 2014-01-12 MED ORDER — IPRATROPIUM-ALBUTEROL 0.5-2.5 (3) MG/3ML IN SOLN
RESPIRATORY_TRACT | Status: AC
  Filled 2014-01-12: qty 3

## 2014-01-12 MED ORDER — PREDNISONE 20 MG PO TABS: ORAL_TABLET | ORAL | Status: DC

## 2014-01-12 MED ORDER — PREDNISONE 20 MG PO TABS
ORAL_TABLET | ORAL | Status: AC
  Filled 2014-01-12: qty 3

## 2014-01-12 MED ORDER — PREDNISONE 20 MG PO TABS
60.0000 mg | ORAL_TABLET | Freq: Once | ORAL | Status: AC
  Administered 2014-01-12: 60 mg via ORAL

## 2014-01-12 MED ORDER — ALBUTEROL SULFATE (2.5 MG/3ML) 0.083% IN NEBU
2.5000 mg | INHALATION_SOLUTION | Freq: Once | RESPIRATORY_TRACT | Status: AC
  Administered 2014-01-12: 2.5 mg via RESPIRATORY_TRACT

## 2014-01-12 MED ORDER — IPRATROPIUM-ALBUTEROL 0.5-2.5 (3) MG/3ML IN SOLN
3.0000 mL | Freq: Once | RESPIRATORY_TRACT | Status: AC
  Administered 2014-01-12: 3 mL via RESPIRATORY_TRACT

## 2014-01-12 MED ORDER — ALBUTEROL SULFATE (2.5 MG/3ML) 0.083% IN NEBU
INHALATION_SOLUTION | RESPIRATORY_TRACT | Status: AC
  Filled 2014-01-12: qty 3

## 2014-01-12 NOTE — ED Notes (Signed)
C/o  Shortness of breath with chest tightness x 5 days.   Denies fever, n/v/d.  Hx of asthma and COPD.   States "symptoms gradually getting worse and I can't sleep at night"

## 2014-01-12 NOTE — ED Provider Notes (Signed)
CSN: [id number]     Arrival date & time 01/12/14  1021 History   First MD Initiated Contact with Patient 01/12/14 1045     Chief Complaint  Patient presents with  . Shortness of Breath   (Consider location/radiation/quality/duration/timing/severity/associated sxs/prior Treatment) HPI Comments: 48 y o m with current hx of COPD, Pneumonia,T2DM, and HTN presents with dyspnea, cough spasms, anterior chest pain primarily associated with cough for past 5 days. Getting worse. Using alb HFA tid without relief.  Smoking 2PPD since age of 48. "Stopped smoking 1 month ago".   Past Medical History  Diagnosis Date  . Hypertension   . Diabetes mellitus   . Pneumonia   . COPD (chronic obstructive pulmonary disease)     chronic Bronchhititis  . Complication of anesthesia   . Seizures     last one 3 years ago.  Md discontinued meds- Dr Anne HahnWillis  . 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   Past Surgical History  Procedure Laterality Date  . Tonsillectomy    . Hip surgery      rods to each hip- hip can ot of socket.  . Wrist surgery      repair due to crush  . Back surgery      5 from degenerative disease..    . Anterior cervical decomp/discectomy fusion  12/20/2011    Procedure: ANTERIOR CERVICAL DECOMPRESSION/DISCECTOMY FUSION 2 LEVELS;  Surgeon: Mariam DollarGary P Cram, MD;  Location: [facility];  Service: Neurosurgery;  Laterality: N/A;  Cervical four-five, five-six anterior cervical decompression/discectomy fusion with plating   History reviewed. No pertinent family history. History  Substance Use Topics  . Smoking status: Current Every Day Smoker -- 0.50 packs/day  . Smokeless tobacco: Not on file  . Alcohol Use: No     Comment: occassional     Review of Systems  Constitutional: Positive for activity change. Negative for fever.  HENT: Positive for congestion and sore throat.   Eyes: Negative for visual disturbance.  Respiratory: Positive for cough, chest tightness, shortness of  breath and wheezing.   Cardiovascular: Positive for chest pain. Negative for palpitations.  Gastrointestinal: Negative.   Genitourinary: Negative.     Allergies  Darvocet and Onion  Home Medications   Prior to Admission medications   Medication Sig Start Date End Date Taking? Authorizing Provider  acetaminophen (TYLENOL) 500 MG tablet Take 1,000 mg by mouth every 6 (six) hours as needed for pain. For pain    Historical Provider, MD  acidophilus (RISAQUAD) CAPS capsule Take by mouth daily.    Historical Provider, MD  albuterol (PROVENTIL HFA;VENTOLIN HFA) 108 (90 BASE) MCG/ACT inhaler Inhale 2 puffs into the lungs every 6 (six) hours as needed for wheezing. For shortness of breath    Historical Provider, MD  amLODipine (NORVASC) 10 MG tablet Take 10 mg by mouth daily.    Historical Provider, MD  atenolol (TENORMIN) 25 MG tablet Take 25 mg by mouth daily.    Historical Provider, MD  Charcoal Activated (CHARCOAL PO) Take 1 tablet by mouth daily.    Historical Provider, MD  glimepiride (AMARYL) 2 MG tablet Take 2 mg by mouth daily before breakfast.    Historical Provider, MD  HYDROcodone-acetaminophen (NORCO) 10-325 MG per tablet Take 1-2 tablets by mouth every 4 (four) hours as needed for pain. For pain    Historical Provider, MD  HYDROcodone-acetaminophen (NORCO/VICODIN) 5-325 MG per tablet Take 1 tablet by mouth every 6 (six) hours as needed for pain.  02/07/13   Tommie Sams, DO  ipratropium (ATROVENT) 0.06 % nasal spray Place 2 sprays into the nose 4 (four) times daily. 06/20/13   Linna Hoff, MD  metFORMIN (GLUCOPHAGE) 850 MG tablet Take 850 mg by mouth 2 (two) times daily with a meal.    Historical Provider, MD  moxifloxacin (AVELOX) 400 MG tablet Take 1 tablet (400 mg total) by mouth daily. 06/20/13   Linna Hoff, MD  moxifloxacin (AVELOX) 400 MG tablet Take 1 tablet (400 mg total) by mouth daily. 08/08/13   Linna Hoff, MD  predniSONE (DELTASONE) 20 MG tablet Take 3 tabs po on first  day, 2 tabs second day, 2 tabs third day, 1 tab fourth day, 1 tab 5th day. Take with food.Start 01/12/14. 01/12/14   Hayden Rasmussen, NP   BP 166/103  Pulse 67  Temp(Src) 98.3 F (36.8 C) (Oral)  Resp 20  SpO2 97% Physical Exam  Nursing note and vitals reviewed. Constitutional: He is oriented to person, place, and time. He appears well-developed and well-nourished. No distress.  HENT:  Right Ear: External ear normal.  Left Ear: External ear normal.  Mouth/Throat: No oropharyngeal exudate.  OP with mild erythema. Tongue coated with brown tobacco product and other discolorations.  Eyes: Conjunctivae and EOM are normal.  Neck: Normal range of motion. Neck supple.  Cardiovascular: Normal rate, regular rhythm and normal heart sounds.   Pulmonary/Chest: No respiratory distress. He has wheezes. He exhibits tenderness.  Frequent cough, difficult to hear expirations due to associated cough with each one.   Lymphadenopathy:    He has no cervical adenopathy.  Neurological: He is alert and oriented to person, place, and time.  Skin: Skin is warm and dry.  Psychiatric: He has a normal mood and affect.    ED Course  Procedures (including critical care time) Labs Review Labs Reviewed - No data to display  Imaging Review Dg Chest 2 View  01/12/2014   CLINICAL DATA:  Shortness of breath. Chest tightness for 5 days. History of asthma, COPD and lupus. Diabetic hypertensive patient  EXAM: CHEST  2 VIEW  COMPARISON:  08/08/2013 and 02/07/2013  FINDINGS: Chronic peribronchial thickening and increased markings perihilar region without evidence of segmental infiltrate, congestive heart failure or pneumothorax.  Heart size within normal limits.  Postsurgical changes upper lumbar spine.  IMPRESSION: Chronic lung changes without acute abnormality.  Please see above.   Electronically Signed   By: Bridgett Larsson M.D.   On: 01/12/2014 12:08     MDM   1. COPD exacerbation   2. Bronchospasm     Completed Duoneb  1110h: Rare cough now, st breathing much better. Lungs with good air movement throughout, rare wheeze. Prednisone 60 mg PO  Prednisone taper Cont the Alb HFA prn Follow with your PCP    Hayden Rasmussen, NP 01/12/14 1242

## 2014-01-12 NOTE — Discharge Instructions (Signed)
Chronic Obstructive Pulmonary Disease Exacerbation Chronic obstructive pulmonary disease (COPD) is a common lung condition in which airflow from the lungs is limited. COPD is a general term that can be used to describe many different lung problems that limit airflow, including chronic bronchitis and emphysema. COPD exacerbations are episodes when breathing symptoms become much worse and require extra treatment. Without treatment, COPD exacerbations can be life threatening, and frequent COPD exacerbations can cause further damage to your lungs. CAUSES   Respiratory infections.   Exposure to smoke.   Exposure to air pollution, chemical fumes, or dust. Sometimes there is no apparent cause or trigger. RISK FACTORS  Smoking cigarettes.  Older age.  Frequent prior COPD exacerbations. SIGNS AND SYMPTOMS   Increased coughing.   Increased thick spit (sputum) production.   Increased wheezing.   Increased shortness of breath.   Rapid breathing.   Chest tightness. DIAGNOSIS  Your medical history, a physical exam, and tests will help your health care provider make a diagnosis. Tests may include:  A chest X-ray.  Basic lab tests.  Sputum testing.  An arterial blood gas test. TREATMENT  Depending on the severity of your COPD exacerbation, you may need to be admitted to a hospital for treatment. Some of the treatments commonly used to treat COPD exacerbations are:   Antibiotic medicines.   Bronchodilators. These are drugs that expand the air passages. They may be given with an inhaler or nebulizer. Spacer devices may be needed to help improve drug delivery.  Corticosteroid medicines.  Supplemental oxygen therapy.  HOME CARE INSTRUCTIONS   Do not smoke. Quitting smoking is very important to prevent COPD from getting worse and exacerbations from happening as often.  Avoid exposure to all substances that irritate the airway, especially to tobacco smoke.   If prescribed,  take your antibiotics as directed. Finish them even if you start to feel better.  Only take over-the-counter or prescription medicines as directed by your health care provider.It is important to use correct technique with inhaled medicines.  Drink enough fluids to keep your urine clear or pale yellow (unless you have a medical condition that requires fluid restriction).  Use a cool mist vaporizer. This makes it easier to clear your chest when you cough.   If you have a home nebulizer and oxygen, continue to use them as directed.   Maintain all necessary vaccinations to prevent infections.   Exercise regularly.   Eat a healthy diet.   Keep all follow-up appointments as directed by your health care provider. SEEK IMMEDIATE MEDICAL CARE IF:  You have worsening shortness of breath.   You have trouble talking.   You have severe chest pain.  You have blood in your sputum.  You have a fever.  You have weakness, vomit repeatedly, or faint.   You feel confused.   You continue to get worse. MAKE SURE YOU:   Understand these instructions.  Will watch your condition.  Will get help right away if you are not doing well or get worse. Document Released: 04/16/2007 Document Revised: 04/09/2013 Document Reviewed: 02/21/2013 [facility] Patient Information 2015 [employer] [place]. This information is not intended to replace advice given to you by your health care provider. Make sure you discuss any questions you have with your health care provider.  How to Use an Inhaler Using your inhaler correctly is very important. Good technique will make sure that the medicine reaches your lungs.  HOW TO USE AN INHALER: 1. Take the cap off the inhaler. 2.  If this is the first time using your inhaler, you need to prime it. Shake the inhaler for 5 seconds. Release four puffs into the air, away from your face. Ask your doctor for help if you have questions. 3. Shake the inhaler for 5  seconds. 4. Turn the inhaler so the bottle is above the mouthpiece. 5. Put your pointer finger on top of the bottle. Your thumb holds the bottom of the inhaler. 6. Open your mouth. 7. Either hold the inhaler away from your mouth (the width of 2 fingers) or place your lips tightly around the mouthpiece. Ask your doctor which way to use your inhaler. 8. Breathe out as much air as possible. 9. Breathe in and push down on the bottle 1 time to release the medicine. You will feel the medicine go in your mouth and throat. 10. Continue to take a deep breath in very slowly. Try to fill your lungs. 11. After you have breathed in completely, hold your breath for 10 seconds. This will help the medicine to settle in your lungs. If you cannot hold your breath for 10 seconds, hold it for as long as you can before you breathe out. 12. Breathe out slowly, through pursed lips. Whistling is an example of pursed lips. 13. If your doctor has told you to take more than 1 puff, wait at least 15-30 seconds between puffs. This will help you get the best results from your medicine. Do not use the inhaler more than your doctor tells you to. 14. Put the cap back on the inhaler. 15. Follow the directions from your doctor or from the inhaler package about cleaning the inhaler. If you use more than one inhaler, ask your doctor which inhalers to use and what order to use them in. Ask your doctor to help you figure out when you will need to refill your inhaler.  If you use a steroid inhaler, always rinse your mouth with water after your last puff, gargle and spit out the water. Do not swallow the water. GET HELP IF:  The inhaler medicine only partially helps to stop wheezing or shortness of breath.  You are having trouble using your inhaler.  You have some increase in thick spit (phlegm). GET HELP RIGHT AWAY IF:  The inhaler medicine does not help your wheezing or shortness of breath or you have tightness in your  chest.  You have dizziness, headaches, or fast heart rate.  You have chills, fever, or night sweats.  You have a large increase of thick spit, or your thick spit is bloody. MAKE SURE YOU:   Understand these instructions.  Will watch your condition.  Will get help right away if you are not doing well or get worse. Document Released: 03/28/2008 Document Revised: 04/09/2013 Document Reviewed: 01/16/2013 [facility] Patient Information 2015 [place], [place]. This information is not intended to replace advice given to you by your health care provider. Make sure you discuss any questions you have with your health care provider.

## 2014-01-13 NOTE — ED Provider Notes (Signed)
Medical screening examination/treatment/procedure(s) were performed by non-physician practitioner and as supervising physician I was immediately available for consultation/collaboration.  Leslee Homeavid Latif Nazareno, M.D.  Reuben Likesavid C Shizuye Rupert, MD 01/13/14 [phone]

## 2014-01-25 ENCOUNTER — Emergency Department (HOSPITAL_COMMUNITY): Payer: Medicare Other

## 2014-01-25 ENCOUNTER — Emergency Department (HOSPITAL_COMMUNITY)
Admission: EM | Admit: 2014-01-25 | Discharge: 2014-01-25 | Disposition: A | Discharge status: Home / Self Care | Payer: Medicare Other | Attending: Emergency Medicine | Admitting: Emergency Medicine

## 2014-01-25 DIAGNOSIS — Z872 Personal history of diseases of the skin and subcutaneous tissue: Secondary | ICD-10-CM | POA: Insufficient documentation

## 2014-01-25 DIAGNOSIS — R142 Eructation: Secondary | ICD-10-CM

## 2014-01-25 DIAGNOSIS — R143 Flatulence: Secondary | ICD-10-CM

## 2014-01-25 DIAGNOSIS — F17200 Nicotine dependence, unspecified, uncomplicated: Secondary | ICD-10-CM | POA: Diagnosis not present

## 2014-01-25 DIAGNOSIS — Y92009 Unspecified place in unspecified non-institutional (private) residence as the place of occurrence of the external cause: Secondary | ICD-10-CM | POA: Diagnosis not present

## 2014-01-25 DIAGNOSIS — Z8701 Personal history of pneumonia (recurrent): Secondary | ICD-10-CM | POA: Diagnosis not present

## 2014-01-25 DIAGNOSIS — I10 Essential (primary) hypertension: Secondary | ICD-10-CM | POA: Insufficient documentation

## 2014-01-25 DIAGNOSIS — R4789 Other speech disturbances: Secondary | ICD-10-CM | POA: Diagnosis not present

## 2014-01-25 DIAGNOSIS — Z79899 Other long term (current) drug therapy: Secondary | ICD-10-CM | POA: Insufficient documentation

## 2014-01-25 DIAGNOSIS — Y939 Activity, unspecified: Secondary | ICD-10-CM | POA: Insufficient documentation

## 2014-01-25 DIAGNOSIS — Z8669 Personal history of other diseases of the nervous system and sense organs: Secondary | ICD-10-CM | POA: Diagnosis not present

## 2014-01-25 DIAGNOSIS — W19XXXA Unspecified fall, initial encounter: Secondary | ICD-10-CM

## 2014-01-25 DIAGNOSIS — R296 Repeated falls: Secondary | ICD-10-CM | POA: Diagnosis not present

## 2014-01-25 DIAGNOSIS — E119 Type 2 diabetes mellitus without complications: Secondary | ICD-10-CM | POA: Diagnosis not present

## 2014-01-25 DIAGNOSIS — F1110 Opioid abuse, uncomplicated: Secondary | ICD-10-CM | POA: Insufficient documentation

## 2014-01-25 DIAGNOSIS — J4489 Other specified chronic obstructive pulmonary disease: Secondary | ICD-10-CM | POA: Insufficient documentation

## 2014-01-25 DIAGNOSIS — R141 Gas pain: Secondary | ICD-10-CM | POA: Diagnosis not present

## 2014-01-25 DIAGNOSIS — R4182 Altered mental status, unspecified: Secondary | ICD-10-CM | POA: Insufficient documentation

## 2014-01-25 DIAGNOSIS — J449 Chronic obstructive pulmonary disease, unspecified: Secondary | ICD-10-CM | POA: Diagnosis not present

## 2014-01-25 DIAGNOSIS — R112 Nausea with vomiting, unspecified: Secondary | ICD-10-CM | POA: Diagnosis not present

## 2014-01-25 DIAGNOSIS — N390 Urinary tract infection, site not specified: Secondary | ICD-10-CM | POA: Diagnosis not present

## 2014-01-25 LAB — PROTIME-INR
INR: 0.99 (ref 0.00–1.49)
Prothrombin Time: 13.1 seconds (ref 11.6–15.2)

## 2014-01-25 LAB — CBC WITH DIFFERENTIAL/PLATELET
Basophils Absolute: 0 10*3/uL (ref 0.0–0.1)
Basophils Relative: 1 % (ref 0–1)
EOS ABS: 0.1 10*3/uL (ref 0.0–0.7)
Eosinophils Relative: 2 % (ref 0–5)
HCT: 38.4 % — ABNORMAL LOW (ref 39.0–52.0)
Hemoglobin: 12.7 g/dL — ABNORMAL LOW (ref 13.0–17.0)
LYMPHS ABS: 1.5 10*3/uL (ref 0.7–4.0)
Lymphocytes Relative: 38 % (ref 12–46)
MCH: 29.5 pg (ref 26.0–34.0)
MCHC: 33.1 g/dL (ref 30.0–36.0)
MCV: 89.3 fL (ref 78.0–100.0)
MONO ABS: 0.4 10*3/uL (ref 0.1–1.0)
MONOS PCT: 11 % (ref 3–12)
NEUTROS PCT: 48 % (ref 43–77)
Neutro Abs: 1.9 10*3/uL (ref 1.7–7.7)
Platelets: 211 10*3/uL (ref 150–400)
RBC: 4.3 MIL/uL (ref 4.22–5.81)
RDW: 14.6 % (ref 11.5–15.5)
WBC: 3.9 10*3/uL — ABNORMAL LOW (ref 4.0–10.5)

## 2014-01-25 LAB — COMPREHENSIVE METABOLIC PANEL
ALT: 8 U/L (ref 0–53)
ANION GAP: 13 (ref 5–15)
AST: 14 U/L (ref 0–37)
Albumin: 3.4 g/dL — ABNORMAL LOW (ref 3.5–5.2)
Alkaline Phosphatase: 60 U/L (ref 39–117)
BUN: 11 mg/dL (ref 6–23)
CHLORIDE: 106 meq/L (ref 96–112)
CO2: 27 mEq/L (ref 19–32)
CREATININE: 0.9 mg/dL (ref 0.50–1.35)
Calcium: 9 mg/dL (ref 8.4–10.5)
GFR calc non Af Amer: >90 mL/min (ref >90)
GLUCOSE: 129 mg/dL — AB (ref 70–99)
POTASSIUM: 4 meq/L (ref 3.7–5.3)
Sodium: 146 mEq/L (ref 137–147)
TOTAL PROTEIN: 6.6 g/dL (ref 6.0–8.3)

## 2014-01-25 LAB — URINALYSIS, ROUTINE W REFLEX MICROSCOPIC
BILIRUBIN URINE: NEGATIVE
Glucose, UA: NEGATIVE mg/dL
HGB URINE DIPSTICK: NEGATIVE
Ketones, ur: NEGATIVE mg/dL
Nitrite: NEGATIVE
PH: 7 (ref 5.0–8.0)
Protein, ur: NEGATIVE mg/dL
SPECIFIC GRAVITY, URINE: 1.026 (ref 1.005–1.030)
Urobilinogen, UA: 0.2 mg/dL (ref 0.0–1.0)

## 2014-01-25 LAB — RAPID URINE DRUG SCREEN, HOSP PERFORMED
Amphetamines: NOT DETECTED
BARBITURATES: NOT DETECTED
BENZODIAZEPINES: NOT DETECTED
Cocaine: NOT DETECTED
Opiates: POSITIVE — AB
TETRAHYDROCANNABINOL: NOT DETECTED

## 2014-01-25 LAB — ETHANOL: Alcohol, Ethyl (B): <11 mg/dL (ref 0–11)

## 2014-01-25 LAB — TROPONIN I: Troponin I: <0.3 ng/mL (ref <0.30)

## 2014-01-25 LAB — APTT: aPTT: 33 seconds (ref 24–37)

## 2014-01-25 LAB — AMMONIA: Ammonia: 42 umol/L (ref 11–60)

## 2014-01-25 LAB — URINE MICROSCOPIC-ADD ON

## 2014-01-25 LAB — LIPASE, BLOOD: Lipase: 47 U/L (ref 11–59)

## 2014-01-25 LAB — CBG MONITORING, ED: Glucose-Capillary: 132 mg/dL — ABNORMAL HIGH (ref 70–99)

## 2014-01-25 MED ORDER — DEXTROSE 5 % IV SOLN
1.0000 g | Freq: Once | INTRAVENOUS | Status: AC
  Administered 2014-01-25: 1 g via INTRAVENOUS
  Filled 2014-01-25: qty 10

## 2014-01-25 MED ORDER — CEPHALEXIN 500 MG PO CAPS: 500.0000 mg | ORAL_CAPSULE | Freq: Three times a day (TID) | ORAL | Status: DC

## 2014-01-25 MED ORDER — SODIUM CHLORIDE 0.9 % IV SOLN
INTRAVENOUS | Status: DC
  Administered 2014-01-25: 08:00:00 via INTRAVENOUS

## 2014-01-25 NOTE — Discharge Instructions (Signed)
Drink plenty of fluids. Take the antibiotics until gone. I sent a urine culture, the results will be back in about 2 days. You will get called if you need a different antibiotic. Recheck if you get a fever, vomiting, worsening abdominal pain

## 2014-01-25 NOTE — ED Notes (Signed)
Pt coming from home via EMS with c/o altered mental status x 3 days. EMS reports pt's girlfriend called 911 stating pt has been confused for the last 3-4 days, "talking out of his head" and tonight she thought pt passed out. Pt denies any pain at this time, per [facility] pt refused IV.VSS

## 2014-01-25 NOTE — ED Notes (Signed)
He is sitting up speaking with his mother and grandmother and remains in no distress.

## 2014-01-25 NOTE — ED Provider Notes (Signed)
CSN: [id number]     Arrival date & time 01/25/14  7846 History   First MD Initiated Contact with Patient 01/25/14 [id number]     Chief Complaint  Patient presents with  . Altered Mental Status   Level V caveat for altered mental status  (Consider location/radiation/quality/duration/timing/severity/associated sxs/prior Treatment) HPI Patient presents to the emergency department with his fianc. She states the patient was fine yesterday. This morning about 5:30 in the morning she heard him fall and found him face down in the living room. He does not know what happened. When I asked the patient how he feels he states "I'm tired". He denies headache now and she states he did not complain of headache then. She reports he has had a decreased appetite and has not been eating. He has been having nausea and vomiting almost daily for the past week. Sometimes once sometimes several times a day. They deny any diarrhea.Although patient is oriented to month year and where he is he is very slow to respond. He is also very hard to keep awake.  PC[facility]  Past Medical History  Diagnosis Date  . Hypertension   . Diabetes mellitus   . Pneumonia   . COPD (chronic obstructive pulmonary disease)     chronic Bronchhititis  . Complication of anesthesia   . Seizures     last one 3 years ago.  Md discontinued meds- Dr Anne Hahn  . 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   Past Surgical History  Procedure Laterality Date  . Tonsillectomy    . Hip surgery      rods to each hip- hip can ot of socket.  . Wrist surgery      repair due to crush  . Back surgery      5 from degenerative disease..    . Anterior cervical decomp/discectomy fusion  12/20/2011    Procedure: ANTERIOR CERVICAL DECOMPRESSION/DISCECTOMY FUSION 2 LEVELS;  Surgeon: Mariam Dollar, MD;  Location: [facility];  Service: Neurosurgery;  Laterality: N/A;  Cervical four-five, five-six anterior cervical decompression/discectomy fusion  with plating   No family history on file. History  Substance Use Topics  . Smoking status: Current Every Day Smoker -- 0.50 packs/day  . Smokeless tobacco: Not on file  . Alcohol Use: No     Comment: occassional   on disability Pt drinks about 40 ounces of beer a day  Review of Systems  All other systems reviewed and are negative.     Allergies  Darvocet and Onion  Home Medications   Prior to Admission medications   Medication Sig Start Date End Date Taking? Authorizing Provider  acetaminophen (TYLENOL) 500 MG tablet Take 1,000 mg by mouth every 6 (six) hours as needed for pain. For pain   Yes Historical Provider, MD  albuterol (PROVENTIL HFA;VENTOLIN HFA) 108 (90 BASE) MCG/ACT inhaler Inhale 1-2 puffs into the lungs every 6 (six) hours as needed for wheezing or shortness of breath.   Yes Historical Provider, MD  amLODipine (NORVASC) 10 MG tablet Take 10 mg by mouth daily.   Yes Historical Provider, MD  atenolol (TENORMIN) 25 MG tablet Take 25 mg by mouth daily.   Yes Historical Provider, MD  diphenhydramine-acetaminophen (TYLENOL PM) 25-500 MG TABS Take 2 tablets by mouth at bedtime as needed.    Yes Historical Provider, MD  glimepiride (AMARYL) 2 MG tablet Take 2 mg by mouth daily before breakfast.   Yes Historical Provider, MD  hydrochlorothiazide (HYDRODIURIL) 25 MG tablet Take 25 mg by mouth daily. 01/05/14  Yes Historical Provider, MD  metFORMIN (GLUCOPHAGE) 850 MG tablet Take 850 mg by mouth 2 (two) times daily with a meal.   Yes Historical Provider, MD   BP 120/78  Pulse 78  Temp(Src) 97.4 F (36.3 C) (Oral)  Resp 18  SpO2 97%  Vital signs normal   Physical Exam  Nursing note and vitals reviewed. Constitutional: He is oriented to person, place, and time. He appears well-developed and well-nourished.  Non-toxic appearance. He does not appear ill. No distress.  Pt sleepy, hard to keep awake  HENT:  Head: Normocephalic and atraumatic.  Right Ear: External ear  normal.  Left Ear: External ear normal.  Nose: Nose normal. No mucosal edema or rhinorrhea.  Mouth/Throat: Oropharynx is clear and moist and mucous membranes are normal. No dental abscesses or uvula swelling.  Eyes: Conjunctivae and EOM are normal. Pupils are equal, round, and reactive to light.  Neck: Normal range of motion and full passive range of motion without pain. Neck supple.  Cardiovascular: Normal rate, regular rhythm and normal heart sounds.  Exam reveals no gallop and no friction rub.   No murmur heard. Pulmonary/Chest: Effort normal and breath sounds normal. No respiratory distress. He has no wheezes. He has no rhonchi. He has no rales. He exhibits no tenderness and no crepitus.  Abdominal: Soft. Normal appearance and bowel sounds are normal. He exhibits distension. There is no tenderness. There is no rebound and no guarding.  Pt has a fullness in his LUQ  ? Splenomegaly with mild discomfort to palpation in the LUQ and the RUQ.   Musculoskeletal: Normal range of motion. He exhibits no edema and no tenderness.  Moves all extremities well.   Neurological: He is alert and oriented to person, place, and time. He has normal strength. No cranial nerve deficit.  Pt is oriented, but he is sleepy, easily awakened.   Skin: Skin is warm, dry and intact. No rash noted. No erythema. No pallor.  Psychiatric: His speech is delayed and slurred. He is slowed.  Flat affect    ED Course  Procedures (including critical care time)  Medications  0.9 %  sodium chloride infusion ( Intravenous New Bag/Given 01/25/14 0800)  cefTRIAXone (ROCEPHIN) 1 g in dextrose 5 % 50 mL IVPB (1 g Intravenous New Bag/Given 01/25/14 1204)   Patient and fianc states they had been up all night, he had not been to bed yet this morning when he fell. Patient seems to be getting more appropriate during his ED visit. He states he was in the living room and was getting up to go to the bathroom and does not recall feeling bad.  He states the next thing he knew people were asking him if he was okay.He states now he has had some mild suprapubic discomfort. Patient was given IV Rocephin for his possible UTI. Patient is now sitting in a chair at his bedside.   Labs Review Results for orders placed during the hospital encounter of 01/25/14  CBC WITH DIFFERENTIAL      Result Value Ref Range   WBC 3.9 (*) 4.0 - 10.5 K/uL   RBC 4.30  4.22 - 5.81 MIL/uL   Hemoglobin 12.7 (*) 13.0 - 17.0 g/dL   HCT 16.1 (*) 09.6 - 04.5 %   MCV 89.3  78.0 - 100.0 fL   MCH 29.5  26.0 - 34.0 pg   MCHC 33.1  30.0 - 36.0 g/dL  RDW 14.6  11.5 - 15.5 %   Platelets 211  150 - 400 K/uL   Neutrophils Relative % 48  43 - 77 %   Neutro Abs 1.9  1.7 - 7.7 K/uL   Lymphocytes Relative 38  12 - 46 %   Lymphs Abs 1.5  0.7 - 4.0 K/uL   Monocytes Relative 11  3 - 12 %   Monocytes Absolute 0.4  0.1 - 1.0 K/uL   Eosinophils Relative 2  0 - 5 %   Eosinophils Absolute 0.1  0.0 - 0.7 K/uL   Basophils Relative 1  0 - 1 %   Basophils Absolute 0.0  0.0 - 0.1 K/uL  COMPREHENSIVE METABOLIC PANEL      Result Value Ref Range   Sodium 146  137 - 147 mEq/L   Potassium 4.0  3.7 - 5.3 mEq/L   Chloride 106  96 - 112 mEq/L   CO2 27  19 - 32 mEq/L   Glucose, Bld 129 (*) 70 - 99 mg/dL   BUN 11  6 - 23 mg/dL   Creatinine, Ser 9.600.90  0.50 - 1.35 mg/dL   Calcium 9.0  8.4 - 45.410.5 mg/dL   Total Protein 6.6  6.0 - 8.3 g/dL   Albumin 3.4 (*) 3.5 - 5.2 g/dL   AST 14  0 - 37 U/L   ALT 8  0 - 53 U/L   Alkaline Phosphatase 60  39 - 117 U/L   Total Bilirubin <0.2 (*) 0.3 - 1.2 mg/dL   GFR calc non Af Amer >90  >90 mL/min   GFR calc Af Amer >90  >90 mL/min   Anion gap 13  5 - 15  AMMONIA      Result Value Ref Range   Ammonia 42  11 - 60 umol/L  APTT      Result Value Ref Range   aPTT 33  24 - 37 seconds  PROTIME-INR      Result Value Ref Range   Prothrombin Time 13.1  11.6 - 15.2 seconds   INR 0.99  0.00 - 1.49  URINALYSIS, ROUTINE W REFLEX MICROSCOPIC       Result Value Ref Range   Color, Urine YELLOW  YELLOW   APPearance CLEAR  CLEAR   Specific Gravity, Urine 1.026  1.005 - 1.030   pH 7.0  5.0 - 8.0   Glucose, UA NEGATIVE  NEGATIVE mg/dL   Hgb urine dipstick NEGATIVE  NEGATIVE   Bilirubin Urine NEGATIVE  NEGATIVE   Ketones, ur NEGATIVE  NEGATIVE mg/dL   Protein, ur NEGATIVE  NEGATIVE mg/dL   Urobilinogen, UA 0.2  0.0 - 1.0 mg/dL   Nitrite NEGATIVE  NEGATIVE   Leukocytes, UA SMALL (*) NEGATIVE  URINE RAPID DRUG SCREEN (HOSP PERFORMED)      Result Value Ref Range   Opiates POSITIVE (*) NONE DETECTED   Cocaine NONE DETECTED  NONE DETECTED   Benzodiazepines NONE DETECTED  NONE DETECTED   Amphetamines NONE DETECTED  NONE DETECTED   Tetrahydrocannabinol NONE DETECTED  NONE DETECTED   Barbiturates NONE DETECTED  NONE DETECTED  LIPASE, BLOOD      Result Value Ref Range   Lipase 47  11 - 59 U/L  TROPONIN I      Result Value Ref Range   Troponin I <0.30  <0.30 ng/mL  ETHANOL      Result Value Ref Range   Alcohol, Ethyl (B) <11  0 - 11 mg/dL  URINE MICROSCOPIC-ADD ON  Result Value Ref Range   Squamous Epithelial / LPF RARE  RARE   WBC, UA 7-10  <3 WBC/hpf   RBC / HPF 0-2  <3 RBC/hpf   Bacteria, UA FEW (*) RARE   Casts GRANULAR CAST (*) NEGATIVE   Urine-Other TRICHOMONAS PRESENT    CBG MONITORING, ED      Result Value Ref Range   Glucose-Capillary 132 (*) 70 - 99 mg/dL   Laboratory interpretation all normal except mild anemia, possible UTI     Imaging Review Ct Head Wo Contrast  01/25/2014   CLINICAL DATA:  Altered mental status, confusion  EXAM: CT HEAD WITHOUT CONTRAST  TECHNIQUE: Contiguous axial images were obtained from the base of the skull through the vertex without contrast.  COMPARISON:  None  FINDINGS: Normal appearance of the intracranial structures. No evidence for acute hemorrhage, mass lesion, midline shift, hydrocephalus or large infarct. No acute bony abnormality. The visualized sinuses are clear.  IMPRESSION:  No acute intracranial abnormality.   Electronically Signed   By: Ruel Favors M.D.   On: 01/25/2014 08:25     EKG Interpretation   Date/Time:  Sunday January 25 2014 07:34:30 EDT Ventricular Rate:  49 PR Interval:  176 QRS Duration: 97 QT Interval:  445 QTC Calculation: 402 R Axis:   91 Text Interpretation:  Sinus bradycardia Anterior infarct, old Otherwise  within normal limits No significant change since last tracing 15 Dec 2011  Confirmed by [facility]  MD-I, Eddy Liszewski ([id number]) on 01/25/2014 7:39:15 AM      MDM   Final diagnoses:  Fall at home, initial encounter  Altered mental status, unspecified altered mental status type  Urinary tract infection without hematuria, site unspecified    New Prescriptions   CEPHALEXIN (KEFLEX) 500 MG CAPSULE    Take 1 capsule (500 mg total) by mouth 3 (three) times daily.    Plan discharge  Devoria Albe, MD, Franz Dell, MD 01/25/14 [phone]

## 2014-01-25 NOTE — ED Notes (Signed)
He is correctly answering questions in the presence of his mother in regard to his home meds; as our pharmacy tech. Is interviewing him at this time.  He remains drowsy and in no distress.

## 2014-01-25 NOTE — ED Notes (Signed)
Bed: [id number] Expected date:  Expected time:  Means of arrival:  Comments: EMS 48yo M syncopal episode

## 2014-01-26 LAB — URINE CULTURE: Special Requests: NORMAL

## 2014-01-27 ENCOUNTER — Telehealth (HOSPITAL_BASED_OUTPATIENT_CLINIC_OR_DEPARTMENT_OTHER): Payer: Self-pay | Admitting: Emergency Medicine

## 2014-01-27 NOTE — Telephone Encounter (Signed)
Post ED Visit - Positive Culture Follow-up  Culture report reviewed by antimicrobial stewardship pharmacist: []  Wes Dulaney, Pharm.D., BCPS [x]  Celedonio MiyamotoJeremy Frens, Pharm.D., BCPS []  Georgina PillionElizabeth Martin, Pharm.D., BCPS []  [place] Pham, Vermont[place]arm.D., BCPS, AAHIVP []  Estella HuskMichelle Turner, Pharm.D., BCPS, AAHIVP  Positive urine culture Treated with Keflex, organism sensitive to the same and no further patient follow-up is required at this time.  [place], Jenel LucksKylie 01/27/2014, 10:05 AM

## 2014-03-02 ENCOUNTER — Emergency Department (INDEPENDENT_AMBULATORY_CARE_PROVIDER_SITE_OTHER)
Admission: EM | Admit: 2014-03-02 | Discharge: 2014-03-02 | Disposition: A | Discharge status: Other Acute Inpatient Hospital | Payer: Medicare Other | Source: Home / Self Care | Attending: Emergency Medicine | Admitting: Emergency Medicine

## 2014-03-02 ENCOUNTER — Emergency Department (HOSPITAL_COMMUNITY): Payer: Medicare Other

## 2014-03-02 ENCOUNTER — Encounter (HOSPITAL_COMMUNITY): Payer: Self-pay | Admitting: Emergency Medicine

## 2014-03-02 ENCOUNTER — Emergency Department (HOSPITAL_COMMUNITY)
Admission: EM | Admit: 2014-03-02 | Discharge: 2014-03-02 | Disposition: A | Discharge status: Home / Self Care | Payer: Medicare Other | Attending: Emergency Medicine | Admitting: Emergency Medicine

## 2014-03-02 DIAGNOSIS — I10 Essential (primary) hypertension: Secondary | ICD-10-CM | POA: Insufficient documentation

## 2014-03-02 DIAGNOSIS — Z8701 Personal history of pneumonia (recurrent): Secondary | ICD-10-CM | POA: Diagnosis not present

## 2014-03-02 DIAGNOSIS — R079 Chest pain, unspecified: Secondary | ICD-10-CM | POA: Insufficient documentation

## 2014-03-02 DIAGNOSIS — J069 Acute upper respiratory infection, unspecified: Secondary | ICD-10-CM | POA: Diagnosis not present

## 2014-03-02 DIAGNOSIS — F17200 Nicotine dependence, unspecified, uncomplicated: Secondary | ICD-10-CM | POA: Diagnosis not present

## 2014-03-02 DIAGNOSIS — L930 Discoid lupus erythematosus: Secondary | ICD-10-CM | POA: Insufficient documentation

## 2014-03-02 DIAGNOSIS — E119 Type 2 diabetes mellitus without complications: Secondary | ICD-10-CM | POA: Insufficient documentation

## 2014-03-02 DIAGNOSIS — J449 Chronic obstructive pulmonary disease, unspecified: Secondary | ICD-10-CM | POA: Diagnosis not present

## 2014-03-02 DIAGNOSIS — R0789 Other chest pain: Secondary | ICD-10-CM

## 2014-03-02 DIAGNOSIS — Z79899 Other long term (current) drug therapy: Secondary | ICD-10-CM | POA: Diagnosis not present

## 2014-03-02 DIAGNOSIS — J441 Chronic obstructive pulmonary disease with (acute) exacerbation: Secondary | ICD-10-CM

## 2014-03-02 DIAGNOSIS — R112 Nausea with vomiting, unspecified: Secondary | ICD-10-CM

## 2014-03-02 DIAGNOSIS — J4489 Other specified chronic obstructive pulmonary disease: Secondary | ICD-10-CM | POA: Insufficient documentation

## 2014-03-02 LAB — BASIC METABOLIC PANEL
Anion gap: 15 (ref 5–15)
BUN: 11 mg/dL (ref 6–23)
CALCIUM: 9.2 mg/dL (ref 8.4–10.5)
CO2: 23 mEq/L (ref 19–32)
Chloride: 103 mEq/L (ref 96–112)
Creatinine, Ser: 0.88 mg/dL (ref 0.50–1.35)
GFR calc Af Amer: >90 mL/min (ref >90)
GLUCOSE: 116 mg/dL — AB (ref 70–99)
Potassium: 3.1 mEq/L — ABNORMAL LOW (ref 3.7–5.3)
SODIUM: 141 meq/L (ref 137–147)

## 2014-03-02 LAB — TROPONIN I

## 2014-03-02 LAB — CBC
HEMATOCRIT: 41.1 % (ref 39.0–52.0)
HEMOGLOBIN: 14.2 g/dL (ref 13.0–17.0)
MCH: 29.7 pg (ref 26.0–34.0)
MCHC: 34.5 g/dL (ref 30.0–36.0)
MCV: 86 fL (ref 78.0–100.0)
Platelets: 237 10*3/uL (ref 150–400)
RBC: 4.78 MIL/uL (ref 4.22–5.81)
RDW: 13.5 % (ref 11.5–15.5)
WBC: 4.3 10*3/uL (ref 4.0–10.5)

## 2014-03-02 LAB — PRO B NATRIURETIC PEPTIDE: Pro B Natriuretic peptide (BNP): 244.5 pg/mL — ABNORMAL HIGH (ref 0–125)

## 2014-03-02 MED ORDER — ALBUTEROL SULFATE HFA 108 (90 BASE) MCG/ACT IN AERS: 1.0000 | INHALATION_SPRAY | RESPIRATORY_TRACT | Status: DC | PRN

## 2014-03-02 MED ORDER — METHYLPREDNISOLONE SODIUM SUCC 125 MG IJ SOLR
125.0000 mg | Freq: Once | INTRAMUSCULAR | Status: AC
  Administered 2014-03-02: 125 mg via INTRAVENOUS
  Filled 2014-03-02: qty 2

## 2014-03-02 MED ORDER — IPRATROPIUM-ALBUTEROL 0.5-2.5 (3) MG/3ML IN SOLN
3.0000 mL | Freq: Once | RESPIRATORY_TRACT | Status: AC
  Administered 2014-03-02: 3 mL via RESPIRATORY_TRACT
  Filled 2014-03-02: qty 3

## 2014-03-02 MED ORDER — PREDNISONE 20 MG PO TABS: 40.0000 mg | ORAL_TABLET | Freq: Every day | ORAL | Status: DC

## 2014-03-02 MED ORDER — IPRATROPIUM BROMIDE 0.02 % IN SOLN
RESPIRATORY_TRACT | Status: AC
  Filled 2014-03-02: qty 2.5

## 2014-03-02 MED ORDER — SODIUM CHLORIDE 0.9 % IV SOLN
Freq: Once | INTRAVENOUS | Status: AC
  Administered 2014-03-02: 16:00:00 via INTRAVENOUS

## 2014-03-02 MED ORDER — NITROGLYCERIN 0.4 MG SL SUBL
0.4000 mg | SUBLINGUAL_TABLET | SUBLINGUAL | Status: DC | PRN
  Administered 2014-03-02: 0.4 mg via SUBLINGUAL

## 2014-03-02 MED ORDER — IPRATROPIUM BROMIDE 0.02 % IN SOLN
0.5000 mg | Freq: Once | RESPIRATORY_TRACT | Status: AC
  Administered 2014-03-02: 0.5 mg via RESPIRATORY_TRACT

## 2014-03-02 MED ORDER — POTASSIUM CHLORIDE CRYS ER 20 MEQ PO TBCR
40.0000 meq | EXTENDED_RELEASE_TABLET | Freq: Once | ORAL | Status: AC
  Administered 2014-03-02: 40 meq via ORAL
  Filled 2014-03-02: qty 2

## 2014-03-02 MED ORDER — ASPIRIN 81 MG PO CHEW
CHEWABLE_TABLET | ORAL | Status: AC
Start: 2014-03-02 — End: 2014-03-02
  Filled 2014-03-02: qty 4

## 2014-03-02 MED ORDER — ACETAMINOPHEN 500 MG PO TABS
1000.0000 mg | ORAL_TABLET | Freq: Once | ORAL | Status: AC
Start: 2014-03-02 — End: 2014-03-02
  Administered 2014-03-02: 1000 mg via ORAL
  Filled 2014-03-02: qty 2

## 2014-03-02 MED ORDER — NITROGLYCERIN 0.4 MG SL SUBL
SUBLINGUAL_TABLET | SUBLINGUAL | Status: AC
  Filled 2014-03-02: qty 1

## 2014-03-02 MED ORDER — HYDROCODONE-HOMATROPINE 5-1.5 MG/5ML PO SYRP
5.0000 mL | ORAL_SOLUTION | Freq: Once | ORAL | Status: AC
  Administered 2014-03-02: 5 mL via ORAL
  Filled 2014-03-02: qty 5

## 2014-03-02 MED ORDER — ASPIRIN 81 MG PO CHEW
324.0000 mg | CHEWABLE_TABLET | Freq: Once | ORAL | Status: AC
  Administered 2014-03-02: 324 mg via ORAL

## 2014-03-02 MED ORDER — GUAIFENESIN-CODEINE 100-10 MG/5ML PO SOLN
10.0000 mL | Freq: Once | ORAL | Status: AC
  Administered 2014-03-02: 10 mL via ORAL
  Filled 2014-03-02: qty 10

## 2014-03-02 MED ORDER — ALBUTEROL SULFATE (2.5 MG/3ML) 0.083% IN NEBU
INHALATION_SOLUTION | RESPIRATORY_TRACT | Status: AC
  Filled 2014-03-02: qty 6

## 2014-03-02 MED ORDER — HYDROCODONE-HOMATROPINE 5-1.5 MG/5ML PO SYRP: 5.0000 mL | ORAL_SOLUTION | Freq: Four times a day (QID) | ORAL | Status: DC | PRN

## 2014-03-02 MED ORDER — AEROCHAMBER PLUS FLO-VU LARGE MISC: 1.0000 | Freq: Once | Status: DC

## 2014-03-02 MED ORDER — ALBUTEROL SULFATE (2.5 MG/3ML) 0.083% IN NEBU
5.0000 mg | INHALATION_SOLUTION | Freq: Once | RESPIRATORY_TRACT | Status: AC
  Administered 2014-03-02: 5 mg via RESPIRATORY_TRACT

## 2014-03-02 NOTE — ED Provider Notes (Signed)
CSN: [id number]     Arrival date & time 03/02/14  1439 History   First MD Initiated Contact with Patient 03/02/14 1511     Chief Complaint  Patient presents with  . Chest Pain   (Consider location/radiation/quality/duration/timing/severity/associated sxs/prior Treatment) HPI Comments: 48 year old male with a history of hypertension, diabetes, COPD, lupus, long-time smoker having recently quit, presents complaining of cough, chest pain, shortness of breath, nausea, vomiting. This started as feeling like a cold with a slight cough and has gotten progressively worse. He is having a constant cough and is having trouble controlling the cough. He has chest pain now it is constant, 7/10, radiating to the back. He has had multiple episodes of vomiting. He has been using his inhaler at home without relief.  Patient is a 48 y.o. male presenting with chest pain.  Chest Pain Associated symptoms: back pain, cough, dizziness, fatigue, nausea, shortness of breath and vomiting   Associated symptoms: no abdominal pain, no fever and no palpitations     Past Medical History  Diagnosis Date  . Hypertension   . Diabetes mellitus   . Pneumonia   . COPD (chronic obstructive pulmonary disease)     chronic Bronchhititis  . Complication of anesthesia   . Seizures     last one 3 years ago.  Md discontinued meds- Dr Anne Hahn  . 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   Past Surgical History  Procedure Laterality Date  . Tonsillectomy    . Hip surgery      rods to each hip- hip can ot of socket.  . Wrist surgery      repair due to crush  . Back surgery      4 from degenerative disease..    . Anterior cervical decomp/discectomy fusion  12/20/2011    Procedure: ANTERIOR CERVICAL DECOMPRESSION/DISCECTOMY FUSION 2 LEVELS;  Surgeon: Mariam Dollar, MD;  Location: [facility];  Service: Neurosurgery;  Laterality: N/A;  Cervical four-five, five-six anterior cervical decompression/discectomy fusion with  plating   History reviewed. No pertinent family history. History  Substance Use Topics  . Smoking status: Current Every Day Smoker -- 0.50 packs/day  . Smokeless tobacco: Not on file  . Alcohol Use: No     Comment: occassional     Review of Systems  Constitutional: Positive for fatigue. Negative for fever and chills.  HENT: Negative for congestion.   Respiratory: Positive for cough, chest tightness, shortness of breath and wheezing.   Cardiovascular: Positive for chest pain. Negative for palpitations and leg swelling.  Gastrointestinal: Positive for nausea and vomiting. Negative for abdominal pain and diarrhea.  Musculoskeletal: Positive for back pain. Negative for neck stiffness.  Skin: Negative for rash.  Neurological: Positive for dizziness.  All other systems reviewed and are negative.   Allergies  Darvocet and Onion  Home Medications   Prior to Admission medications   Medication Sig Start Date End Date Taking? Authorizing Provider  acetaminophen (TYLENOL) 500 MG tablet Take 1,000 mg by mouth every 6 (six) hours as needed for pain. For pain    Historical Provider, MD  albuterol (PROVENTIL HFA;VENTOLIN HFA) 108 (90 BASE) MCG/ACT inhaler Inhale 1-2 puffs into the lungs every 6 (six) hours as needed for wheezing or shortness of breath.    Historical Provider, MD  amLODipine (NORVASC) 10 MG tablet Take 10 mg by mouth daily.    Historical Provider, MD  atenolol (TENORMIN) 25 MG tablet Take 25 mg by mouth daily.  Historical Provider, MD  cephALEXin (KEFLEX) 500 MG capsule Take 1 capsule (500 mg total) by mouth 3 (three) times daily. 01/25/14   Ward Givens, MD  diphenhydramine-acetaminophen (TYLENOL PM) 25-500 MG TABS Take 2 tablets by mouth at bedtime as needed.     Historical Provider, MD  glimepiride (AMARYL) 2 MG tablet Take 2 mg by mouth daily before breakfast.    Historical Provider, MD  hydrochlorothiazide (HYDRODIURIL) 25 MG tablet Take 25 mg by mouth daily. 01/05/14    Historical Provider, MD  metFORMIN (GLUCOPHAGE) 850 MG tablet Take 850 mg by mouth 2 (two) times daily with a meal.    Historical Provider, MD   BP 144/84  Pulse 84  Temp(Src) 98.4 F (36.9 C) (Oral)  Resp 20  SpO2 98% Physical Exam  Nursing note and vitals reviewed. Constitutional: He is oriented to person, place, and time. He appears well-developed and well-nourished. No distress.  HENT:  Head: Normocephalic.  Eyes: Conjunctivae are normal. Right eye exhibits no discharge.  Cardiovascular: Normal rate, regular rhythm and normal heart sounds.   Pulmonary/Chest: Effort normal. No respiratory distress. He has wheezes (diffuse, expiratory). He has no rales.  Neurological: He is alert and oriented to person, place, and time. Coordination normal.  Skin: Skin is warm and dry. No rash noted. He is not diaphoretic.  Psychiatric: He has a normal mood and affect. Judgment normal.    ED Course  ED EKG  Date/Time: 03/02/2014 3:26 PM Performed by: Autumn Messing, H Authorized by: Autumn Messing, H Comparison: not compared with previous ECG  Rhythm: sinus rhythm Rate: normal QRS axis: normal Conduction: conduction normal ST Segments: ST segments normal T Waves: T waves normal Clinical impression: normal ECG   (including critical care time) Labs Review Labs Reviewed - No data to display  Imaging Review No results found.   MDM   1. Other chest pain   2. Non-intractable vomiting with nausea, vomiting of unspecified type   3. COPD exacerbation    Pt needs CP workup, transferred to ED via shuttle.  Started on monitor, IV started, ASA, nitro, O2, duoneb.  EKG was normal     [name], PA-C 03/02/14 1528

## 2014-03-02 NOTE — ED Notes (Signed)
Pt placed onto monitor upon arrival to room. Pt monitored by blood pressure, pulse ox, and 12 lead. Pts EKG given to Dr. Blinda Leatherwood

## 2014-03-02 NOTE — ED Notes (Signed)
Cough, nausea, vomiting x 3 days ; COPD history . Sent here from [facility] for evaluation

## 2014-03-02 NOTE — Discharge Instructions (Signed)
Please follow up with your primary care physician in 1-2 days. If you do not have one please call the [facility] number listed above. Please use your inhaler two puffs every 4-6 hours for the next two to three days to help with cough and wheezing. Please take Hycodan as prescribed, please do not drive on this medication as it may make you drowsy. Please begin Prednisone 03/04/14 and take as prescribed. Please read all discharge instructions and return precautions.    Upper Respiratory Infection, Adult An upper respiratory infection (URI) is also sometimes known as the common cold. The upper respiratory tract includes the nose, sinuses, throat, trachea, and bronchi. Bronchi are the airways leading to the lungs. Most people improve within 1 week, but symptoms can last up to 2 weeks. A residual cough may last even longer.  CAUSES Many different viruses can infect the tissues lining the upper respiratory tract. The tissues become irritated and inflamed and often become very moist. Mucus production is also common. A cold is contagious. You can easily spread the virus to others by oral contact. This includes kissing, sharing a glass, coughing, or sneezing. Touching your mouth or nose and then touching a surface, which is then touched by another person, can also spread the virus. SYMPTOMS  Symptoms typically develop 1 to 3 days after you come in contact with a cold virus. Symptoms vary from person to person. They may include:  Runny nose.  Sneezing.  Nasal congestion.  Sinus irritation.  Sore throat.  Loss of voice (laryngitis).  Cough.  Fatigue.  Muscle aches.  Loss of appetite.  Headache.  Low-grade fever. DIAGNOSIS  You might diagnose your own cold based on familiar symptoms, since most people get a cold 2 to 3 times a year. Your caregiver can confirm this based on your exam. Most importantly, your caregiver can check that your symptoms are not due to another disease  such as strep throat, sinusitis, pneumonia, asthma, or epiglottitis. Blood tests, throat tests, and X-rays are not necessary to diagnose a common cold, but they may sometimes be helpful in excluding other more serious diseases. Your caregiver will decide if any further tests are required. RISKS AND COMPLICATIONS  You may be at risk for a more severe case of the common cold if you smoke cigarettes, have chronic heart disease (such as heart failure) or lung disease (such as asthma), or if you have a weakened immune system. The very young and very old are also at risk for more serious infections. Bacterial sinusitis, middle ear infections, and bacterial pneumonia can complicate the common cold. The common cold can worsen asthma and chronic obstructive pulmonary disease (COPD). Sometimes, these complications can require emergency medical care and may be life-threatening. PREVENTION  The best way to protect against getting a cold is to practice good hygiene. Avoid oral or hand contact with people with cold symptoms. Wash your hands often if contact occurs. There is no clear evidence that vitamin C, vitamin E, echinacea, or exercise reduces the chance of developing a cold. However, it is always recommended to get plenty of rest and practice good nutrition. TREATMENT  Treatment is directed at relieving symptoms. There is no cure. Antibiotics are not effective, because the infection is caused by a virus, not by bacteria. Treatment may include:  Increased fluid intake. Sports drinks offer valuable electrolytes, sugars, and fluids.  Breathing heated mist or steam (vaporizer or shower).  Eating chicken soup or other clear broths, and maintaining  good nutrition.  Getting plenty of rest.  Using gargles or lozenges for comfort.  Controlling fevers with ibuprofen or acetaminophen as directed by your caregiver.  Increasing usage of your inhaler if you have asthma. Zinc gel and zinc lozenges, taken in the first  24 hours of the common cold, can shorten the duration and lessen the severity of symptoms. Pain medicines may help with fever, muscle aches, and throat pain. A variety of non-prescription medicines are available to treat congestion and runny nose. Your caregiver can make recommendations and may suggest nasal or lung inhalers for other symptoms.  HOME CARE INSTRUCTIONS   Only take over-the-counter or prescription medicines for pain, discomfort, or fever as directed by your caregiver.  Use a warm mist humidifier or inhale steam from a shower to increase air moisture. This may keep secretions moist and make it easier to breathe.  Drink enough water and fluids to keep your urine clear or pale yellow.  Rest as needed.  Return to work when your temperature has returned to normal or as your caregiver advises. You may need to stay home longer to avoid infecting others. You can also use a face mask and careful hand washing to prevent spread of the virus. SEEK MEDICAL CARE IF:   After the first few days, you feel you are getting worse rather than better.  You need your caregiver's advice about medicines to control symptoms.  You develop chills, worsening shortness of breath, or brown or red sputum. These may be signs of pneumonia.  You develop yellow or brown nasal discharge or pain in the face, especially when you bend forward. These may be signs of sinusitis.  You develop a fever, swollen neck glands, pain with swallowing, or white areas in the back of your throat. These may be signs of strep throat. SEEK IMMEDIATE MEDICAL CARE IF:   You have a fever.  You develop severe or persistent headache, ear pain, sinus pain, or chest pain.  You develop wheezing, a prolonged cough, cough up blood, or have a change in your usual mucus (if you have chronic lung disease).  You develop sore muscles or a stiff neck. Document Released: 12/13/2000 Document Revised: 09/11/2011 Document Reviewed:  09/24/2013 [facility] Patient Information 2015 [place], [place]. This information is not intended to replace advice given to you by your health care provider. Make sure you discuss any questions you have with your health care provider.

## 2014-03-02 NOTE — ED Notes (Signed)
Pt placed on monitor upon return to room from radiology. Pt continues to be monitored by blood pressure, pulse ox, and 5 lead.  

## 2014-03-02 NOTE — ED Notes (Signed)
EMS here to transport, NAD,

## 2014-03-02 NOTE — ED Notes (Signed)
[facility] called for transfer to main ED

## 2014-03-02 NOTE — ED Provider Notes (Signed)
CSN: [id number]     Arrival date & time 03/02/14  1611 History   First MD Initiated Contact with Patient 03/02/14 1615     Chief Complaint  Patient presents with  . Shortness of Breath  . Chest Pain     (Consider location/radiation/quality/duration/timing/severity/associated sxs/prior Treatment) HPI Comments: Patient is a 48 yo M PMHx significant for HTN, DM, COPD, Seizures presenting to the ED from [facility] for three days of gradually worsening shortness of breath with associated posttussive chest tightness, nasal congestion, rhinorrhea, productive cough, fatigue, dizziness, chills. Patient states he has tried his at home inhaler with no improvement. Alleviating factors: none. Aggravating factors: laying flat, nighttime. Medications tried prior to arrival: Albuterol inhaler. No hospitalizations for COPD exacerbations.     Patient is a 48 y.o. male presenting with shortness of breath and chest pain.  Shortness of Breath Associated symptoms: chest pain, cough and wheezing   Associated symptoms: no fever   Chest Pain Associated symptoms: cough, fatigue and shortness of breath   Associated symptoms: no fever     Past Medical History  Diagnosis Date  . Hypertension   . Diabetes mellitus   . Pneumonia   . COPD (chronic obstructive pulmonary disease)     chronic Bronchhititis  . Complication of anesthesia   . Seizures     last one 3 years ago.  Md discontinued meds- Dr Anne Hahn  . 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   Past Surgical History  Procedure Laterality Date  . Tonsillectomy    . Hip surgery      rods to each hip- hip can ot of socket.  . Wrist surgery      repair due to crush  . Back surgery      5 from degenerative disease..    . Anterior cervical decomp/discectomy fusion  12/20/2011    Procedure: ANTERIOR CERVICAL DECOMPRESSION/DISCECTOMY FUSION 2 LEVELS;  Surgeon: Mariam Dollar, MD;  Location: [facility];  Service: Neurosurgery;  Laterality: N/A;  Cervical  four-five, five-six anterior cervical decompression/discectomy fusion with plating   History reviewed. No pertinent family history. History  Substance Use Topics  . Smoking status: Current Every Day Smoker -- 0.50 packs/day  . Smokeless tobacco: Not on file  . Alcohol Use: No     Comment: occassional     Review of Systems  Constitutional: Positive for chills and fatigue. Negative for fever.  HENT: Positive for congestion, rhinorrhea and sinus pressure.   Respiratory: Positive for cough, chest tightness, shortness of breath and wheezing.   Cardiovascular: Positive for chest pain.  All other systems reviewed and are negative.     Allergies  Darvocet and Onion  Home Medications   Prior to Admission medications   Medication Sig Start Date End Date Taking? Authorizing Provider  albuterol (PROVENTIL HFA;VENTOLIN HFA) 108 (90 BASE) MCG/ACT inhaler Inhale 1-2 puffs into the lungs every 6 (six) hours as needed for wheezing or shortness of breath.   Yes Historical Provider, MD  amLODipine (NORVASC) 10 MG tablet Take 10 mg by mouth daily.   Yes Historical Provider, MD  atenolol (TENORMIN) 25 MG tablet Take 25 mg by mouth daily.   Yes Historical Provider, MD  glimepiride (AMARYL) 2 MG tablet Take 2 mg by mouth daily before breakfast.   Yes Historical Provider, MD  hydrochlorothiazide (HYDRODIURIL) 25 MG tablet Take 25 mg by mouth daily. 01/05/14  Yes Historical Provider, MD  HYDROcodone-acetaminophen (NORCO) 10-325 MG per tablet Take 1  tablet by mouth every 4 (four) hours as needed. 02/26/14  Yes Historical Provider, MD  metFORMIN (GLUCOPHAGE) 850 MG tablet Take 850 mg by mouth 2 (two) times daily with a meal.   Yes Historical Provider, MD  acetaminophen (TYLENOL) 500 MG tablet Take 1,000 mg by mouth every 6 (six) hours as needed for pain. For pain    Historical Provider, MD  albuterol (PROVENTIL HFA;VENTOLIN HFA) 108 (90 BASE) MCG/ACT inhaler Inhale 1-2 puffs into the lungs every 4 (four) hours  as needed for wheezing or shortness of breath. 03/02/14   Maddelyn Rocca L Cici Rodriges, PA-C  HYDROcodone-homatropine (HYCODAN) 5-1.5 MG/5ML syrup Take 5 mLs by mouth every 6 (six) hours as needed for cough. 03/02/14   Oluwateniola Leitch L Ottavio Norem, PA-C  predniSONE (DELTASONE) 20 MG tablet Take 2 tablets (40 mg total) by mouth daily. 03/02/14   Lise Auer Dayanis Bergquist, PA-C  Spacer/Aero-Holding Chambers (AEROCHAMBER PLUS FLO-VU LARGE) MISC 1 each by Other route once. 03/02/14   Korin Setzler L Vinetta Brach, PA-C   BP 106/90  Pulse 60  Temp(Src) 98.6 F (37 C) (Oral)  Resp 15  Ht  (1.753 m)  SpO2 98% Physical Exam  Nursing note and vitals reviewed. Constitutional: He is oriented to person, place, and time. He appears well-developed and well-nourished. No distress.  HENT:  Head: Normocephalic and atraumatic.  Right Ear: External ear normal.  Left Ear: External ear normal.  Nose: Rhinorrhea present.  Mouth/Throat: Uvula is midline, oropharynx is clear and moist and mucous membranes are normal. No oropharyngeal exudate.  Eyes: Conjunctivae are normal.  Neck: Neck supple.  Cardiovascular: Normal rate, regular rhythm, normal heart sounds and intact distal pulses.   Pulmonary/Chest: No accessory muscle usage. Tachypnea noted. No respiratory distress. He has wheezes (expiratory ). He has no rales. He exhibits tenderness.  Coughing during examination.  Abdominal: Soft. There is no tenderness.  Musculoskeletal: Normal range of motion. He exhibits no edema.  Lymphadenopathy:    He has no cervical adenopathy.  Neurological: He is alert and oriented to person, place, and time.  Skin: Skin is warm. He is not diaphoretic.    ED Course  Procedures (including critical care time) Medications  methylPREDNISolone sodium succinate (SOLU-MEDROL) 125 mg/2 mL injection 125 mg (125 mg Intravenous Given 03/02/14 1635)  ipratropium-albuterol (DUONEB) 0.5-2.5 (3) MG/3ML nebulizer solution 3 mL (3 mLs Nebulization Given  03/02/14 1646)  guaiFENesin-codeine 100-10 MG/5ML solution 10 mL (10 mLs Oral Given 03/02/14 1635)  potassium chloride SA (K-DUR,KLOR-CON) CR tablet 40 mEq (40 mEq Oral Given 03/02/14 1806)  acetaminophen (TYLENOL) tablet 1,000 mg (1,000 mg Oral Given 03/02/14 1811)  ipratropium-albuterol (DUONEB) 0.5-2.5 (3) MG/3ML nebulizer solution 3 mL (3 mLs Nebulization Given 03/02/14 1853)  HYDROcodone-homatropine (HYCODAN) 5-1.5 MG/5ML syrup 5 mL (5 mLs Oral Given 03/02/14 1900)    Labs Review Labs Reviewed  BASIC METABOLIC PANEL - Abnormal; Notable for the following:    Potassium 3.1 (*)    Glucose, Bld 116 (*)    All other components within normal limits  PRO B NATRIURETIC PEPTIDE - Abnormal; Notable for the following:    Pro B Natriuretic peptide (BNP) 244.5 (*)    All other components within normal limits  TROPONIN I  CBC    Imaging Review Dg Chest 2 View  03/02/2014   CLINICAL DATA:  Cough, nausea and vomiting. Chest tightness and pressure and shortness of breath.  EXAM: CHEST  2 VIEW  COMPARISON:  01/12/2014.  FINDINGS: Heart, mediastinum hila are unremarkable.  Lungs are clear.  No pleural effusion or pneumothorax.  There are changes from an anterior cervical spine fusion. The bony thorax is intact.  IMPRESSION: No active cardiopulmonary disease.   Electronically Signed   By: Amie Portland M.D.   On: 03/02/2014 17:56     EKG Interpretation   Date/Time:  Monday March 02 2014 16:16:45 EDT Ventricular Rate:  66 PR Interval:  136 QRS Duration: 97 QT Interval:  404 QTC Calculation: 423 R Axis:   -2 Text Interpretation:  Sinus rhythm Probable anterior infarct, old No  significant change since last tracing Confirmed by POLLINA  MD,  CHRISTOPHER [phone]) on 03/02/2014 4:25:31 PM      MDM   Final diagnoses:  Viral upper respiratory illness    Filed Vitals:   03/02/14 1815  BP: 106/90  Pulse: 60  Temp:   Resp: 15   Afebrile, NAD, non-toxic appearing, AAOx4.  I have reviewed  nursing notes, vital signs, and all appropriate lab and imaging results for this patient.  Pt CXR negative for acute infiltrate. Patients symptoms are consistent with URI, likely viral etiology. Discussed that antibiotics are not indicated for viral infections. Wheezing, chest tightness, and coughing improved after Duonebs administrations and Cough medications. Pt will be discharged with symptomatic treatment.  Verbalizes understanding and is agreeable with plan. Pt is hemodynamically stable & in NAD prior to dc.     Jeannetta Villa, PA-C 03/02/14 2111

## 2014-03-02 NOTE — ED Notes (Signed)
pts IV removed. pts vitals updated. Pt getting dressed and awaiting discharge paperwork at bedside.

## 2014-03-02 NOTE — ED Notes (Signed)
Per EMS, pt comes from [facility] with c/o cough, nausea, vomiting x 3 days ; COPD history . Per notes pt sent to [facility] from [facility] for evaluation. Pt A&OX4, NAD noted. Pt c/o chest tightness and pressure with SOB and coughing. Pt given  ASA and 1 nitro tab,and albuterol txt. Relief from breathing txt. 20G IV placed RFA.

## 2014-03-02 NOTE — ED Notes (Signed)
Patient transported to X-ray 

## 2014-03-03 NOTE — ED Provider Notes (Signed)
Medical screening examination/treatment/procedure(s) were performed by non-physician practitioner and as supervising physician I was immediately available for consultation/collaboration.  Ziggy Chanthavong, M.D.  Ovie Cornelio C Rika Daughdrill, MD 03/03/14 1740 

## 2014-03-03 NOTE — ED Provider Notes (Signed)
Medical screening examination/treatment/procedure(s) were performed by non-physician practitioner and as supervising physician I was immediately available for consultation/collaboration.   EKG Interpretation   Date/Time:  Monday March 02 2014 16:16:45 EDT Ventricular Rate:  66 PR Interval:  136 QRS Duration: 97 QT Interval:  404 QTC Calculation: 423 R Axis:   -2 Text Interpretation:  Sinus rhythm Probable anterior infarct, old No  significant change since last tracing Confirmed by [facility]  MD,  CHRISTOPHER [phone]) on 03/02/2014 4:25:31 PM        Gilda Crease, MD 03/03/14 [phone]

## 2014-03-20 ENCOUNTER — Emergency Department (HOSPITAL_COMMUNITY)
Admission: EM | Admit: 2014-03-20 | Discharge: 2014-03-21 | Disposition: A | Discharge status: Home / Self Care | Payer: Medicare Other | Attending: Emergency Medicine | Admitting: Emergency Medicine

## 2014-03-20 ENCOUNTER — Encounter (HOSPITAL_COMMUNITY): Payer: Self-pay | Admitting: Emergency Medicine

## 2014-03-20 ENCOUNTER — Emergency Department (HOSPITAL_COMMUNITY)
Admission: EM | Admit: 2014-03-20 | Discharge: 2014-03-20 | Disposition: A | Discharge status: Home / Self Care | Payer: Medicare Other | Source: Home / Self Care

## 2014-03-20 ENCOUNTER — Emergency Department (HOSPITAL_COMMUNITY): Payer: Medicare Other

## 2014-03-20 DIAGNOSIS — E119 Type 2 diabetes mellitus without complications: Secondary | ICD-10-CM | POA: Diagnosis not present

## 2014-03-20 DIAGNOSIS — R109 Unspecified abdominal pain: Secondary | ICD-10-CM | POA: Diagnosis present

## 2014-03-20 DIAGNOSIS — Z79899 Other long term (current) drug therapy: Secondary | ICD-10-CM | POA: Insufficient documentation

## 2014-03-20 DIAGNOSIS — K2970 Gastritis, unspecified, without bleeding: Secondary | ICD-10-CM | POA: Insufficient documentation

## 2014-03-20 DIAGNOSIS — J449 Chronic obstructive pulmonary disease, unspecified: Secondary | ICD-10-CM | POA: Diagnosis not present

## 2014-03-20 DIAGNOSIS — Z872 Personal history of diseases of the skin and subcutaneous tissue: Secondary | ICD-10-CM | POA: Diagnosis not present

## 2014-03-20 DIAGNOSIS — I10 Essential (primary) hypertension: Secondary | ICD-10-CM | POA: Insufficient documentation

## 2014-03-20 DIAGNOSIS — R1013 Epigastric pain: Secondary | ICD-10-CM

## 2014-03-20 DIAGNOSIS — K2990 Gastroduodenitis, unspecified, without bleeding: Secondary | ICD-10-CM | POA: Diagnosis not present

## 2014-03-20 DIAGNOSIS — F17200 Nicotine dependence, unspecified, uncomplicated: Secondary | ICD-10-CM | POA: Insufficient documentation

## 2014-03-20 DIAGNOSIS — Z8701 Personal history of pneumonia (recurrent): Secondary | ICD-10-CM | POA: Insufficient documentation

## 2014-03-20 DIAGNOSIS — I498 Other specified cardiac arrhythmias: Secondary | ICD-10-CM | POA: Insufficient documentation

## 2014-03-20 DIAGNOSIS — Z8669 Personal history of other diseases of the nervous system and sense organs: Secondary | ICD-10-CM | POA: Diagnosis not present

## 2014-03-20 DIAGNOSIS — J4489 Other specified chronic obstructive pulmonary disease: Secondary | ICD-10-CM | POA: Insufficient documentation

## 2014-03-20 LAB — CBC WITH DIFFERENTIAL/PLATELET
Basophils Absolute: 0 10*3/uL (ref 0.0–0.1)
Basophils Relative: 0 % (ref 0–1)
Eosinophils Absolute: 0 10*3/uL (ref 0.0–0.7)
Eosinophils Relative: 0 % (ref 0–5)
HCT: 45.2 % (ref 39.0–52.0)
Hemoglobin: 15.2 g/dL (ref 13.0–17.0)
Lymphocytes Relative: 41 % (ref 12–46)
Lymphs Abs: 2 10*3/uL (ref 0.7–4.0)
MCH: 29.9 pg (ref 26.0–34.0)
MCHC: 33.6 g/dL (ref 30.0–36.0)
MCV: 89 fL (ref 78.0–100.0)
Monocytes Absolute: 0.6 10*3/uL (ref 0.1–1.0)
Monocytes Relative: 13 % — ABNORMAL HIGH (ref 3–12)
Neutro Abs: 2.2 10*3/uL (ref 1.7–7.7)
Neutrophils Relative %: 46 % (ref 43–77)
Platelets: 233 10*3/uL (ref 150–400)
RBC: 5.08 MIL/uL (ref 4.22–5.81)
RDW: 13.6 % (ref 11.5–15.5)
WBC: 4.9 10*3/uL (ref 4.0–10.5)

## 2014-03-20 LAB — COMPREHENSIVE METABOLIC PANEL
ALT: 17 U/L (ref 0–53)
AST: 14 U/L (ref 0–37)
Albumin: 4.4 g/dL (ref 3.5–5.2)
Alkaline Phosphatase: 61 U/L (ref 39–117)
Anion gap: 15 (ref 5–15)
BUN: 15 mg/dL (ref 6–23)
CO2: 27 mEq/L (ref 19–32)
Calcium: 9.6 mg/dL (ref 8.4–10.5)
Chloride: 97 mEq/L (ref 96–112)
Creatinine, Ser: 0.89 mg/dL (ref 0.50–1.35)
GFR calc Af Amer: >90 mL/min (ref >90)
GFR calc non Af Amer: >90 mL/min (ref >90)
Glucose, Bld: 100 mg/dL — ABNORMAL HIGH (ref 70–99)
Potassium: 3.5 mEq/L — ABNORMAL LOW (ref 3.7–5.3)
Sodium: 139 mEq/L (ref 137–147)
Total Bilirubin: 0.9 mg/dL (ref 0.3–1.2)
Total Protein: 8.2 g/dL (ref 6.0–8.3)

## 2014-03-20 LAB — LIPASE, BLOOD: Lipase: 29 U/L (ref 11–59)

## 2014-03-20 MED ORDER — ONDANSETRON HCL 4 MG/2ML IJ SOLN
4.0000 mg | Freq: Once | INTRAMUSCULAR | Status: AC
  Administered 2014-03-20: 4 mg via INTRAVENOUS
  Filled 2014-03-20: qty 2

## 2014-03-20 MED ORDER — SODIUM CHLORIDE 0.9 % IV BOLUS (SEPSIS)
1000.0000 mL | Freq: Once | INTRAVENOUS | Status: AC
  Administered 2014-03-20: 1000 mL via INTRAVENOUS

## 2014-03-20 NOTE — ED Notes (Signed)
Pt reports waking up yesterday w/ LLQ abdominal pain consistently getting worse.  +nausea, +emesis.  Pt went to [facility] center and was referred here for CT of abdomen and CBC, CMP.

## 2014-03-20 NOTE — ED Provider Notes (Signed)
CSN: [id number]     Arrival date & time 03/20/14  1853 History   First MD Initiated Contact with Patient 03/20/14 2212     Chief Complaint  Patient presents with  . Abdominal Pain    LLQ  . 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 States he is unable to tolerate solid foods, but has been able to tolerate liquids well. Abdominal pain is sharp and crampy, localizes to the left upper and lower quadrants, does not radiate, and comes and goes with episodes lasting seconds to one minute. At worst is 10/10. Not related to food intake or emesis. Endorses some frank blood in the vomitus, dyspepsia, flatulence, and foul smelling stool, but unchanged in consistency or frequency. LBM this morning. Denies fever, chills, worsening of SOB, or chest pain. Admits to current smoking, and past recreational alcohol and cannabis use, none recently. Denies any sick contacts. [facility].was told this is most likely GI virus, but provider was concerned about co-morbid COPD and sent patient here for laboratory evaluation (CBC and CMP) and Ct imaging (per documents patient brought with him).   Past Medical History  Diagnosis Date  . Hypertension   . Diabetes mellitus   . Pneumonia   . COPD (chronic obstructive pulmonary disease)     chronic Bronchhititis  . Complication of anesthesia   . Seizures     last one 3 years ago.  Md discontinued meds- Dr Anne Hahn  . 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   Past Surgical History  Procedure Laterality Date  . Tonsillectomy    . Hip surgery      rods to each hip- hip can ot of socket.  . Wrist surgery      repair due to crush  . Back surgery      5 from degenerative disease..    . Anterior cervical  decomp/discectomy fusion  12/20/2011    Procedure: ANTERIOR CERVICAL DECOMPRESSION/DISCECTOMY FUSION 2 LEVELS;  Surgeon: Mariam Dollar, MD;  Location: [facility];  Service: Neurosurgery;  Laterality: N/A;  Cervical four-five, five-six anterior cervical decompression/discectomy fusion with plating   History reviewed. No pertinent family history. History  Substance Use Topics  . Smoking status: Current Every Day Smoker -- 0.50 packs/day  . Smokeless tobacco: Not on file  . Alcohol Use: No     Comment: occassional     Review of Systems All other systems negative except as documented in the HPI. All pertinent positives and negatives as reviewed in the HPI.   Allergies  Darvocet and Onion  Home Medications   Prior to Admission medications   Medication Sig Start Date End Date Taking? Authorizing Provider  acetaminophen (TYLENOL) 500 MG tablet Take 1,000 mg by mouth every 6 (six) hours as needed for pain. For pain    Historical Provider, MD  albuterol (PROVENTIL HFA;VENTOLIN HFA) 108 (90 BASE) MCG/ACT inhaler Inhale 1-2 puffs into the lungs every 6 (six) hours as needed for wheezing or shortness of breath.    Historical Provider, MD  albuterol (PROVENTIL HFA;VENTOLIN HFA) 108 (90 BASE) MCG/ACT inhaler Inhale 1-2 puffs into the lungs every 4 (four) hours as needed for wheezing or shortness of breath. 03/02/14   Jennifer L Piepenbrink, PA-C  amLODipine (NORVASC) 10 MG tablet Take  10 mg by mouth daily.    Historical Provider, MD  atenolol (TENORMIN) 25 MG tablet Take 25 mg by mouth daily.    Historical Provider, MD  glimepiride (AMARYL) 2 MG tablet Take 2 mg by mouth daily before breakfast.    Historical Provider, MD  hydrochlorothiazide (HYDRODIURIL) 25 MG tablet Take 25 mg by mouth daily. 01/05/14   Historical Provider, MD  HYDROcodone-acetaminophen (NORCO) 10-325 MG per tablet Take 1 tablet by mouth every 4 (four) hours as needed. 02/26/14   Historical Provider, MD  HYDROcodone-homatropine  (HYCODAN) 5-1.5 MG/5ML syrup Take 5 mLs by mouth every 6 (six) hours as needed for cough. 03/02/14   Jennifer L Piepenbrink, PA-C  metFORMIN (GLUCOPHAGE) 850 MG tablet Take 850 mg by mouth 2 (two) times daily with a meal.    Historical Provider, MD  predniSONE (DELTASONE) 20 MG tablet Take 2 tablets (40 mg total) by mouth daily. 03/02/14   Lise Auer Piepenbrink, PA-C  Spacer/Aero-Holding Chambers (AEROCHAMBER PLUS FLO-VU LARGE) MISC 1 each by Other route once. 03/02/14   Jennifer L Piepenbrink, PA-C   BP 152/92  Pulse 60  Temp(Src) 99 F (37.2 C) (Oral)  Resp 20  SpO2 100% Physical Exam  Nursing note and vitals reviewed. Constitutional: He is oriented to person, place, and time. He appears well-developed and well-nourished. No distress.  HENT:  Mouth/Throat: Oropharynx is clear and moist.  Eyes: EOM are normal. Pupils are equal, round, and reactive to light.  Cardiovascular: Regular rhythm and normal heart sounds.  Bradycardia present.  Exam reveals no gallop and no friction rub.   No murmur heard. Pulses:      Radial pulses are 2+ on the right side, and 2+ on the left side.  Pulmonary/Chest: Effort normal and breath sounds normal.  Abdominal: Soft. Normal appearance. He exhibits no distension and no mass. Bowel sounds are increased. There is no hepatomegaly. There is tenderness in the epigastric area, left upper quadrant and left lower quadrant. There is guarding. There is no rigidity and no rebound.  Lymphadenopathy:    He has no cervical adenopathy.  Neurological: He is alert and oriented to person, place, and time.  Skin: Skin is warm and dry. He is not diaphoretic.    ED Course  Procedures (including critical care time) Labs Review Labs Reviewed  COMPREHENSIVE METABOLIC PANEL - Abnormal; Notable for the following:    Potassium 3.5 (*)    Glucose, Bld 100 (*)    All other components within normal limits  CBC WITH DIFFERENTIAL - Abnormal; Notable for the following:    Monocytes  Relative 13 (*)    All other components within normal limits  URINALYSIS, ROUTINE W REFLEX MICROSCOPIC - Abnormal; Notable for the following:    Color, Urine AMBER (*)    APPearance CLOUDY (*)    Specific Gravity, Urine 1.039 (*)    Bilirubin Urine SMALL (*)    Ketones, ur 15 (*)    Protein, ur 30 (*)    Leukocytes, UA SMALL (*)    All other components within normal limits  LIPASE, BLOOD  URINE MICROSCOPIC-ADD ON    Patient be referred back to his primary care Dr. told to return here as needed.  The patient has been stable here in the emergency department.  Imaging did not show any significant abnormality.  Patient will be given GI followup as well   Carlyle Dolly, PA-C 03/23/14 0133

## 2014-03-21 DIAGNOSIS — K2970 Gastritis, unspecified, without bleeding: Secondary | ICD-10-CM | POA: Diagnosis not present

## 2014-03-21 DIAGNOSIS — K2990 Gastroduodenitis, unspecified, without bleeding: Secondary | ICD-10-CM | POA: Diagnosis not present

## 2014-03-21 LAB — URINALYSIS, ROUTINE W REFLEX MICROSCOPIC
Glucose, UA: NEGATIVE mg/dL
Hgb urine dipstick: NEGATIVE
Ketones, ur: 15 mg/dL — AB
Nitrite: NEGATIVE
Protein, ur: 30 mg/dL — AB
Specific Gravity, Urine: 1.039 — ABNORMAL HIGH (ref 1.005–1.030)
Urobilinogen, UA: 1 mg/dL (ref 0.0–1.0)
pH: 5.5 (ref 5.0–8.0)

## 2014-03-21 LAB — URINE MICROSCOPIC-ADD ON

## 2014-03-21 MED ORDER — IOHEXOL 300 MG/ML  SOLN
100.0000 mL | Freq: Once | INTRAMUSCULAR | Status: AC | PRN
  Administered 2014-03-21: 100 mL via INTRAVENOUS

## 2014-03-21 MED ORDER — SUCRALFATE 1 G PO TABS: 1.0000 g | ORAL_TABLET | Freq: Three times a day (TID) | ORAL | Status: DC

## 2014-03-21 MED ORDER — PROMETHAZINE HCL 25 MG PO TABS: 25.0000 mg | ORAL_TABLET | Freq: Three times a day (TID) | ORAL | Status: DC | PRN

## 2014-03-21 MED ORDER — ACETAMINOPHEN 325 MG PO TABS
975.0000 mg | ORAL_TABLET | Freq: Once | ORAL | Status: AC
  Administered 2014-03-21: 975 mg via ORAL
  Filled 2014-03-21: qty 3

## 2014-03-21 MED ORDER — SUCRALFATE 1 G PO TABS
1.0000 g | ORAL_TABLET | Freq: Once | ORAL | Status: AC
  Administered 2014-03-21: 1 g via ORAL
  Filled 2014-03-21: qty 1

## 2014-03-21 MED ORDER — HYDROCODONE-ACETAMINOPHEN 5-325 MG PO TABS
1.0000 | ORAL_TABLET | Freq: Four times a day (QID) | ORAL | Status: DC | PRN
Start: 2014-03-21 — End: 2016-07-02

## 2014-03-21 MED ORDER — FAMOTIDINE 20 MG PO TABS: 20.0000 mg | ORAL_TABLET | Freq: Two times a day (BID) | ORAL | Status: DC

## 2014-03-21 NOTE — Discharge Instructions (Signed)
Return as needed. Follow up with your doctor.

## 2014-03-25 NOTE — ED Provider Notes (Signed)
Medical screening examination/treatment/procedure(s) were performed by non-physician practitioner and as supervising physician I was immediately available for consultation/collaboration.   EKG Interpretation None        Candyce Churn III, MD 03/25/14 [phone]

## 2014-09-25 ENCOUNTER — Other Ambulatory Visit: Payer: Self-pay | Admitting: Neurosurgery

## 2014-09-25 DIAGNOSIS — M5023 Other cervical disc displacement, cervicothoracic region: Secondary | ICD-10-CM

## 2014-10-08 ENCOUNTER — Ambulatory Visit
Admission: RE | Admit: 2014-10-08 | Discharge: 2014-10-08 | Disposition: A | Discharge status: Home / Self Care | Payer: Medicare Other | Source: Ambulatory Visit | Attending: Neurosurgery | Admitting: Neurosurgery

## 2014-10-08 DIAGNOSIS — M5023 Other cervical disc displacement, cervicothoracic region: Secondary | ICD-10-CM

## 2015-04-12 ENCOUNTER — Encounter (HOSPITAL_COMMUNITY): Payer: Self-pay | Admitting: Emergency Medicine

## 2015-04-12 ENCOUNTER — Emergency Department (INDEPENDENT_AMBULATORY_CARE_PROVIDER_SITE_OTHER)
Admission: EM | Admit: 2015-04-12 | Discharge: 2015-04-12 | Disposition: A | Discharge status: Home / Self Care | Payer: Medicare Other | Source: Home / Self Care | Attending: Family Medicine | Admitting: Family Medicine

## 2015-04-12 DIAGNOSIS — Z720 Tobacco use: Secondary | ICD-10-CM

## 2015-04-12 DIAGNOSIS — M549 Dorsalgia, unspecified: Secondary | ICD-10-CM

## 2015-04-12 DIAGNOSIS — J069 Acute upper respiratory infection, unspecified: Secondary | ICD-10-CM

## 2015-04-12 DIAGNOSIS — G8929 Other chronic pain: Secondary | ICD-10-CM

## 2015-04-12 DIAGNOSIS — J441 Chronic obstructive pulmonary disease with (acute) exacerbation: Secondary | ICD-10-CM

## 2015-04-12 MED ORDER — AZITHROMYCIN 250 MG PO TABS: 250.0000 mg | ORAL_TABLET | Freq: Every day | ORAL | Status: DC

## 2015-04-12 MED ORDER — TRIAMCINOLONE ACETONIDE 40 MG/ML IJ SUSP
INTRAMUSCULAR | Status: AC
  Filled 2015-04-12: qty 1

## 2015-04-12 MED ORDER — TRIAMCINOLONE ACETONIDE 40 MG/ML IJ SUSP
40.0000 mg | Freq: Once | INTRAMUSCULAR | Status: AC
  Administered 2015-04-12: 40 mg via INTRAMUSCULAR

## 2015-04-12 MED ORDER — IPRATROPIUM-ALBUTEROL 0.5-2.5 (3) MG/3ML IN SOLN
RESPIRATORY_TRACT | Status: AC
  Filled 2015-04-12: qty 3

## 2015-04-12 MED ORDER — ALBUTEROL SULFATE (2.5 MG/3ML) 0.083% IN NEBU
INHALATION_SOLUTION | RESPIRATORY_TRACT | Status: AC
  Filled 2015-04-12: qty 3

## 2015-04-12 MED ORDER — PREDNISONE 20 MG PO TABS: ORAL_TABLET | ORAL | Status: DC

## 2015-04-12 MED ORDER — IPRATROPIUM-ALBUTEROL 0.5-2.5 (3) MG/3ML IN SOLN
3.0000 mL | Freq: Once | RESPIRATORY_TRACT | Status: AC
  Administered 2015-04-12: 3 mL via RESPIRATORY_TRACT

## 2015-04-12 MED ORDER — ALBUTEROL SULFATE (2.5 MG/3ML) 0.083% IN NEBU
2.5000 mg | INHALATION_SOLUTION | Freq: Once | RESPIRATORY_TRACT | Status: AC
  Administered 2015-04-12: 2.5 mg via RESPIRATORY_TRACT

## 2015-04-12 NOTE — ED Provider Notes (Signed)
CSN: [id number]     Arrival date & time 04/12/15  1959 History   First MD Initiated Contact with Patient 04/12/15 2054     Chief Complaint  Patient presents with  . URI   (Consider location/radiation/quality/duration/timing/severity/associated sxs/prior Treatment) HPI Comments: 49 year old male with a history of chronic back pain and COPD, hypertension and diabetes presents with a complaint of persistent pain and out of his medications. He is also complaining of trouble breathing with his COPD and a URI. He is complaining of runny nose, head congestion, chest congestion, PND and cough. He has nebulizer treatment at home however he did not think he needed it at home. He decided to come to the urgent care to receive a nebulized treatment and treatment for his cold.   Past Medical History  Diagnosis Date  . Hypertension   . Diabetes mellitus   . Pneumonia   . COPD (chronic obstructive pulmonary disease) (HCC)     chronic Bronchhititis  . Complication of anesthesia   . Seizures (HCC)     last one 3 years ago.  Md discontinued meds- Dr Anne Hahn  . 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   Past Surgical History  Procedure Laterality Date  . Tonsillectomy    . Hip surgery      rods to each hip- hip can ot of socket.  . Wrist surgery      repair due to crush  . Back surgery      5 from degenerative disease..    . Anterior cervical decomp/discectomy fusion  12/20/2011    Procedure: ANTERIOR CERVICAL DECOMPRESSION/DISCECTOMY FUSION 2 LEVELS;  Surgeon: Mariam Dollar, MD;  Location: [facility];  Service: Neurosurgery;  Laterality: N/A;  Cervical four-five, five-six anterior cervical decompression/discectomy fusion with plating   No family history on file. Social History  Substance Use Topics  . Smoking status: Current Every Day Smoker -- 0.50 packs/day  . Smokeless tobacco: None  . Alcohol Use: No     Comment: occassional     Review of Systems  Constitutional: Negative.   Negative for fever.  HENT: Positive for congestion, postnasal drip and rhinorrhea. Negative for ear pain, facial swelling, sore throat and trouble swallowing.   Eyes: Negative for pain, discharge and redness.  Respiratory: Positive for cough and shortness of breath. Negative for chest tightness.   Cardiovascular: Negative.   Gastrointestinal: Negative.   Musculoskeletal: Negative.  Negative for neck pain and neck stiffness.  Skin: Negative.   Neurological: Negative.     Allergies  Darvocet and Onion  Home Medications   Prior to Admission medications   Medication Sig Start Date End Date Taking? Authorizing Provider  acetaminophen (TYLENOL) 500 MG tablet Take 1,000 mg by mouth every 6 (six) hours as needed for pain. For pain    Historical Provider, MD  albuterol (PROVENTIL HFA;VENTOLIN HFA) 108 (90 BASE) MCG/ACT inhaler Inhale 1-2 puffs into the lungs every 6 (six) hours as needed for wheezing or shortness of breath.    Historical Provider, MD  amLODipine (NORVASC) 10 MG tablet Take 10 mg by mouth daily.    Historical Provider, MD  atenolol (TENORMIN) 25 MG tablet Take 25 mg by mouth daily.    Historical Provider, MD  azithromycin (ZITHROMAX) 250 MG tablet Take 1 tablet (250 mg total) by mouth daily. 2 tabs po on day one, then one tablet po once daily on days 2-5. 04/12/15   Hayden Rasmussen, NP  famotidine (PEPCID) 20 MG  tablet Take 1 tablet (20 mg total) by mouth 2 (two) times daily. 03/21/14   Christopher Lawyer, PA-C  glimepiride (AMARYL) 2 MG tablet Take 2 mg by mouth daily before breakfast.    Historical Provider, MD  hydrochlorothiazide (HYDRODIURIL) 25 MG tablet Take 25 mg by mouth daily. 01/05/14   Historical Provider, MD  HYDROcodone-acetaminophen (NORCO) 10-325 MG per tablet Take 1 tablet by mouth every 4 (four) hours as needed. 02/26/14   Historical Provider, MD  HYDROcodone-acetaminophen (NORCO/VICODIN) 5-325 MG per tablet Take 1 tablet by mouth every 6 (six) hours as needed for moderate  pain. 03/21/14   Christopher Lawyer, PA-C  HYDROcodone-homatropine (HYCODAN) 5-1.5 MG/5ML syrup Take 5 mLs by mouth every 6 (six) hours as needed for cough. 03/02/14   Jennifer Piepenbrink, PA-C  metFORMIN (GLUCOPHAGE) 850 MG tablet Take 850 mg by mouth 2 (two) times daily with a meal.    Historical Provider, MD  predniSONE (DELTASONE) 20 MG tablet 3 Tabs PO Days 1-3, then 2 tabs PO Days 4-6, then 1 tab PO Day 7-9, then Half Tab PO Day 10-12 04/12/15   Hayden Rasmussen, NP  promethazine (PHENERGAN) 25 MG tablet Take 1 tablet (25 mg total) by mouth every 8 (eight) hours as needed for nausea or vomiting. 03/21/14   Charlestine Night, PA-C  sucralfate (CARAFATE) 1 G tablet Take 1 tablet (1 g total) by mouth 4 (four) times daily -  with meals and at bedtime. 03/21/14   Charlestine Night, PA-C   Meds Ordered and Administered this Visit   Medications  ipratropium-albuterol (DUONEB) 0.5-2.5 (3) MG/3ML nebulizer solution 3 mL (3 mLs Nebulization Given 04/12/15 2112)  albuterol (PROVENTIL) (2.5 MG/3ML) 0.083% nebulizer solution 2.5 mg (2.5 mg Nebulization Given 04/12/15 2112)  triamcinolone acetonide (KENALOG-40) injection 40 mg (40 mg Intramuscular Given 04/12/15 2112)    BP 126/53 mmHg  Pulse 99  Temp(Src) 98.9 F (37.2 C) (Oral)  Resp 16  SpO2 99% No data found.   Physical Exam  Constitutional: He appears well-developed and well-nourished. No distress.  HENT:  Mouth/Throat: No oropharyngeal exudate.  Bilateral TMs are normal Oropharynx with minor erythema and clear PND. Tongue is brown and discolored due to tobacco.  Neck: Normal range of motion. Neck supple.  Cardiovascular: Normal rate, regular rhythm and normal heart sounds.   Pulmonary/Chest: He has no rales.  Effort is increased but difficult to tell from baseline. There is wheezing and coarseness bilaterally. Prolonged expiratory phase.  Musculoskeletal: Normal range of motion. He exhibits no edema.  Lymphadenopathy:    He has no  cervical adenopathy.  Neurological: He is alert.  Skin: Skin is warm and dry.  Psychiatric: He has a normal mood and affect.  Nursing note and vitals reviewed.   ED Course  Procedures (including critical care time)  Labs Review Labs Reviewed - No data to display  Imaging Review No results found.   Visual Acuity Review  Right Eye Distance:   Left Eye Distance:   Bilateral Distance:    Right Eye Near:   Left Eye Near:    Bilateral Near:         MDM   1. COPD exacerbation (HCC)   2. Tobacco abuse disorder   3. URI (upper respiratory infection)   4. Chronic back pain    DuoNeb 5/2.5 mg 1. Kenalog 40 mg IM. Albuterol HFA one puff every 4-6 hours  for cough and wheeze Prednisone taper dose Allegra 180 mg or Zyrtec 10 mg daily for drainage Sudafed PE 10  mg every 4-6 hours as needed for congestion Robitussin-DM for cough and congestion Follow-up with your primary care doctor tomorrow or this week. Call for appointment   Hayden Rasmussen, NP 04/12/15 2125

## 2015-04-12 NOTE — Discharge Instructions (Signed)
Chronic Obstructive Pulmonary Disease Exacerbation Use or albuterol nebulizer at home every 6 hours as needed for cough and wheeze. Chronic obstructive pulmonary disease (COPD) is a common lung condition in which airflow from the lungs is limited. COPD is a general term that can be used to describe many different lung problems that limit airflow, including chronic bronchitis and emphysema. COPD exacerbations are episodes when breathing symptoms become much worse and require extra treatment. Without treatment, COPD exacerbations can be life threatening, and frequent COPD exacerbations can cause further damage to your lungs. CAUSES  Respiratory infections.  Exposure to smoke.  Exposure to air pollution, chemical fumes, or dust. Sometimes there is no apparent cause or trigger. RISK FACTORS  Smoking cigarettes.  Older age.  Frequent prior COPD exacerbations. SIGNS AND SYMPTOMS  Increased coughing.  Increased thick spit (sputum) production.  Increased wheezing.  Increased shortness of breath.  Rapid breathing.  Chest tightness. DIAGNOSIS Your medical history, a physical exam, and tests will help your health care provider make a diagnosis. Tests may include:  A chest X-ray.  Basic lab tests.  Sputum testing.  An arterial blood gas test. TREATMENT Depending on the severity of your COPD exacerbation, you may need to be admitted to a hospital for treatment. Some of the treatments commonly used to treat COPD exacerbations are:   Antibiotic medicines.  Bronchodilators. These are drugs that expand the air passages. They may be given with an inhaler or nebulizer. Spacer devices may be needed to help improve drug delivery.  Corticosteroid medicines.  Supplemental oxygen therapy.  Airway clearing techniques, such as noninvasive ventilation (NIV) and positive expiratory pressure (PEP). These provide respiratory support through a mask or other noninvasive device. HOME CARE  INSTRUCTIONS  Do not smoke. Quitting smoking is very important to prevent COPD from getting worse and exacerbations from happening as often.  Avoid exposure to all substances that irritate the airway, especially to tobacco smoke.  If you were prescribed an antibiotic medicine, finish it all even if you start to feel better.  Take all medicines as directed by your health care provider.It is important to use correct technique with inhaled medicines.  Drink enough fluids to keep your urine clear or pale yellow (unless you have a medical condition that requires fluid restriction).  Use a cool mist vaporizer. This makes it easier to clear your chest when you cough.  If you have a home nebulizer and oxygen, continue to use them as directed.  Maintain all necessary vaccinations to prevent infections.  Exercise regularly.  Eat a healthy diet.  Keep all follow-up appointments as directed by your health care provider. SEEK IMMEDIATE MEDICAL CARE IF:  You have worsening shortness of breath.  You have trouble talking.  You have severe chest pain.  You have blood in your sputum.  You have a fever.  You have weakness, vomit repeatedly, or faint.  You feel confused.  You continue to get worse. MAKE SURE YOU:  Understand these instructions.  Will watch your condition.  Will get help right away if you are not doing well or get worse.   This information is not intended to replace advice given to you by your health care provider. Make sure you discuss any questions you have with your health care provider.   Document Released: 04/16/2007 Document Revised: 07/10/2014 Document Reviewed: 02/21/2013 Elsevier Interactive Patient Education 2016 [employer]  Upper Respiratory Infection, Adult Allegra 180 mg or Zyrtec 10 mg daily for drainage Sudafed PE 10 mg every  4-6 hours as needed for congestion Robitussin-DM for cough and congestion Most upper respiratory infections (URIs) are  caused by a virus. A URI affects the nose, throat, and upper air passages. The most common type of URI is often called "the common cold." HOME CARE   Take medicines only as told by your doctor.  Gargle warm saltwater or take cough drops to comfort your throat as told by your doctor.  Use a warm mist humidifier or inhale steam from a shower to increase air moisture. This may make it easier to breathe.  Drink enough fluid to keep your pee (urine) clear or pale yellow.  Eat soups and other clear broths.  Have a healthy diet.  Rest as needed.  Go back to work when your fever is gone or your doctor says it is okay.  You may need to stay home longer to avoid giving your URI to others.  You can also wear a face mask and wash your hands often to prevent spread of the virus.  Use your inhaler more if you have asthma.  Do not use any tobacco products, including cigarettes, chewing tobacco, or electronic cigarettes. If you need help quitting, ask your doctor. GET HELP IF:  You are getting worse, not better.  Your symptoms are not helped by medicine.  You have chills.  You are getting more short of breath.  You have brown or red mucus.  You have yellow or brown discharge from your nose.  You have pain in your face, especially when you bend forward.  You have a fever.  You have puffy (swollen) neck glands.  You have pain while swallowing.  You have white areas in the back of your throat. GET HELP RIGHT AWAY IF:   You have very bad or constant:  Headache.  Ear pain.  Pain in your forehead, behind your eyes, and over your cheekbones (sinus pain).  Chest pain.  You have long-lasting (chronic) lung disease and any of the following:  Wheezing.  Long-lasting cough.  Coughing up blood.  A change in your usual mucus.  You have a stiff neck.  You have changes in your:  Vision.  Hearing.  Thinking.  Mood. MAKE SURE YOU:   Understand these  instructions.  Will watch your condition.  Will get help right away if you are not doing well or get worse.   This information is not intended to replace advice given to you by your health care provider. Make sure you discuss any questions you have with your health care provider.   Document Released: 12/06/2007 Document Revised: 11/03/2014 Document Reviewed: 09/24/2013 Elsevier Interactive Patient Education [employer]

## 2015-07-19 MED FILL — SUBOXONE 4 MG-1 MG SL FILM: 4-1 | 30 days supply | Qty: 60 | Fill #0

## 2015-08-12 ENCOUNTER — Emergency Department (INDEPENDENT_AMBULATORY_CARE_PROVIDER_SITE_OTHER)
Admission: EM | Admit: 2015-08-12 | Discharge: 2015-08-12 | Disposition: A | Discharge status: Home / Self Care | Payer: Medicare Other | Source: Home / Self Care | Attending: Family Medicine | Admitting: Family Medicine

## 2015-08-12 ENCOUNTER — Encounter (HOSPITAL_COMMUNITY): Payer: Self-pay

## 2015-08-12 ENCOUNTER — Emergency Department (INDEPENDENT_AMBULATORY_CARE_PROVIDER_SITE_OTHER): Payer: Medicare Other

## 2015-08-12 DIAGNOSIS — J441 Chronic obstructive pulmonary disease with (acute) exacerbation: Secondary | ICD-10-CM

## 2015-08-12 MED ORDER — PREDNISONE 20 MG PO TABS: ORAL_TABLET | ORAL | Status: DC

## 2015-08-12 MED ORDER — DOXYCYCLINE HYCLATE 100 MG PO CAPS: 100.0000 mg | ORAL_CAPSULE | Freq: Two times a day (BID) | ORAL | Status: DC

## 2015-08-12 NOTE — Discharge Instructions (Signed)
It was a pleasure to see you today.  The chest x-ray does not show evidence of pneumonia.  This is good news.   For the COPD exacerbation, I am prescribing the following:  1. PREDNISONE  tablets, take 2 tablets by mouth one time daily for 5 days.  2. DOXYCYCLINE (antibiotic)  capsules, take 1 capsule by mouth two times daily for 7 days.   Please schedule follow up with your primary doctor in the coming 3 to 5 days.   Return to Urgent Care or Emergency Department if you have worsening shortness of breath, or other concerns.   You are advised to receive the flu shot every year during flu season.  It is not too late to get the shot this season!

## 2015-08-12 NOTE — ED Notes (Signed)
Pt complaining of productive cough,chest pain, back pain and congestion due to productive cough Pt alert and oriented

## 2015-08-12 NOTE — ED Provider Notes (Addendum)
CSN: [id number]     Arrival date & time 08/12/15  1311 History   First MD Initiated Contact with Patient 08/12/15 1502     Chief Complaint  Patient presents with  . Cough   (Consider location/radiation/quality/duration/timing/severity/associated sxs/prior Treatment) Patient is a 50 y.o. male presenting with cough. The history is provided by the patient. No language interpreter was used.  Cough Cough characteristics:  Productive Associated symptoms: diaphoresis and shortness of breath   Associated symptoms: no chest pain, no chills, no fever and no wheezing   Patient with known COPD and most recent exacerbation 1 month ago, presents with 3 days of worsening dyspnea when laying supine, as well as drenching sweats at nighttime.  Has been using albuterol nebs at home with some relief, until last night when his neb did not help.  Denies fevers at home.  Reports similar episode about 1 month ago which responded to prednisone burst.   At this time reports increased cough with non-bloody sputum production that has worsened in the past 3 days.   Social Hx; Smokes 3 cigarettes a day (previously 2 ppd).   Past Medical History  Diagnosis Date  . Hypertension   . Diabetes mellitus   . Pneumonia   . COPD (chronic obstructive pulmonary disease) (HCC)     chronic Bronchhititis  . Complication of anesthesia   . Seizures (HCC)     last one 3 years ago.  Md discontinued meds- Dr Anne Hahn  . 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   Past Surgical History  Procedure Laterality Date  . Tonsillectomy    . Hip surgery      rods to each hip- hip can ot of socket.  . Wrist surgery      repair due to crush  . Back surgery      5 from degenerative disease..    . Anterior cervical decomp/discectomy fusion  12/20/2011    Procedure: ANTERIOR CERVICAL DECOMPRESSION/DISCECTOMY FUSION 2 LEVELS;  Surgeon: Mariam Dollar, MD;  Location: [facility];  Service: Neurosurgery;  Laterality: N/A;  Cervical  four-five, five-six anterior cervical decompression/discectomy fusion with plating   History reviewed. No pertinent family history. Social History  Substance Use Topics  . Smoking status: Current Every Day Smoker -- 0.50 packs/day  . Smokeless tobacco: None  . Alcohol Use: No     Comment: occassional     Review of Systems  Constitutional: Positive for diaphoresis. Negative for fever, chills, activity change and appetite change.  Respiratory: Positive for cough and shortness of breath. Negative for chest tightness and wheezing.   Cardiovascular: Negative for chest pain and palpitations.  Gastrointestinal: Negative for nausea and vomiting.  All other systems reviewed and are negative.   Allergies  Darvocet and Onion  Home Medications   Prior to Admission medications   Medication Sig Start Date End Date Taking? Authorizing Provider  acetaminophen (TYLENOL) 500 MG tablet Take 1,000 mg by mouth every 6 (six) hours as needed for pain. For pain    Historical Provider, MD  albuterol (PROVENTIL HFA;VENTOLIN HFA) 108 (90 BASE) MCG/ACT inhaler Inhale 1-2 puffs into the lungs every 6 (six) hours as needed for wheezing or shortness of breath.    Historical Provider, MD  amLODipine (NORVASC) 10 MG tablet Take 10 mg by mouth daily.    Historical Provider, MD  atenolol (TENORMIN) 25 MG tablet Take 25 mg by mouth daily.    Historical Provider, MD  azithromycin (ZITHROMAX) 250 MG  tablet Take 1 tablet (250 mg total) by mouth daily. 2 tabs po on day one, then one tablet po once daily on days 2-5. 04/12/15   Hayden Rasmussen, NP  famotidine (PEPCID) 20 MG tablet Take 1 tablet (20 mg total) by mouth 2 (two) times daily. 03/21/14   Christopher Lawyer, PA-C  glimepiride (AMARYL) 2 MG tablet Take 2 mg by mouth daily before breakfast.    Historical Provider, MD  hydrochlorothiazide (HYDRODIURIL) 25 MG tablet Take 25 mg by mouth daily. 01/05/14   Historical Provider, MD  HYDROcodone-acetaminophen (NORCO) 10-325 MG per  tablet Take 1 tablet by mouth every 4 (four) hours as needed. 02/26/14   Historical Provider, MD  HYDROcodone-acetaminophen (NORCO/VICODIN) 5-325 MG per tablet Take 1 tablet by mouth every 6 (six) hours as needed for moderate pain. 03/21/14   Christopher Lawyer, PA-C  HYDROcodone-homatropine (HYCODAN) 5-1.5 MG/5ML syrup Take 5 mLs by mouth every 6 (six) hours as needed for cough. 03/02/14   Jennifer Piepenbrink, PA-C  metFORMIN (GLUCOPHAGE) 850 MG tablet Take 850 mg by mouth 2 (two) times daily with a meal.    Historical Provider, MD  predniSONE (DELTASONE) 20 MG tablet 3 Tabs PO Days 1-3, then 2 tabs PO Days 4-6, then 1 tab PO Day 7-9, then Half Tab PO Day 10-12 04/12/15   Hayden Rasmussen, NP  promethazine (PHENERGAN) 25 MG tablet Take 1 tablet (25 mg total) by mouth every 8 (eight) hours as needed for nausea or vomiting. 03/21/14   Charlestine Night, PA-C  sucralfate (CARAFATE) 1 G tablet Take 1 tablet (1 g total) by mouth 4 (four) times daily -  with meals and at bedtime. 03/21/14   Charlestine Night, PA-C   Meds Ordered and Administered this Visit  Medications - No data to display  BP 154/98 mmHg  Pulse 56  Temp(Src) 98.7 F (37.1 C) (Oral)  Resp 16  SpO2 100% No data found.   Physical Exam  Constitutional: He appears well-developed and well-nourished. No distress.  Speaking in complete sentences fluidly. No distress  HENT:  Head: Normocephalic and atraumatic.  Right Ear: External ear normal.  Left Ear: External ear normal.  Nose: Nose normal.  Mouth/Throat: No oropharyngeal exudate.  Neck: Normal range of motion. Neck supple.  Shotty anterior cervical adenopathy  Cardiovascular: Normal rate and regular rhythm.   Pulmonary/Chest: Effort normal. No respiratory distress. He has no wheezes. He has no rales. He exhibits no tenderness.  No focal rales on exam. Generalized rhonchi heard throughout lung fields bilaterally    ED Course  Procedures (including critical care time)  Labs  Review Labs Reviewed - No data to display  Imaging Review No results found.   Visual Acuity Review  Right Eye Distance:   Left Eye Distance:   Bilateral Distance:    Right Eye Near:   Left Eye Near:    Bilateral Near:         MDM   1. COPD with acute exacerbation (HCC)    Patient with known COPD, stable vital signs (afebrile, POx 100% on room air) for c/o cough with increased sputum, dyspnea especially at night.  CXR today reviewed by me, no infiltrates or findings of lobar pneumonia. Treatment as COPD exacerbation with increased sputum production. Doxy and prednisone burst. PCP for follow up. Advised for flu shot, patient does not get regularly. Advised to quit smoking.     Barbaraann Barthel, MD 08/12/15 1518  Barbaraann Barthel, MD 08/12/15 1610  Barbaraann Barthel, MD 08/12/15  1548 

## 2015-08-19 MED FILL — SUBOXONE 4 MG-1 MG SL FILM: 4-1 | 30 days supply | Qty: 60 | Fill #0

## 2015-09-08 ENCOUNTER — Encounter (HOSPITAL_COMMUNITY): Payer: Self-pay | Admitting: Emergency Medicine

## 2015-09-08 ENCOUNTER — Emergency Department (INDEPENDENT_AMBULATORY_CARE_PROVIDER_SITE_OTHER)
Admission: EM | Admit: 2015-09-08 | Discharge: 2015-09-08 | Disposition: A | Discharge status: Home / Self Care | Payer: Medicare Other | Source: Home / Self Care | Attending: Family Medicine | Admitting: Family Medicine

## 2015-09-08 DIAGNOSIS — J441 Chronic obstructive pulmonary disease with (acute) exacerbation: Secondary | ICD-10-CM | POA: Diagnosis not present

## 2015-09-08 DIAGNOSIS — J069 Acute upper respiratory infection, unspecified: Secondary | ICD-10-CM

## 2015-09-08 MED ORDER — TRIAMCINOLONE ACETONIDE 40 MG/ML IJ SUSP
40.0000 mg | Freq: Once | INTRAMUSCULAR | Status: AC
  Administered 2015-09-08: 40 mg via INTRAMUSCULAR

## 2015-09-08 MED ORDER — IPRATROPIUM-ALBUTEROL 0.5-2.5 (3) MG/3ML IN SOLN
3.0000 mL | Freq: Once | RESPIRATORY_TRACT | Status: AC
  Administered 2015-09-08: 3 mL via RESPIRATORY_TRACT

## 2015-09-08 MED ORDER — PREDNISONE 20 MG PO TABS: ORAL_TABLET | ORAL | Status: DC

## 2015-09-08 MED ORDER — ALBUTEROL SULFATE HFA 108 (90 BASE) MCG/ACT IN AERS: 2.0000 | INHALATION_SPRAY | RESPIRATORY_TRACT | Status: DC | PRN

## 2015-09-08 MED ORDER — IPRATROPIUM-ALBUTEROL 0.5-2.5 (3) MG/3ML IN SOLN
RESPIRATORY_TRACT | Status: AC
  Filled 2015-09-08: qty 3

## 2015-09-08 MED ORDER — ALBUTEROL SULFATE (2.5 MG/3ML) 0.083% IN NEBU
INHALATION_SOLUTION | RESPIRATORY_TRACT | Status: AC
  Filled 2015-09-08: qty 3

## 2015-09-08 MED ORDER — ALBUTEROL SULFATE (2.5 MG/3ML) 0.083% IN NEBU
2.5000 mg | INHALATION_SOLUTION | Freq: Once | RESPIRATORY_TRACT | Status: AC
  Administered 2015-09-08: 2.5 mg via RESPIRATORY_TRACT

## 2015-09-08 MED ORDER — TRIAMCINOLONE ACETONIDE 40 MG/ML IJ SUSP
INTRAMUSCULAR | Status: AC
  Filled 2015-09-08: qty 1

## 2015-09-08 MED ORDER — AZITHROMYCIN 250 MG PO TABS: 250.0000 mg | ORAL_TABLET | Freq: Once | ORAL | Status: DC

## 2015-09-08 NOTE — ED Notes (Signed)
Body aches, cough, pain in chest and lower back.  Symptoms started 3 days ago

## 2015-09-08 NOTE — ED Provider Notes (Signed)
CSN: [id number]     Arrival date & time 09/08/15  1324 History   First MD Initiated Contact with Patient 09/08/15 1508     Chief Complaint  Patient presents with  . Generalized Body Aches   (Consider location/radiation/quality/duration/timing/severity/associated sxs/prior Treatment) HPI Comments: 50 year old male with COPD and who continues to smoke has a 3 day history of increased cough, yellow phlegm, PND, nasal stuffiness, runny nose and wheezing. He is using his albuterol HFA twice a day. He used his albuterol nebulizer yesterday. Denies fever.   Past Medical History  Diagnosis Date  . Hypertension   . Diabetes mellitus   . Pneumonia   . COPD (chronic obstructive pulmonary disease) (HCC)     chronic Bronchhititis  . Complication of anesthesia   . Seizures (HCC)     last one 3 years ago.  Md discontinued meds- Dr Anne Hahn  . 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   Past Surgical History  Procedure Laterality Date  . Tonsillectomy    . Hip surgery      rods to each hip- hip can ot of socket.  . Wrist surgery      repair due to crush  . Back surgery      5 from degenerative disease..    . Anterior cervical decomp/discectomy fusion  12/20/2011    Procedure: ANTERIOR CERVICAL DECOMPRESSION/DISCECTOMY FUSION 2 LEVELS;  Surgeon: Mariam Dollar, MD;  Location: [facility];  Service: Neurosurgery;  Laterality: N/A;  Cervical four-five, five-six anterior cervical decompression/discectomy fusion with plating   No family history on file. Social History  Substance Use Topics  . Smoking status: Current Every Day Smoker -- 0.50 packs/day  . Smokeless tobacco: None  . Alcohol Use: No     Comment: occassional     Review of Systems  Constitutional: Positive for activity change. Negative for fever, diaphoresis and fatigue.  HENT: Positive for congestion, postnasal drip and rhinorrhea. Negative for ear pain, facial swelling, sore throat and trouble swallowing.   Eyes: Negative  for pain, discharge and redness.  Respiratory: Positive for cough and chest tightness. Negative for shortness of breath.   Cardiovascular: Positive for chest pain. Negative for palpitations.       Chest and rib pain associated with coughing only.  Gastrointestinal: Negative.   Musculoskeletal: Negative.  Negative for neck pain and neck stiffness.  Skin: Negative.   Neurological: Negative.   All other systems reviewed and are negative.   Allergies  Darvocet and Onion  Home Medications   Prior to Admission medications   Medication Sig Start Date End Date Taking? Authorizing Provider  acetaminophen (TYLENOL) 500 MG tablet Take 1,000 mg by mouth every 6 (six) hours as needed for pain. For pain    Historical Provider, MD  albuterol (PROVENTIL HFA;VENTOLIN HFA) 108 (90 Base) MCG/ACT inhaler Inhale 2 puffs into the lungs every 4 (four) hours as needed for wheezing or shortness of breath. 09/08/15   Hayden Rasmussen, NP  amLODipine (NORVASC) 10 MG tablet Take 10 mg by mouth daily.    Historical Provider, MD  atenolol (TENORMIN) 25 MG tablet Take 25 mg by mouth daily.    Historical Provider, MD  azithromycin (ZITHROMAX) 250 MG tablet Take 1 tablet (250 mg total) by mouth once. 2 tabs po on day one, then one tablet po once daily on days 2-5. 09/08/15   Hayden Rasmussen, NP  doxycycline (VIBRAMYCIN) 100 MG capsule Take 1 capsule (100 mg total) by mouth 2 (  two) times daily. 08/12/15   Barbaraann BarthelJames O Breen, MD  famotidine (PEPCID) 20 MG tablet Take 1 tablet (20 mg total) by mouth 2 (two) times daily. 03/21/14   Christopher Lawyer, PA-C  glimepiride (AMARYL) 2 MG tablet Take 2 mg by mouth daily before breakfast.    Historical Provider, MD  hydrochlorothiazide (HYDRODIURIL) 25 MG tablet Take 25 mg by mouth daily. 01/05/14   Historical Provider, MD  HYDROcodone-acetaminophen (NORCO) 10-325 MG per tablet Take 1 tablet by mouth every 4 (four) hours as needed. 02/26/14   Historical Provider, MD  HYDROcodone-acetaminophen  (NORCO/VICODIN) 5-325 MG per tablet Take 1 tablet by mouth every 6 (six) hours as needed for moderate pain. 03/21/14   Christopher Lawyer, PA-C  HYDROcodone-homatropine (HYCODAN) 5-1.5 MG/5ML syrup Take 5 mLs by mouth every 6 (six) hours as needed for cough. 03/02/14   Jennifer Piepenbrink, PA-C  metFORMIN (GLUCOPHAGE) 850 MG tablet Take 850 mg by mouth 2 (two) times daily with a meal.    Historical Provider, MD  predniSONE (DELTASONE) 20 MG tablet 3 Tabs PO Days 1-3, then 2 tabs PO Days 4-6, then 1 tab PO Day 7-9, then Half Tab PO Day 10-12 09/08/15   Hayden Rasmussenavid Dorothee Napierkowski, NP  promethazine (PHENERGAN) 25 MG tablet Take 1 tablet (25 mg total) by mouth every 8 (eight) hours as needed for nausea or vomiting. 03/21/14   Charlestine Nighthristopher Lawyer, PA-C  sucralfate (CARAFATE) 1 G tablet Take 1 tablet (1 g total) by mouth 4 (four) times daily -  with meals and at bedtime. 03/21/14   Charlestine Nighthristopher Lawyer, PA-C   Meds Ordered and Administered this Visit   Medications  ipratropium-albuterol (DUONEB) 0.5-2.5 (3) MG/3ML nebulizer solution 3 mL (3 mLs Nebulization Given 09/08/15 1533)  albuterol (PROVENTIL) (2.5 MG/3ML) 0.083% nebulizer solution 2.5 mg (2.5 mg Nebulization Given 09/08/15 1533)  triamcinolone acetonide (KENALOG-40) injection 40 mg (40 mg Intramuscular Given 09/08/15 1532)    There were no vitals taken for this visit. No data found.   Physical Exam  Constitutional: He is oriented to person, place, and time. He appears well-developed and well-nourished. No distress.  HENT:  Mouth/Throat: No oropharyngeal exudate.  Bilateral TMs with retraction. Oropharynx with moderate clear PND. Tongue is covered with brown and black charcoal discoloration due to smoking.  Eyes: Conjunctivae and EOM are normal.  Neck: Normal range of motion. Neck supple.  Cardiovascular: Normal rate, regular rhythm and normal heart sounds.   Pulmonary/Chest: Effort normal. No respiratory distress. He has wheezes. He exhibits tenderness.  Bilateral  expiratory wheezing. When patient taking a deep breath this produces coughing spasms. No crackles.  Musculoskeletal: Normal range of motion. He exhibits no edema.  Lymphadenopathy:    He has no cervical adenopathy.  Neurological: He is alert and oriented to person, place, and time.  Skin: Skin is warm and dry. No rash noted.  Psychiatric: He has a normal mood and affect.  Nursing note and vitals reviewed.   ED Course  Procedures (including critical care time)  Labs Review Labs Reviewed - No data to display  Imaging Review No results found.   Visual Acuity Review  Right Eye Distance:   Left Eye Distance:   Bilateral Distance:    Right Eye Near:   Left Eye Near:    Bilateral Near:         MDM   1. COPD exacerbation (HCC)   2. URI (upper respiratory infection)     Past 2.5/5 mg patient states he is breathing better and having less  cough. Auscultation reveals improved air movement. There continues to be some coarseness and wheezing. He is afebrile. He has upper respiratory symptoms and PND. This is exacerbating his symptoms. He does not have an albuterol HFA. He is only using his nebulizer maybe twice a day. He is instructed to use that more often and will receive a prescription for albuterol HFA. Prednisone taper dose as directed. Meds ordered this encounter  Medications  . ipratropium-albuterol (DUONEB) 0.5-2.5 (3) MG/3ML nebulizer solution 3 mL    Sig:   . albuterol (PROVENTIL) (2.5 MG/3ML) 0.083% nebulizer solution 2.5 mg    Sig:   . triamcinolone acetonide (KENALOG-40) injection 40 mg    Sig:   . albuterol (PROVENTIL HFA;VENTOLIN HFA) 108 (90 Base) MCG/ACT inhaler    Sig: Inhale 2 puffs into the lungs every 4 (four) hours as needed for wheezing or shortness of breath.    Dispense:  1 Inhaler    Refill:  0    Order Specific Question:  Supervising Provider    Answer:  Bradd Canary D [id number]  . predniSONE (DELTASONE) 20 MG tablet    Sig: 3 Tabs PO Days 1-3,  then 2 tabs PO Days 4-6, then 1 tab PO Day 7-9, then Half Tab PO Day 10-12    Dispense:  20 tablet    Refill:  0    Order Specific Question:  Supervising Provider    Answer:  Linna Hoff [phone]  . azithromycin (ZITHROMAX) 250 MG tablet    Sig: Take 1 tablet (250 mg total) by mouth once. 2 tabs po on day one, then one tablet po once daily on days 2-5.    Dispense:  6 tablet    Refill:  0    Order Specific Question:  Supervising Provider    Answer:  Linna Hoff [[id number]]       Hayden Rasmussen, NP 09/08/15 1616

## 2015-09-08 NOTE — Discharge Instructions (Signed)
Chronic Obstructive Pulmonary Disease Exacerbation Chronic obstructive pulmonary disease (COPD) is a common lung condition in which airflow from the lungs is limited. COPD is a general term that can be used to describe many different lung problems that limit airflow, including chronic bronchitis and emphysema. COPD exacerbations are episodes when breathing symptoms become much worse and require extra treatment. Without treatment, COPD exacerbations can be life threatening, and frequent COPD exacerbations can cause further damage to your lungs. CAUSES  Respiratory infections.  Exposure to smoke.  Exposure to air pollution, chemical fumes, or dust. Sometimes there is no apparent cause or trigger. RISK FACTORS  Smoking cigarettes.  Older age.  Frequent prior COPD exacerbations. SIGNS AND SYMPTOMS  Increased coughing.  Increased thick spit (sputum) production.  Increased wheezing.  Increased shortness of breath.  Rapid breathing.  Chest tightness. DIAGNOSIS Your medical history, a physical exam, and tests will help your health care provider make a diagnosis. Tests may include:  A chest X-ray.  Basic lab tests.  Sputum testing.  An arterial blood gas test. TREATMENT Depending on the severity of your COPD exacerbation, you may need to be admitted to a hospital for treatment. Some of the treatments commonly used to treat COPD exacerbations are:   Antibiotic medicines.  Bronchodilators. These are drugs that expand the air passages. They may be given with an inhaler or nebulizer. Spacer devices may be needed to help improve drug delivery.  Corticosteroid medicines.  Supplemental oxygen therapy.  Airway clearing techniques, such as noninvasive ventilation (NIV) and positive expiratory pressure (PEP). These provide respiratory support through a mask or other noninvasive device. HOME CARE INSTRUCTIONS  Do not smoke. Quitting smoking is very important to prevent COPD from  getting worse and exacerbations from happening as often.  Avoid exposure to all substances that irritate the airway, especially to tobacco smoke.  If you were prescribed an antibiotic medicine, finish it all even if you start to feel better.  Take all medicines as directed by your health care provider.It is important to use correct technique with inhaled medicines.  Drink enough fluids to keep your urine clear or pale yellow (unless you have a medical condition that requires fluid restriction).  Use a cool mist vaporizer. This makes it easier to clear your chest when you cough.  If you have a home nebulizer and oxygen, continue to use them as directed.  Maintain all necessary vaccinations to prevent infections.  Exercise regularly.  Eat a healthy diet.  Keep all follow-up appointments as directed by your health care provider. SEEK IMMEDIATE MEDICAL CARE IF:  You have worsening shortness of breath.  You have trouble talking.  You have severe chest pain.  You have blood in your sputum.  You have a fever.  You have weakness, vomit repeatedly, or faint.  You feel confused.  You continue to get worse. MAKE SURE YOU:  Understand these instructions.  Will watch your condition.  Will get help right away if you are not doing well or get worse.   This information is not intended to replace advice given to you by your health care provider. Make sure you discuss any questions you have with your health care provider.   Document Released: 04/16/2007 Document Revised: 07/10/2014 Document Reviewed: 02/21/2013 Elsevier Interactive Patient Education 2016 [employer]  How to Use an Inhaler Using your inhaler correctly is very important. Good technique will make sure that the medicine reaches your lungs.  HOW TO USE AN INHALER:  Take the cap off  the inhaler.  If this is the first time using your inhaler, you need to prime it. Shake the inhaler for 5 seconds. Release four  puffs into the air, away from your face. Ask your doctor for help if you have questions.  Shake the inhaler for 5 seconds.  Turn the inhaler so the bottle is above the mouthpiece.  Put your pointer finger on top of the bottle. Your thumb holds the bottom of the inhaler.  Open your mouth.  Either hold the inhaler away from your mouth (the width of 2 fingers) or place your lips tightly around the mouthpiece. Ask your doctor which way to use your inhaler.  Breathe out as much air as possible.  Breathe in and push down on the bottle 1 time to release the medicine. You will feel the medicine go in your mouth and throat.  Continue to take a deep breath in very slowly. Try to fill your lungs.  After you have breathed in completely, hold your breath for 10 seconds. This will help the medicine to settle in your lungs. If you cannot hold your breath for 10 seconds, hold it for as long as you can before you breathe out.  Breathe out slowly, through pursed lips. Whistling is an example of pursed lips.  If your doctor has told you to take more than 1 puff, wait at least 15-30 seconds between puffs. This will help you get the best results from your medicine. Do not use the inhaler more than your doctor tells you to.  Put the cap back on the inhaler.  Follow the directions from your doctor or from the inhaler package about cleaning the inhaler. If you use more than one inhaler, ask your doctor which inhalers to use and what order to use them in. Ask your doctor to help you figure out when you will need to refill your inhaler.  If you use a steroid inhaler, always rinse your mouth with water after your last puff, gargle and spit out the water. Do not swallow the water. GET HELP IF:  The inhaler medicine only partially helps to stop wheezing or shortness of breath.  You are having trouble using your inhaler.  You have some increase in thick spit (phlegm). GET HELP RIGHT AWAY IF:  The inhaler  medicine does not help your wheezing or shortness of breath or you have tightness in your chest.  You have dizziness, headaches, or fast heart rate.  You have chills, fever, or night sweats.  You have a large increase of thick spit, or your thick spit is bloody. MAKE SURE YOU:   Understand these instructions.  Will watch your condition.  Will get help right away if you are not doing well or get worse.   This information is not intended to replace advice given to you by your health care provider. Make sure you discuss any questions you have with your health care provider.   Document Released: 03/28/2008 Document Revised: 04/09/2013 Document Reviewed: 01/16/2013 Elsevier Interactive Patient Education 2016 [employer] Inc[employer]  Upper Respiratory Infection, Adult For nasal and head congestion may take Sudafed PE 10 mg every 4 hours as needed. Saline nasal spray used frequently. For drainage may use Allegra, Claritin or Zyrtec. If you need stronger medicine to stop drainage may take Chlor-Trimeton 2-4 mg every 4 hours. This may cause drowsiness. Ibuprofen 600 mg every 6 hours as needed for pain, discomfort or fever. Drink plenty of fluids and stay well-hydrated.  Most upper respiratory  infections (URIs) are caused by a virus. A URI affects the nose, throat, and upper air passages. The most common type of URI is often called "the common cold." HOME CARE   Take medicines only as told by your doctor.  Gargle warm saltwater or take cough drops to comfort your throat as told by your doctor.  Use a warm mist humidifier or inhale steam from a shower to increase air moisture. This may make it easier to breathe.  Drink enough fluid to keep your pee (urine) clear or pale yellow.  Eat soups and other clear broths.  Have a healthy diet.  Rest as needed.  Go back to work when your fever is gone or your doctor says it is okay.  You may need to stay home longer to avoid giving your URI to  others.  You can also wear a face mask and wash your hands often to prevent spread of the virus.  Use your inhaler more if you have asthma.  Do not use any tobacco products, including cigarettes, chewing tobacco, or electronic cigarettes. If you need help quitting, ask your doctor. GET HELP IF:  You are getting worse, not better.  Your symptoms are not helped by medicine.  You have chills.  You are getting more short of breath.  You have brown or red mucus.  You have yellow or brown discharge from your nose.  You have pain in your face, especially when you bend forward.  You have a fever.  You have puffy (swollen) neck glands.  You have pain while swallowing.  You have white areas in the back of your throat. GET HELP RIGHT AWAY IF:   You have very bad or constant:  Headache.  Ear pain.  Pain in your forehead, behind your eyes, and over your cheekbones (sinus pain).  Chest pain.  You have long-lasting (chronic) lung disease and any of the following:  Wheezing.  Long-lasting cough.  Coughing up blood.  A change in your usual mucus.  You have a stiff neck.  You have changes in your:  Vision.  Hearing.  Thinking.  Mood. MAKE SURE YOU:   Understand these instructions.  Will watch your condition.  Will get help right away if you are not doing well or get worse.   This information is not intended to replace advice given to you by your health care provider. Make sure you discuss any questions you have with your health care provider.   Document Released: 12/06/2007 Document Revised: 11/03/2014 Document Reviewed: 09/24/2013 Elsevier Interactive Patient Education [employer]

## 2015-09-16 MED FILL — SUBOXONE 4 MG-1 MG SL FILM: 4-1 | 30 days supply | Qty: 60 | Fill #0

## 2015-10-09 ENCOUNTER — Emergency Department (HOSPITAL_COMMUNITY)
Admission: EM | Admit: 2015-10-09 | Discharge: 2015-10-09 | Disposition: A | Discharge status: Home / Self Care | Payer: Medicare Other | Attending: Emergency Medicine | Admitting: Emergency Medicine

## 2015-10-09 ENCOUNTER — Encounter (HOSPITAL_COMMUNITY): Payer: Self-pay

## 2015-10-09 DIAGNOSIS — F17200 Nicotine dependence, unspecified, uncomplicated: Secondary | ICD-10-CM | POA: Insufficient documentation

## 2015-10-09 DIAGNOSIS — Z79899 Other long term (current) drug therapy: Secondary | ICD-10-CM | POA: Insufficient documentation

## 2015-10-09 DIAGNOSIS — I10 Essential (primary) hypertension: Secondary | ICD-10-CM | POA: Diagnosis not present

## 2015-10-09 DIAGNOSIS — Z7984 Long term (current) use of oral hypoglycemic drugs: Secondary | ICD-10-CM | POA: Diagnosis not present

## 2015-10-09 DIAGNOSIS — J449 Chronic obstructive pulmonary disease, unspecified: Secondary | ICD-10-CM | POA: Insufficient documentation

## 2015-10-09 DIAGNOSIS — E119 Type 2 diabetes mellitus without complications: Secondary | ICD-10-CM | POA: Insufficient documentation

## 2015-10-09 DIAGNOSIS — K2970 Gastritis, unspecified, without bleeding: Secondary | ICD-10-CM | POA: Diagnosis not present

## 2015-10-09 DIAGNOSIS — Z8701 Personal history of pneumonia (recurrent): Secondary | ICD-10-CM | POA: Diagnosis not present

## 2015-10-09 DIAGNOSIS — Z872 Personal history of diseases of the skin and subcutaneous tissue: Secondary | ICD-10-CM | POA: Diagnosis not present

## 2015-10-09 DIAGNOSIS — Z792 Long term (current) use of antibiotics: Secondary | ICD-10-CM | POA: Insufficient documentation

## 2015-10-09 DIAGNOSIS — R197 Diarrhea, unspecified: Secondary | ICD-10-CM | POA: Diagnosis present

## 2015-10-09 LAB — URINALYSIS, ROUTINE W REFLEX MICROSCOPIC
Bilirubin Urine: NEGATIVE
GLUCOSE, UA: NEGATIVE mg/dL
Hgb urine dipstick: NEGATIVE
KETONES UR: NEGATIVE mg/dL
LEUKOCYTES UA: NEGATIVE
NITRITE: NEGATIVE
Protein, ur: NEGATIVE mg/dL
Specific Gravity, Urine: 1.023 (ref 1.005–1.030)
pH: 8 (ref 5.0–8.0)

## 2015-10-09 LAB — COMPREHENSIVE METABOLIC PANEL
ALT: 18 U/L (ref 17–63)
ANION GAP: 11 (ref 5–15)
AST: 21 U/L (ref 15–41)
Albumin: 4.4 g/dL (ref 3.5–5.0)
Alkaline Phosphatase: 69 U/L (ref 38–126)
BILIRUBIN TOTAL: 0.5 mg/dL (ref 0.3–1.2)
BUN: 20 mg/dL (ref 6–20)
CO2: 25 mmol/L (ref 22–32)
Calcium: 9.7 mg/dL (ref 8.9–10.3)
Chloride: 103 mmol/L (ref 101–111)
Creatinine, Ser: 1.03 mg/dL (ref 0.61–1.24)
GFR calc Af Amer: >60 mL/min (ref >60)
Glucose, Bld: 109 mg/dL — ABNORMAL HIGH (ref 65–99)
POTASSIUM: 4.1 mmol/L (ref 3.5–5.1)
Sodium: 139 mmol/L (ref 135–145)
Total Protein: 8.7 g/dL — ABNORMAL HIGH (ref 6.5–8.1)

## 2015-10-09 LAB — CBC WITH DIFFERENTIAL/PLATELET
BASOS PCT: 0 %
Basophils Absolute: 0 10*3/uL (ref 0.0–0.1)
Eosinophils Absolute: 0 10*3/uL (ref 0.0–0.7)
Eosinophils Relative: 0 %
HEMATOCRIT: 44.5 % (ref 39.0–52.0)
Hemoglobin: 15.6 g/dL (ref 13.0–17.0)
LYMPHS PCT: 21 %
Lymphs Abs: 1.4 10*3/uL (ref 0.7–4.0)
MCH: 28 pg (ref 26.0–34.0)
MCHC: 35.1 g/dL (ref 30.0–36.0)
MCV: 79.7 fL (ref 78.0–100.0)
MONOS PCT: 15 %
Monocytes Absolute: 1 10*3/uL (ref 0.1–1.0)
Neutro Abs: 4.3 10*3/uL (ref 1.7–7.7)
Neutrophils Relative %: 64 %
Platelets: 268 10*3/uL (ref 150–400)
RBC: 5.58 MIL/uL (ref 4.22–5.81)
RDW: 14.5 % (ref 11.5–15.5)
WBC: 6.7 10*3/uL (ref 4.0–10.5)

## 2015-10-09 LAB — LIPASE, BLOOD: LIPASE: 75 U/L — AB (ref 11–51)

## 2015-10-09 MED ORDER — MORPHINE SULFATE (PF) 4 MG/ML IV SOLN
4.0000 mg | Freq: Once | INTRAVENOUS | Status: AC
  Administered 2015-10-09: 4 mg via INTRAVENOUS
  Filled 2015-10-09: qty 1

## 2015-10-09 MED ORDER — ONDANSETRON HCL 4 MG/2ML IJ SOLN
4.0000 mg | Freq: Once | INTRAMUSCULAR | Status: AC
  Administered 2015-10-09: 4 mg via INTRAVENOUS
  Filled 2015-10-09: qty 2

## 2015-10-09 MED ORDER — ONDANSETRON 8 MG PO TBDP: 8.0000 mg | ORAL_TABLET | Freq: Three times a day (TID) | ORAL | Status: DC | PRN

## 2015-10-09 MED ORDER — LORAZEPAM 2 MG/ML IJ SOLN
1.0000 mg | Freq: Once | INTRAMUSCULAR | Status: AC
  Administered 2015-10-09: 1 mg via INTRAVENOUS
  Filled 2015-10-09: qty 1

## 2015-10-09 MED ORDER — PANTOPRAZOLE SODIUM 40 MG IV SOLR
40.0000 mg | Freq: Once | INTRAVENOUS | Status: AC
  Administered 2015-10-09: 40 mg via INTRAVENOUS
  Filled 2015-10-09: qty 40

## 2015-10-09 MED ORDER — GI COCKTAIL ~~LOC~~
30.0000 mL | Freq: Once | ORAL | Status: AC
  Administered 2015-10-09: 30 mL via ORAL
  Filled 2015-10-09: qty 30

## 2015-10-09 MED ORDER — HYDROMORPHONE HCL 1 MG/ML IJ SOLN
1.0000 mg | Freq: Once | INTRAMUSCULAR | Status: AC
  Administered 2015-10-09: 1 mg via INTRAVENOUS
  Filled 2015-10-09: qty 1

## 2015-10-09 MED ORDER — OXYCODONE-ACETAMINOPHEN 5-325 MG PO TABS: 1.0000 | ORAL_TABLET | ORAL | Status: DC | PRN

## 2015-10-09 MED ORDER — SODIUM CHLORIDE 0.9 % IV SOLN
INTRAVENOUS | Status: DC
  Administered 2015-10-09: 125 mL/h via INTRAVENOUS

## 2015-10-09 MED ORDER — SODIUM CHLORIDE 0.9 % IV BOLUS (SEPSIS)
2000.0000 mL | Freq: Once | INTRAVENOUS | Status: AC
  Administered 2015-10-09: 2000 mL via INTRAVENOUS

## 2015-10-09 MED ORDER — SUCRALFATE 1 G PO TABS
1.0000 g | ORAL_TABLET | Freq: Three times a day (TID) | ORAL | Status: DC
  Administered 2015-10-09: 1 g via ORAL
  Filled 2015-10-09: qty 1

## 2015-10-09 MED ORDER — SUCRALFATE 1 G PO TABS: 1.0000 g | ORAL_TABLET | Freq: Four times a day (QID) | ORAL | Status: DC

## 2015-10-09 NOTE — ED Notes (Signed)
Bed: WA01 Expected date:  Expected time:  Means of arrival:  Comments: EMS- 49yo N/V

## 2015-10-09 NOTE — ED Provider Notes (Signed)
CSN: [id number]     Arrival date & time 10/09/15  1641 History   First MD Initiated Contact with Patient 10/09/15 1648     Chief Complaint  Patient presents with  . Emesis  . Diarrhea  . Abdominal Pain     (Consider location/radiation/quality/duration/timing/severity/associated sxs/prior Treatment) HPI Comments: Patient here complaining of 34 hours of nausea vomiting or diarrhea. Vomiting has been nonbilious and diarrhea has been watery. States he ate some very old barbecue and thinks that this is the culprit. Denies any fever or chills. Abdominal pain is. The umbilicus does not radiate. Denies any urinary symptoms. EMS was called and patient transported here. Denies any recent URI symptoms. No treatment use prior to arrival  The history is provided by the patient.    Past Medical History  Diagnosis Date  . Hypertension   . Diabetes mellitus   . Pneumonia   . COPD (chronic obstructive pulmonary disease) (HCC)     chronic Bronchhititis  . Complication of anesthesia   . Seizures (HCC)     last one 3 years ago.  Md discontinued meds- Dr Anne Hahn  . 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   Past Surgical History  Procedure Laterality Date  . Tonsillectomy    . Hip surgery      rods to each hip- hip can ot of socket.  . Wrist surgery      repair due to crush  . Back surgery      5 from degenerative disease..    . Anterior cervical decomp/discectomy fusion  12/20/2011    Procedure: ANTERIOR CERVICAL DECOMPRESSION/DISCECTOMY FUSION 2 LEVELS;  Surgeon: Mariam Dollar, MD;  Location: [facility];  Service: Neurosurgery;  Laterality: N/A;  Cervical four-five, five-six anterior cervical decompression/discectomy fusion with plating   No family history on file. Social History  Substance Use Topics  . Smoking status: Current Every Day Smoker -- 0.50 packs/day  . Smokeless tobacco: Not on file  . Alcohol Use: No     Comment: occassional     Review of Systems  All other  systems reviewed and are negative.     Allergies  Darvocet and Onion  Home Medications   Prior to Admission medications   Medication Sig Start Date End Date Taking? Authorizing Provider  acetaminophen (TYLENOL) 500 MG tablet Take 1,000 mg by mouth every 6 (six) hours as needed for pain. For pain    Historical Provider, MD  albuterol (PROVENTIL HFA;VENTOLIN HFA) 108 (90 Base) MCG/ACT inhaler Inhale 2 puffs into the lungs every 4 (four) hours as needed for wheezing or shortness of breath. 09/08/15   Hayden Rasmussen, NP  amLODipine (NORVASC) 10 MG tablet Take 10 mg by mouth daily.    Historical Provider, MD  atenolol (TENORMIN) 25 MG tablet Take 25 mg by mouth daily.    Historical Provider, MD  azithromycin (ZITHROMAX) 250 MG tablet Take 1 tablet (250 mg total) by mouth once. 2 tabs po on day one, then one tablet po once daily on days 2-5. 09/08/15   Hayden Rasmussen, NP  doxycycline (VIBRAMYCIN) 100 MG capsule Take 1 capsule (100 mg total) by mouth 2 (two) times daily. 08/12/15   Barbaraann Barthel, MD  famotidine (PEPCID) 20 MG tablet Take 1 tablet (20 mg total) by mouth 2 (two) times daily. 03/21/14   Christopher Lawyer, PA-C  glimepiride (AMARYL) 2 MG tablet Take 2 mg by mouth daily before breakfast.    Historical Provider, MD  hydrochlorothiazide (HYDRODIURIL) 25 MG tablet Take 25 mg by mouth daily. 01/05/14   Historical Provider, MD  HYDROcodone-acetaminophen (NORCO) 10-325 MG per tablet Take 1 tablet by mouth every 4 (four) hours as needed. 02/26/14   Historical Provider, MD  HYDROcodone-acetaminophen (NORCO/VICODIN) 5-325 MG per tablet Take 1 tablet by mouth every 6 (six) hours as needed for moderate pain. 03/21/14   Christopher Lawyer, PA-C  HYDROcodone-homatropine (HYCODAN) 5-1.5 MG/5ML syrup Take 5 mLs by mouth every 6 (six) hours as needed for cough. 03/02/14   Jennifer Piepenbrink, PA-C  metFORMIN (GLUCOPHAGE) 850 MG tablet Take 850 mg by mouth 2 (two) times daily with a meal.    Historical Provider, MD   predniSONE (DELTASONE) 20 MG tablet 3 Tabs PO Days 1-3, then 2 tabs PO Days 4-6, then 1 tab PO Day 7-9, then Half Tab PO Day 10-12 09/08/15   Hayden Rasmussenavid Mabe, NP  promethazine (PHENERGAN) 25 MG tablet Take 1 tablet (25 mg total) by mouth every 8 (eight) hours as needed for nausea or vomiting. 03/21/14   Charlestine Nighthristopher Lawyer, PA-C  sucralfate (CARAFATE) 1 G tablet Take 1 tablet (1 g total) by mouth 4 (four) times daily -  with meals and at bedtime. 03/21/14   Christopher Lawyer, PA-C   BP 158/84 mmHg  Pulse 64  Temp(Src) 98.1 F (36.7 C) (Oral)  Resp 22  SpO2 100% Physical Exam  Constitutional: He is oriented to person, place, and time. He appears well-developed and well-nourished.  Non-toxic appearance. No distress.  HENT:  Head: Normocephalic and atraumatic.  Eyes: Conjunctivae, EOM and lids are normal. Pupils are equal, round, and reactive to light.  Neck: Normal range of motion. Neck supple. No tracheal deviation present. No thyroid mass present.  Cardiovascular: Normal rate, regular rhythm and normal heart sounds.  Exam reveals no gallop.   No murmur heard. Pulmonary/Chest: Effort normal and breath sounds normal. No stridor. No respiratory distress. He has no decreased breath sounds. He has no wheezes. He has no rhonchi. He has no rales.  Abdominal: Soft. Normal appearance and bowel sounds are normal. He exhibits no distension. There is no tenderness. There is no rebound and no CVA tenderness.  Musculoskeletal: Normal range of motion. He exhibits no edema or tenderness.  Neurological: He is alert and oriented to person, place, and time. He has normal strength. No cranial nerve deficit or sensory deficit. GCS eye subscore is 4. GCS verbal subscore is 5. GCS motor subscore is 6.  Skin: Skin is warm and dry. No abrasion and no rash noted.  Psychiatric: He has a normal mood and affect. His speech is normal and behavior is normal.  Nursing note and vitals reviewed.   ED Course  Procedures  (including critical care time) Labs Review Labs Reviewed  CBC WITH DIFFERENTIAL/PLATELET  COMPREHENSIVE METABOLIC PANEL  LIPASE, BLOOD  URINALYSIS, ROUTINE W REFLEX MICROSCOPIC (NOT AT [facility])  BLOOD GAS, VENOUS    Imaging Review No results found. I have personally reviewed and evaluated these images and lab results as part of my medical decision-making.   EKG Interpretation None      MDM   Final diagnoses:  None    Patient given IV fluids and medications for pain here. Lipase is mildly elevated. He has had no emesis here. Is able to drink fluids here. Suspect that he has mild gastritis and was discharged home    Lorre NickAnthony Summar Mcglothlin, MD 10/09/15 2053

## 2015-10-09 NOTE — Discharge Instructions (Signed)
Use the clear liquid diet for the next 2 days. Return here if you have fever, vomiting, severe abdominal pain Gastritis, Adult Gastritis is soreness and swelling (inflammation) of the lining of the stomach. Gastritis can develop as a sudden onset (acute) or long-term (chronic) condition. If gastritis is not treated, it can lead to stomach bleeding and ulcers. CAUSES  Gastritis occurs when the stomach lining is weak or damaged. Digestive juices from the stomach then inflame the weakened stomach lining. The stomach lining may be weak or damaged due to viral or bacterial infections. One common bacterial infection is the Helicobacter pylori infection. Gastritis can also result from excessive alcohol consumption, taking certain medicines, or having too much acid in the stomach.  SYMPTOMS  In some cases, there are no symptoms. When symptoms are present, they may include:  Pain or a burning sensation in the upper abdomen.  Nausea.  Vomiting.  An uncomfortable feeling of fullness after eating. DIAGNOSIS  Your caregiver may suspect you have gastritis based on your symptoms and a physical exam. To determine the cause of your gastritis, your caregiver may perform the following:  Blood or stool tests to check for the H pylori bacterium.  Gastroscopy. A thin, flexible tube (endoscope) is passed down the esophagus and into the stomach. The endoscope has a light and camera on the end. Your caregiver uses the endoscope to view the inside of the stomach.  Taking a tissue sample (biopsy) from the stomach to examine under a microscope. TREATMENT  Depending on the cause of your gastritis, medicines may be prescribed. If you have a bacterial infection, such as an H pylori infection, antibiotics may be given. If your gastritis is caused by too much acid in the stomach, H2 blockers or antacids may be given. Your caregiver may recommend that you stop taking aspirin, ibuprofen, or other nonsteroidal anti-inflammatory  drugs (NSAIDs). HOME CARE INSTRUCTIONS  Only take over-the-counter or prescription medicines as directed by your caregiver.  If you were given antibiotic medicines, take them as directed. Finish them even if you start to feel better.  Drink enough fluids to keep your urine clear or pale yellow.  Avoid foods and drinks that make your symptoms worse, such as:  Caffeine or alcoholic drinks.  Chocolate.  Peppermint or mint flavorings.  Garlic and onions.  Spicy foods.  Citrus fruits, such as oranges, lemons, or limes.  Tomato-based foods such as sauce, chili, salsa, and pizza.  Fried and fatty foods.  Eat small, frequent meals instead of large meals. SEEK IMMEDIATE MEDICAL CARE IF:   You have black or dark red stools.  You vomit blood or material that looks like coffee grounds.  You are unable to keep fluids down.  Your abdominal pain gets worse.  You have a fever.  You do not feel better after 1 week.  You have any other questions or concerns. MAKE SURE YOU:  Understand these instructions.  Will watch your condition.  Will get help right away if you are not doing well or get worse.   This information is not intended to replace advice given to you by your health care provider. Make sure you discuss any questions you have with your health care provider.   Document Released: 06/13/2001 Document Revised: 12/19/2011 Document Reviewed: 08/02/2011 Elsevier Interactive Patient Education 2016 [employer] Inc[employer] Clear Liquid Diet A clear liquid diet is a short-term diet that is prescribed to provide the necessary fluid and basic energy you need when you can have nothing else.  The clear liquid diet consists of liquids or solids that will become liquid at room temperature. You should be able to see through the liquid. There are many reasons that you may be restricted to clear liquids, such as:  When you have a sudden-onset (acute) condition that occurs before or after  surgery.  To help your body slowly get adjusted to food again after a long period when you were unable to have food.  Replacement of fluids when you have a diarrheal disease.  When you are going to have certain exams, such as a colonoscopy, in which instruments are inserted inside your body to look at parts of your digestive system. WHAT CAN I HAVE? A clear liquid diet does not provide all the nutrients you need. It is important to choose a variety of the following items to get as many nutrients as possible:  Vegetable juices that do not have pulp.  Fruit juices and fruit drinks that do not have pulp.  Coffee (regular or decaffeinated), tea, or soda at the discretion of your health care provider.  Clear bouillon, broth, or strained broth-based soups.  High-protein and flavored gelatins.  Sugar or honey.  Ices or frozen ice pops that do not contain milk. If you are not sure whether you can have certain items, you should ask your health care provider. You may also ask your health care provider if there are any other clear liquid options.   This information is not intended to replace advice given to you by your health care provider. Make sure you discuss any questions you have with your health care provider.   Document Released: 06/19/2005 Document Revised: 06/24/2013 Document Reviewed: 05/16/2013 Elsevier Interactive Patient Education [employer]

## 2015-10-09 NOTE — ED Notes (Signed)
Per EMS- patient states he began having N/V/D and periumbilical pain after eating barbecue last night.

## 2015-10-09 NOTE — ED Notes (Signed)
Patient has tolerated sips of water.

## 2015-10-09 NOTE — ED Notes (Signed)
Pt cannot use restroom at this time, aware urine specimen is needed.  

## 2015-10-10 ENCOUNTER — Telehealth: Payer: Self-pay

## 2015-10-11 LAB — BLOOD GAS, VENOUS
Acid-Base Excess: 4.7 mmol/L — ABNORMAL HIGH (ref 0.0–2.0)
Bicarbonate: 24.7 mEq/L — ABNORMAL HIGH (ref 20.0–24.0)
O2 Saturation: 54.1 %
PATIENT TEMPERATURE: 98.6
TCO2: 20.8 mmol/L (ref 0–100)
pCO2, Ven: 24.9 mmHg — ABNORMAL LOW (ref 45.0–50.0)
pH, Ven: 7.602 (ref 7.250–7.300)

## 2015-10-14 MED FILL — SUBOXONE 4 MG-1 MG SL FILM: 4-1 | 30 days supply | Qty: 60 | Fill #0

## 2015-11-11 MED FILL — SUBOXONE 4 MG-1 MG SL FILM: 4-1 | 30 days supply | Qty: 60 | Fill #0

## 2015-12-13 MED FILL — SUBOXONE 4 MG-1 MG SL FILM: 4-1 | 30 days supply | Qty: 60 | Fill #0

## 2016-01-10 MED FILL — SUBOXONE 4 MG-1 MG SL FILM: 4-1 | 30 days supply | Qty: 60 | Fill #0

## 2016-02-09 MED FILL — SUBOXONE 4 MG-1 MG SL FILM: 4-1 | 30 days supply | Qty: 60 | Fill #0

## 2016-03-08 MED FILL — SUBOXONE 4 MG-1 MG SL FILM: 4-1 | 30 days supply | Qty: 60 | Fill #0

## 2016-04-05 MED FILL — SUBOXONE 4 MG-1 MG SL FILM: 4-1 | 30 days supply | Qty: 60 | Fill #0

## 2016-05-05 MED FILL — SUBOXONE 4 MG-1 MG SL FILM: 4-1 | 30 days supply | Qty: 60 | Fill #0

## 2016-06-05 MED FILL — SUBOXONE 4 MG-1 MG SL FILM: 4-1 | 30 days supply | Qty: 60 | Fill #0

## 2016-06-29 ENCOUNTER — Emergency Department (HOSPITAL_COMMUNITY)
Admission: EM | Admit: 2016-06-29 | Discharge: 2016-06-30 | Disposition: A | Discharge status: Home / Self Care | Payer: Medicare Other | Attending: Emergency Medicine | Admitting: Emergency Medicine

## 2016-06-29 ENCOUNTER — Encounter (HOSPITAL_COMMUNITY): Payer: Self-pay | Admitting: Emergency Medicine

## 2016-06-29 DIAGNOSIS — Z7984 Long term (current) use of oral hypoglycemic drugs: Secondary | ICD-10-CM | POA: Insufficient documentation

## 2016-06-29 DIAGNOSIS — E119 Type 2 diabetes mellitus without complications: Secondary | ICD-10-CM | POA: Diagnosis not present

## 2016-06-29 DIAGNOSIS — I10 Essential (primary) hypertension: Secondary | ICD-10-CM | POA: Insufficient documentation

## 2016-06-29 DIAGNOSIS — R112 Nausea with vomiting, unspecified: Secondary | ICD-10-CM

## 2016-06-29 DIAGNOSIS — F17200 Nicotine dependence, unspecified, uncomplicated: Secondary | ICD-10-CM | POA: Insufficient documentation

## 2016-06-29 DIAGNOSIS — J449 Chronic obstructive pulmonary disease, unspecified: Secondary | ICD-10-CM | POA: Diagnosis not present

## 2016-06-29 DIAGNOSIS — R197 Diarrhea, unspecified: Secondary | ICD-10-CM

## 2016-06-29 DIAGNOSIS — N3090 Cystitis, unspecified without hematuria: Secondary | ICD-10-CM | POA: Diagnosis not present

## 2016-06-29 LAB — COMPREHENSIVE METABOLIC PANEL
ALK PHOS: 65 U/L (ref 38–126)
ALT: 12 U/L — ABNORMAL LOW (ref 17–63)
ANION GAP: 7 (ref 5–15)
AST: 17 U/L (ref 15–41)
Albumin: 4.3 g/dL (ref 3.5–5.0)
BILIRUBIN TOTAL: 0.6 mg/dL (ref 0.3–1.2)
BUN: 16 mg/dL (ref 6–20)
CALCIUM: 9.4 mg/dL (ref 8.9–10.3)
CO2: 26 mmol/L (ref 22–32)
CREATININE: 0.96 mg/dL (ref 0.61–1.24)
Chloride: 104 mmol/L (ref 101–111)
GFR calc non Af Amer: >60 mL/min (ref >60)
GLUCOSE: 110 mg/dL — AB (ref 65–99)
Potassium: 3.6 mmol/L (ref 3.5–5.1)
Sodium: 137 mmol/L (ref 135–145)
TOTAL PROTEIN: 8.4 g/dL — AB (ref 6.5–8.1)

## 2016-06-29 LAB — URINALYSIS, ROUTINE W REFLEX MICROSCOPIC
Bacteria, UA: NONE SEEN
Bilirubin Urine: NEGATIVE
GLUCOSE, UA: NEGATIVE mg/dL
Hgb urine dipstick: NEGATIVE
KETONES UR: 5 mg/dL — AB
NITRITE: NEGATIVE
PH: 5 (ref 5.0–8.0)
Protein, ur: 100 mg/dL — AB
SPECIFIC GRAVITY, URINE: 1.03 (ref 1.005–1.030)

## 2016-06-29 LAB — CBC
HCT: 43.3 % (ref 39.0–52.0)
HEMOGLOBIN: 14.7 g/dL (ref 13.0–17.0)
MCH: 27.7 pg (ref 26.0–34.0)
MCHC: 33.9 g/dL (ref 30.0–36.0)
MCV: 81.5 fL (ref 78.0–100.0)
PLATELETS: 281 10*3/uL (ref 150–400)
RBC: 5.31 MIL/uL (ref 4.22–5.81)
RDW: 14.5 % (ref 11.5–15.5)
WBC: 5.2 10*3/uL (ref 4.0–10.5)

## 2016-06-29 LAB — CBG MONITORING, ED: Glucose-Capillary: 107 mg/dL — ABNORMAL HIGH (ref 65–99)

## 2016-06-29 LAB — LIPASE, BLOOD: Lipase: 71 U/L — ABNORMAL HIGH (ref 11–51)

## 2016-06-29 MED ORDER — ONDANSETRON 4 MG PO TBDP
4.0000 mg | ORAL_TABLET | Freq: Once | ORAL | Status: AC | PRN
  Administered 2016-06-29: 4 mg via ORAL
  Filled 2016-06-29: qty 1

## 2016-06-29 MED ORDER — NAPROXEN 375 MG PO TABS: 375.0000 mg | ORAL_TABLET | Freq: Two times a day (BID) | ORAL | 0 refills | Status: DC | PRN

## 2016-06-29 MED ORDER — METOCLOPRAMIDE HCL 5 MG/ML IJ SOLN
10.0000 mg | Freq: Once | INTRAMUSCULAR | Status: AC
  Administered 2016-06-29: 10 mg via INTRAVENOUS
  Filled 2016-06-29: qty 2

## 2016-06-29 MED ORDER — ONDANSETRON HCL 4 MG PO TABS: 4.0000 mg | ORAL_TABLET | Freq: Three times a day (TID) | ORAL | 0 refills | Status: DC | PRN

## 2016-06-29 MED ORDER — DEXTROSE 5 % IV SOLN
1.0000 g | Freq: Once | INTRAVENOUS | Status: AC
  Administered 2016-06-30: 1 g via INTRAVENOUS
  Filled 2016-06-29: qty 10

## 2016-06-29 MED ORDER — DIPHENHYDRAMINE HCL 50 MG/ML IJ SOLN
25.0000 mg | Freq: Once | INTRAMUSCULAR | Status: AC
  Administered 2016-06-29: 25 mg via INTRAVENOUS
  Filled 2016-06-29: qty 1

## 2016-06-29 MED ORDER — CEPHALEXIN 500 MG PO CAPS: 500.0000 mg | ORAL_CAPSULE | Freq: Three times a day (TID) | ORAL | 0 refills | Status: AC

## 2016-06-29 MED ORDER — HYDROMORPHONE HCL 2 MG/ML IJ SOLN
1.0000 mg | Freq: Once | INTRAMUSCULAR | Status: AC
  Administered 2016-06-29: 1 mg via INTRAVENOUS
  Filled 2016-06-29: qty 1

## 2016-06-29 MED ORDER — PROMETHAZINE HCL 25 MG RE SUPP: 25.0000 mg | Freq: Four times a day (QID) | RECTAL | 0 refills | Status: DC | PRN

## 2016-06-29 MED ORDER — MORPHINE SULFATE (PF) 4 MG/ML IV SOLN
4.0000 mg | Freq: Once | INTRAVENOUS | Status: DC
Start: 2016-06-29 — End: 2016-06-29

## 2016-06-29 MED ORDER — SODIUM CHLORIDE 0.9 % IV BOLUS (SEPSIS)
1000.0000 mL | Freq: Once | INTRAVENOUS | Status: AC
  Administered 2016-06-29: 1000 mL via INTRAVENOUS

## 2016-06-29 NOTE — ED Notes (Signed)
Given water PO per request

## 2016-06-29 NOTE — ED Notes (Signed)
Pt stating his nausea is gone but he can't breath good because he is in so much pain

## 2016-06-29 NOTE — ED Notes (Signed)
Fluid/PO Challenge, able to keep down fluids.

## 2016-06-29 NOTE — ED Provider Notes (Signed)
[facility]-EMERGENCY DEPT Provider Note   CSN: [id number] Arrival date & time: 06/29/16  1549     History   Chief Complaint Chief Complaint  Patient presents with  . Abdominal Pain  . Emesis    HPI [name] is a 50 y.o. male.  HPI 50 year old male with past medical history of chronic abdominal pain, diabetes, hypertension, who presents with nausea, vomiting, diarrhea. Patient states that he ate a fish sandwich from [employer] last night. Approximately 2:30 AM, he awoke with nausea, cramp-like abdominal pain, followed by profuse vomiting. Denies any blood in his emesis. He then developed watery, nonbloody diarrhea. Throughout the day today, has been unable to eat or drink due to this. He has also had mild urinary frequency. Denies any fevers. Denies any sick contacts. Symptoms are worse with any attempted eating. Denies any alleviating factors.  Past Medical History:  Diagnosis Date  . Complication of anesthesia   . COPD (chronic obstructive pulmonary disease) (HCC)    chronic Bronchhititis  . Diabetes mellitus   . Hypertension   . 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  . Pneumonia   . Seizures (HCC)    last one 3 years ago.  Md discontinued meds- Dr Anne Hahn    There are no active problems to display for this patient.   Past Surgical History:  Procedure Laterality Date  . ANTERIOR CERVICAL DECOMP/DISCECTOMY FUSION  12/20/2011   Procedure: ANTERIOR CERVICAL DECOMPRESSION/DISCECTOMY FUSION 2 LEVELS;  Surgeon: Mariam Dollar, MD;  Location: [facility];  Service: Neurosurgery;  Laterality: N/A;  Cervical four-five, five-six anterior cervical decompression/discectomy fusion with plating  . BACK SURGERY     5 from degenerative disease..    . HIP SURGERY     rods to each hip- hip can ot of socket.  . TONSILLECTOMY    . WRIST SURGERY     repair due to crush       Home Medications    Prior to Admission medications   Medication Sig Start Date End Date Taking?  Authorizing Provider  ADVAIR DISKUS 500-50 MCG/DOSE AEPB Inhale 1 puff into the lungs daily.  06/02/16  Yes Historical Provider, MD  albuterol (PROVENTIL HFA;VENTOLIN HFA) 108 (90 Base) MCG/ACT inhaler Inhale 2 puffs into the lungs every 4 (four) hours as needed for wheezing or shortness of breath. 09/08/15  Yes Hayden Rasmussen, NP  amLODipine (NORVASC) 10 MG tablet Take 10 mg by mouth daily.   Yes Historical Provider, MD  glimepiride (AMARYL) 2 MG tablet Take 2 mg by mouth daily before breakfast.   Yes Historical Provider, MD  hydrochlorothiazide (HYDRODIURIL) 25 MG tablet Take 25 mg by mouth daily. 01/05/14  Yes Historical Provider, MD  ipratropium-albuterol (DUONEB) 0.5-2.5 (3) MG/3ML SOLN INHALE 1 VIAL BY NEBULIZATION Q 8 TO 12 H PRN FOR SOB AND WHEEZING 06/02/16  Yes Historical Provider, MD  metFORMIN (GLUCOPHAGE) 850 MG tablet Take 850 mg by mouth 2 (two) times daily with a meal.   Yes Historical Provider, MD  SUBOXONE 4-1 MG FILM Take 1 Film by mouth 2 (two) times daily.  06/05/16  Yes Historical Provider, MD  acetaminophen (TYLENOL) 500 MG tablet Take 1,000 mg by mouth every 6 (six) hours as needed for pain. For pain    Historical Provider, MD  azithromycin (ZITHROMAX) 250 MG tablet Take 1 tablet (250 mg total) by mouth once. 2 tabs po on day one, then one tablet po once daily on days 2-5. Patient not taking:  Reported on 06/29/2016 09/08/15   Hayden Rasmussen, NP  cephALEXin (KEFLEX) 500 MG capsule Take 1 capsule (500 mg total) by mouth 3 (three) times daily. 06/29/16 07/06/16  Shaune Pollack, MD  doxycycline (VIBRAMYCIN) 100 MG capsule Take 1 capsule (100 mg total) by mouth 2 (two) times daily. Patient not taking: Reported on 06/29/2016 08/12/15   Barbaraann Barthel, MD  famotidine (PEPCID) 20 MG tablet Take 1 tablet (20 mg total) by mouth 2 (two) times daily. Patient not taking: Reported on 06/29/2016 03/21/14   Charlestine Night, PA-C  HYDROcodone-acetaminophen (NORCO/VICODIN) 5-325 MG per tablet Take 1 tablet by  mouth every 6 (six) hours as needed for moderate pain. Patient not taking: Reported on 06/29/2016 03/21/14   Charlestine Night, PA-C  HYDROcodone-homatropine [facility]) 5-1.5 MG/5ML syrup Take 5 mLs by mouth every 6 (six) hours as needed for cough. Patient not taking: Reported on 06/29/2016 03/02/14   Francee Piccolo, PA-C  naproxen (NAPROSYN) 375 MG tablet Take 1 tablet (375 mg total) by mouth 2 (two) times daily as needed for moderate pain. 06/29/16 07/06/16  Shaune Pollack, MD  ondansetron (ZOFRAN ODT) 8 MG disintegrating tablet Take 1 tablet (8 mg total) by mouth every 8 (eight) hours as needed for nausea or vomiting. Patient not taking: Reported on 06/29/2016 10/09/15   Lorre Nick, MD  ondansetron [facility]) 4 MG tablet Take 1 tablet (4 mg total) by mouth every 8 (eight) hours as needed for nausea or vomiting. 06/29/16   Shaune Pollack, MD  oxyCODONE-acetaminophen (PERCOCET/ROXICET) 5-325 MG tablet Take 1-2 tablets by mouth every 4 (four) hours as needed for severe pain. Patient not taking: Reported on 06/29/2016 10/09/15   Lorre Nick, MD  predniSONE (DELTASONE) 20 MG tablet 3 Tabs PO Days 1-3, then 2 tabs PO Days 4-6, then 1 tab PO Day 7-9, then Half Tab PO Day 10-12 Patient not taking: Reported on 06/29/2016 09/08/15   Hayden Rasmussen, NP  promethazine (PHENERGAN) 25 MG suppository Place 1 suppository (25 mg total) rectally every 6 (six) hours as needed for nausea or vomiting. 06/29/16   Shaune Pollack, MD  sucralfate (CARAFATE) 1 g tablet Take 1 tablet (1 g total) by mouth 4 (four) times daily. Patient not taking: Reported on 06/29/2016 10/09/15   Lorre Nick, MD    Family History Family History  Problem Relation Age of Onset  . Family history unknown: Yes    Social History Social History  Substance Use Topics  . Smoking status: Current Every Day Smoker    Packs/day: 0.50  . Smokeless tobacco: Never Used  . Alcohol use No     Comment: occassional      Allergies   Darvocet  [propoxyphene n-acetaminophen] and Onion   Review of Systems Review of Systems  Constitutional: Positive for fatigue. Negative for chills and fever.  HENT: Negative for congestion and rhinorrhea.   Eyes: Negative for visual disturbance.  Respiratory: Negative for cough, shortness of breath and wheezing.   Cardiovascular: Negative for chest pain and leg swelling.  Gastrointestinal: Positive for abdominal pain, diarrhea, nausea and vomiting.  Genitourinary: Negative for dysuria and flank pain.  Musculoskeletal: Negative for neck pain and neck stiffness.  Skin: Negative for rash and wound.  Allergic/Immunologic: Negative for immunocompromised state.  Neurological: Negative for syncope, weakness and headaches.  All other systems reviewed and are negative.    Physical Exam Updated Vital Signs BP 158/92   Pulse 64   Temp 98.4 F (36.9 C) (Oral)   Resp 18   Ht 5'  9" (1.753 m)   Wt 140 lb (63.5 kg)   SpO2 100%   BMI 20.67 kg/m   Physical Exam  Constitutional: He is oriented to person, place, and time. He appears well-developed and well-nourished. No distress.  HENT:  Head: Normocephalic and atraumatic.  Mildly dry mucous membranes  Eyes: Conjunctivae are normal.  Neck: Neck supple.  Cardiovascular: Normal rate, regular rhythm and normal heart sounds.  Exam reveals no friction rub.   No murmur heard. Pulmonary/Chest: Effort normal and breath sounds normal. No respiratory distress. He has no wheezes. He has no rales.  Abdominal: Soft. Bowel sounds are normal. He exhibits no distension. There is tenderness (Mild, epigastric). There is no rebound and no guarding.  Musculoskeletal: He exhibits no edema.  Neurological: He is alert and oriented to person, place, and time. He exhibits normal muscle tone.  Skin: Skin is warm. Capillary refill takes less than 2 seconds.  Psychiatric: He has a normal mood and affect.  Nursing note and vitals reviewed.    ED Treatments / Results    Labs (all labs ordered are listed, but only abnormal results are displayed) Labs Reviewed  LIPASE, BLOOD - Abnormal; Notable for the following:       Result Value   Lipase 71 (*)    All other components within normal limits  COMPREHENSIVE METABOLIC PANEL - Abnormal; Notable for the following:    Glucose, Bld 110 (*)    Total Protein 8.4 (*)    ALT 12 (*)    All other components within normal limits  URINALYSIS, ROUTINE W REFLEX MICROSCOPIC - Abnormal; Notable for the following:    Color, Urine AMBER (*)    APPearance HAZY (*)    Ketones, ur 5 (*)    Protein, ur 100 (*)    Leukocytes, UA TRACE (*)    Squamous Epithelial / LPF 0-5 (*)    All other components within normal limits  CBG MONITORING, ED - Abnormal; Notable for the following:    Glucose-Capillary 107 (*)    All other components within normal limits  URINE CULTURE  CBC    EKG  EKG Interpretation None       Radiology No results found.  Procedures Procedures (including critical care time)  Medications Ordered in ED Medications  ondansetron (ZOFRAN-ODT) disintegrating tablet 4 mg (4 mg Oral Given 06/29/16 1605)  sodium chloride 0.9 % bolus 1,000 mL (0 mLs Intravenous Stopped 06/29/16 2153)  metoCLOPramide (REGLAN) injection 10 mg (10 mg Intravenous Given 06/29/16 2101)  diphenhydrAMINE (BENADRYL) injection 25 mg (25 mg Intravenous Given 06/29/16 2102)  HYDROmorphone (DILAUDID) injection 1 mg (1 mg Intravenous Given 06/29/16 2102)  cefTRIAXone (ROCEPHIN) 1 g in dextrose 5 % 50 mL IVPB (0 g Intravenous Stopped 06/30/16 0022)  ondansetron (ZOFRAN) injection 4 mg (4 mg Intravenous Given 06/30/16 0022)     Initial Impression / Assessment and Plan / ED Course  I have reviewed the triage vital signs and the nursing notes.  Pertinent labs & imaging results that were available during my care of the patient were reviewed by me and considered in my medical decision making (see chart for details).  Clinical Course      50 yo M with PMHx as above here with n/v/d x 24 hours. On arrival, VSS and WNL. Exam is as above. Pt mildly dehydrated but abdomen is soft, minimally tender, and non-distended. Suspect viral GI illness versus gastroparesis versus food-borne illness. No RUQ TTP or signs to suggest cholecystitis. Lab work  obtained and is overall reassuring. WBC normal, LFTs wnl, and renal function at baseline. UA does show mild dehydration, possible UTI but he has no flank pain or signs of pyelo. Will give IVF, sx control, and re-assess.  Abdomen now completely soft after treatment. Given normal WBC, labs, do not feel imaging indicated. Pt tolerating PO and feels better. Will dc with supportive care, fluids, and PCP f/u.  Final Clinical Impressions(s) / ED Diagnoses   Final diagnoses:  Nausea vomiting and diarrhea  Cystitis    New Prescriptions Discharge Medication List as of 06/29/2016 11:45 PM    START taking these medications   Details  cephALEXin (KEFLEX) 500 MG capsule Take 1 capsule (500 mg total) by mouth 3 (three) times daily., Starting Thu 06/29/2016, Until Thu 07/06/2016, Print    naproxen (NAPROSYN) 375 MG tablet Take 1 tablet (375 mg total) by mouth 2 (two) times daily as needed for moderate pain., Starting Thu 06/29/2016, Until Thu 07/06/2016, Print    ondansetron (ZOFRAN) 4 MG tablet Take 1 tablet (4 mg total) by mouth every 8 (eight) hours as needed for nausea or vomiting., Starting Thu 06/29/2016, Print    promethazine (PHENERGAN) 25 MG suppository Place 1 suppository (25 mg total) rectally every 6 (six) hours as needed for nausea or vomiting., Starting Thu 06/29/2016, Print         Shaune Pollackameron Torres [place], MD 06/30/16 1304

## 2016-06-29 NOTE — ED Notes (Signed)
No changes, "feel better", Dr. Erma HeritageIsaacs in to update pt with results and plan. Pt alert, NAD, calm, interactive.

## 2016-06-29 NOTE — ED Triage Notes (Signed)
Per EMS, patient is complaining of abdominal pain, nausea, vomiting, and diarrhea. Patient takes suboxone. Patient has not been able to take this medication or any other medications.

## 2016-06-30 DIAGNOSIS — N3090 Cystitis, unspecified without hematuria: Secondary | ICD-10-CM | POA: Diagnosis not present

## 2016-06-30 MED ORDER — ONDANSETRON HCL 4 MG/2ML IJ SOLN
4.0000 mg | Freq: Once | INTRAMUSCULAR | Status: AC
  Administered 2016-06-30: 4 mg via INTRAVENOUS
  Filled 2016-06-30: qty 2

## 2016-07-01 ENCOUNTER — Emergency Department (HOSPITAL_COMMUNITY): Payer: Medicare Other

## 2016-07-01 ENCOUNTER — Emergency Department (HOSPITAL_COMMUNITY)
Admission: EM | Admit: 2016-07-01 | Discharge: 2016-07-02 | Disposition: A | Discharge status: Home / Self Care | Payer: Medicare Other | Attending: Emergency Medicine | Admitting: Emergency Medicine

## 2016-07-01 ENCOUNTER — Encounter (HOSPITAL_COMMUNITY): Payer: Self-pay | Admitting: Emergency Medicine

## 2016-07-01 DIAGNOSIS — R1084 Generalized abdominal pain: Secondary | ICD-10-CM | POA: Insufficient documentation

## 2016-07-01 DIAGNOSIS — I10 Essential (primary) hypertension: Secondary | ICD-10-CM | POA: Diagnosis not present

## 2016-07-01 DIAGNOSIS — E119 Type 2 diabetes mellitus without complications: Secondary | ICD-10-CM | POA: Diagnosis not present

## 2016-07-01 DIAGNOSIS — R197 Diarrhea, unspecified: Secondary | ICD-10-CM | POA: Insufficient documentation

## 2016-07-01 DIAGNOSIS — R112 Nausea with vomiting, unspecified: Secondary | ICD-10-CM | POA: Diagnosis not present

## 2016-07-01 DIAGNOSIS — J449 Chronic obstructive pulmonary disease, unspecified: Secondary | ICD-10-CM | POA: Diagnosis not present

## 2016-07-01 DIAGNOSIS — Z79899 Other long term (current) drug therapy: Secondary | ICD-10-CM | POA: Insufficient documentation

## 2016-07-01 DIAGNOSIS — Z7984 Long term (current) use of oral hypoglycemic drugs: Secondary | ICD-10-CM | POA: Insufficient documentation

## 2016-07-01 DIAGNOSIS — F17200 Nicotine dependence, unspecified, uncomplicated: Secondary | ICD-10-CM | POA: Insufficient documentation

## 2016-07-01 LAB — CBC WITH DIFFERENTIAL/PLATELET
Basophils Absolute: 0 10*3/uL (ref 0.0–0.1)
Basophils Relative: 0 %
EOS ABS: 0 10*3/uL (ref 0.0–0.7)
EOS PCT: 1 %
HCT: 41.4 % (ref 39.0–52.0)
Hemoglobin: 14.5 g/dL (ref 13.0–17.0)
LYMPHS ABS: 2.2 10*3/uL (ref 0.7–4.0)
Lymphocytes Relative: 33 %
MCH: 28.7 pg (ref 26.0–34.0)
MCHC: 35 g/dL (ref 30.0–36.0)
MCV: 81.8 fL (ref 78.0–100.0)
MONOS PCT: 10 %
Monocytes Absolute: 0.7 10*3/uL (ref 0.1–1.0)
Neutro Abs: 3.8 10*3/uL (ref 1.7–7.7)
Neutrophils Relative %: 56 %
PLATELETS: 292 10*3/uL (ref 150–400)
RBC: 5.06 MIL/uL (ref 4.22–5.81)
RDW: 14.4 % (ref 11.5–15.5)
WBC: 6.7 10*3/uL (ref 4.0–10.5)

## 2016-07-01 LAB — COMPREHENSIVE METABOLIC PANEL
ALT: 14 U/L — ABNORMAL LOW (ref 17–63)
ANION GAP: 9 (ref 5–15)
AST: 20 U/L (ref 15–41)
Albumin: 3.9 g/dL (ref 3.5–5.0)
Alkaline Phosphatase: 64 U/L (ref 38–126)
BUN: 10 mg/dL (ref 6–20)
CALCIUM: 9.6 mg/dL (ref 8.9–10.3)
CHLORIDE: 105 mmol/L (ref 101–111)
CO2: 25 mmol/L (ref 22–32)
Creatinine, Ser: 0.97 mg/dL (ref 0.61–1.24)
GFR calc non Af Amer: >60 mL/min (ref >60)
Glucose, Bld: 103 mg/dL — ABNORMAL HIGH (ref 65–99)
POTASSIUM: 3.5 mmol/L (ref 3.5–5.1)
SODIUM: 139 mmol/L (ref 135–145)
Total Bilirubin: 0.6 mg/dL (ref 0.3–1.2)
Total Protein: 7.8 g/dL (ref 6.5–8.1)

## 2016-07-01 LAB — URINALYSIS, ROUTINE W REFLEX MICROSCOPIC
BACTERIA UA: NONE SEEN
BILIRUBIN URINE: NEGATIVE
Glucose, UA: NEGATIVE mg/dL
HGB URINE DIPSTICK: NEGATIVE
KETONES UR: 5 mg/dL — AB
NITRITE: NEGATIVE
PH: 5 (ref 5.0–8.0)
Protein, ur: 30 mg/dL — AB
SPECIFIC GRAVITY, URINE: 1.026 (ref 1.005–1.030)

## 2016-07-01 LAB — RAPID URINE DRUG SCREEN, HOSP PERFORMED
Amphetamines: NOT DETECTED
BENZODIAZEPINES: NOT DETECTED
Barbiturates: NOT DETECTED
COCAINE: NOT DETECTED
OPIATES: NOT DETECTED
Tetrahydrocannabinol: NOT DETECTED

## 2016-07-01 LAB — URINE CULTURE

## 2016-07-01 LAB — LIPASE, BLOOD: Lipase: 42 U/L (ref 11–51)

## 2016-07-01 MED ORDER — SODIUM CHLORIDE 0.9 % IV BOLUS (SEPSIS)
2000.0000 mL | Freq: Once | INTRAVENOUS | Status: AC
  Administered 2016-07-01: 2000 mL via INTRAVENOUS

## 2016-07-01 MED ORDER — PROMETHAZINE HCL 25 MG/ML IJ SOLN
25.0000 mg | Freq: Once | INTRAMUSCULAR | Status: AC
  Administered 2016-07-01: 25 mg via INTRAVENOUS
  Filled 2016-07-01: qty 1

## 2016-07-01 MED ORDER — OXYCODONE-ACETAMINOPHEN 5-325 MG PO TABS
1.0000 | ORAL_TABLET | Freq: Once | ORAL | Status: AC
  Administered 2016-07-01: 1 via ORAL
  Filled 2016-07-01: qty 1

## 2016-07-01 MED ORDER — FAMOTIDINE IN NACL 20-0.9 MG/50ML-% IV SOLN
20.0000 mg | Freq: Once | INTRAVENOUS | Status: AC
  Administered 2016-07-01: 20 mg via INTRAVENOUS
  Filled 2016-07-01: qty 50

## 2016-07-01 MED ORDER — MORPHINE SULFATE (PF) 4 MG/ML IV SOLN
4.0000 mg | Freq: Once | INTRAVENOUS | Status: AC
  Administered 2016-07-01: 4 mg via INTRAVENOUS
  Filled 2016-07-01: qty 1

## 2016-07-01 MED ORDER — IOPAMIDOL (ISOVUE-300) INJECTION 61%
INTRAVENOUS | Status: AC
  Administered 2016-07-01: 100 mL
  Filled 2016-07-01: qty 100

## 2016-07-01 MED ORDER — LORAZEPAM 2 MG/ML IJ SOLN
1.0000 mg | Freq: Once | INTRAMUSCULAR | Status: AC
  Administered 2016-07-01: 1 mg via INTRAVENOUS
  Filled 2016-07-01: qty 1

## 2016-07-01 MED ORDER — HALOPERIDOL LACTATE 5 MG/ML IJ SOLN
5.0000 mg | Freq: Once | INTRAMUSCULAR | Status: AC
  Administered 2016-07-01: 5 mg via INTRAVENOUS
  Filled 2016-07-01: qty 1

## 2016-07-01 MED ORDER — ONDANSETRON HCL 4 MG/2ML IJ SOLN
4.0000 mg | Freq: Once | INTRAMUSCULAR | Status: AC
  Administered 2016-07-01: 4 mg via INTRAVENOUS
  Filled 2016-07-01: qty 2

## 2016-07-01 MED ORDER — DIPHENHYDRAMINE HCL 50 MG/ML IJ SOLN
25.0000 mg | Freq: Once | INTRAMUSCULAR | Status: AC
  Administered 2016-07-01: 25 mg via INTRAVENOUS
  Filled 2016-07-01: qty 1

## 2016-07-01 NOTE — ED Notes (Signed)
Nicole PA at bedside   

## 2016-07-01 NOTE — ED Notes (Signed)
Spoke with CT. Currently on their way to take pt for scan.

## 2016-07-01 NOTE — ED Provider Notes (Signed)
[facility]-EMERGENCY DEPT Provider Note   CSN: [id number] Arrival date & time: 07/01/16  1350     History   Chief Complaint Chief Complaint  Patient presents with  . Abdominal Pain  . Nausea  . Emesis    HPI  Blood pressure 165/96, pulse 78, temperature 98.2 F (36.8 C), temperature source Oral, resp. rate 20, height 5\' 9"  (1.753 m), weight 63.5 kg, SpO2 96 %.  [name] is a 50 y.o. male with past medical history significant for COPD, hypertension, diabetes complaining of nausea vomiting diarrhea worsening over the last several days. He endorses urinary frequency with no dysuria or urethral discharge. States he's not concerned about STDs and hasn't had sex " 81 days." He was seen for similar several days ago and was unable to fill his prescriptions because they were too expensive. He denies fever but endorses severe epigastric abdominal pain. He denies melena, hematochezia, hematemesis, sick contacts.   Past Medical History:  Diagnosis Date  . Complication of anesthesia   . COPD (chronic obstructive pulmonary disease) (HCC)    chronic Bronchhititis  . Diabetes mellitus   . Hypertension   . 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  . Pneumonia   . Seizures (HCC)    last one 3 years ago.  Md discontinued meds- Dr Anne HahnWillis    There are no active problems to display for this patient.   Past Surgical History:  Procedure Laterality Date  . ANTERIOR CERVICAL DECOMP/DISCECTOMY FUSION  12/20/2011   Procedure: ANTERIOR CERVICAL DECOMPRESSION/DISCECTOMY FUSION 2 LEVELS;  Surgeon: Mariam DollarGary P Cram, MD;  Location: [facility];  Service: Neurosurgery;  Laterality: N/A;  Cervical four-five, five-six anterior cervical decompression/discectomy fusion with plating  . BACK SURGERY     5 from degenerative disease..    . HIP SURGERY     rods to each hip- hip can ot of socket.  . TONSILLECTOMY    . WRIST SURGERY     repair due to crush       Home Medications    Prior to  Admission medications   Medication Sig Start Date End Date Taking? Authorizing Provider  acetaminophen (TYLENOL) 500 MG tablet Take 1,000 mg by mouth every 6 (six) hours as needed for pain. For pain   Yes Historical Provider, MD  ADVAIR DISKUS 500-50 MCG/DOSE AEPB Inhale 1 puff into the lungs daily.  06/02/16  Yes Historical Provider, MD  albuterol (PROVENTIL HFA;VENTOLIN HFA) 108 (90 Base) MCG/ACT inhaler Inhale 2 puffs into the lungs every 4 (four) hours as needed for wheezing or shortness of breath. 09/08/15  Yes Hayden Rasmussenavid Mabe, NP  amLODipine (NORVASC) 10 MG tablet Take 10 mg by mouth daily.   Yes Historical Provider, MD  glimepiride (AMARYL) 2 MG tablet Take 2 mg by mouth daily before breakfast.   Yes Historical Provider, MD  hydrochlorothiazide (HYDRODIURIL) 25 MG tablet Take 25 mg by mouth daily. 01/05/14  Yes Historical Provider, MD  ipratropium-albuterol (DUONEB) 0.5-2.5 (3) MG/3ML SOLN INHALE 1 VIAL BY NEBULIZATION Q 8 TO 12 H PRN FOR SOB AND WHEEZING 06/02/16  Yes Historical Provider, MD  metFORMIN (GLUCOPHAGE) 850 MG tablet Take 850 mg by mouth 2 (two) times daily with a meal.   Yes Historical Provider, MD  SUBOXONE 4-1 MG FILM Take 1 Film by mouth 2 (two) times daily.  06/05/16  Yes Historical Provider, MD  azithromycin (ZITHROMAX) 250 MG tablet Take 1 tablet (250 mg total) by mouth once. 2 tabs po on  day one, then one tablet po once daily on days 2-5. Patient not taking: Reported on 06/29/2016 09/08/15   Hayden Rasmussenavid Mabe, NP  cephALEXin (KEFLEX) 500 MG capsule Take 1 capsule (500 mg total) by mouth 3 (three) times daily. 06/29/16 07/06/16  Shaune Pollackameron Isaacs, MD  famotidine (PEPCID) 20 MG tablet Take 1 tablet (20 mg total) by mouth 2 (two) times daily. Patient not taking: Reported on 07/01/2016 03/21/14   Charlestine Nighthristopher Lawyer, PA-C  HYDROcodone-acetaminophen (NORCO/VICODIN) 5-325 MG per tablet Take 1 tablet by mouth every 6 (six) hours as needed for moderate pain. Patient not taking: Reported on 07/01/2016  03/21/14   Charlestine Nighthristopher Lawyer, PA-C  HYDROcodone-homatropine [facility](HYCODAN) 5-1.5 MG/5ML syrup Take 5 mLs by mouth every 6 (six) hours as needed for cough. Patient not taking: Reported on 07/01/2016 03/02/14   Francee PiccoloJennifer Piepenbrink, PA-C  naproxen (NAPROSYN) 375 MG tablet Take 1 tablet (375 mg total) by mouth 2 (two) times daily as needed for moderate pain. Patient not taking: Reported on 07/01/2016 06/29/16 07/06/16  Shaune Pollackameron Isaacs, MD  ondansetron (ZOFRAN ODT) 8 MG disintegrating tablet Take 1 tablet (8 mg total) by mouth every 8 (eight) hours as needed for nausea or vomiting. Patient not taking: Reported on 07/01/2016 10/09/15   Lorre NickAnthony Allen, MD  ondansetron [facility](ZOFRAN) 4 MG tablet Take 1 tablet (4 mg total) by mouth every 8 (eight) hours as needed for nausea or vomiting. 06/29/16   Shaune Pollackameron Isaacs, MD  oxyCODONE-acetaminophen (PERCOCET/ROXICET) 5-325 MG tablet Take 1-2 tablets by mouth every 4 (four) hours as needed for severe pain. Patient not taking: Reported on 07/01/2016 10/09/15   Lorre NickAnthony Allen, MD  predniSONE (DELTASONE) 20 MG tablet 3 Tabs PO Days 1-3, then 2 tabs PO Days 4-6, then 1 tab PO Day 7-9, then Half Tab PO Day 10-12 Patient not taking: Reported on 07/01/2016 09/08/15   Hayden Rasmussenavid Mabe, NP  promethazine (PHENERGAN) 25 MG suppository Place 1 suppository (25 mg total) rectally every 6 (six) hours as needed for nausea or vomiting. 06/29/16   Shaune Pollackameron Isaacs, MD  sucralfate (CARAFATE) 1 g tablet Take 1 tablet (1 g total) by mouth 4 (four) times daily. Patient not taking: Reported on 07/01/2016 10/09/15   Lorre NickAnthony Allen, MD    Family History Family History  Problem Relation Age of Onset  . Family history unknown: Yes    Social History Social History  Substance Use Topics  . Smoking status: Current Every Day Smoker    Packs/day: 0.50  . Smokeless tobacco: Never Used  . Alcohol use No     Comment: occassional      Allergies   Darvocet [propoxyphene n-acetaminophen] and Onion   Review of  Systems Review of Systems  10 systems reviewed and found to be negative, except as noted in the HPI.  Physical Exam Updated Vital Signs BP 164/100   Pulse (!) 59   Temp 98.2 F (36.8 C) (Oral)   Resp 20   Ht 5\' 9"  (1.753 m)   Wt 63.5 kg   SpO2 100%   BMI 20.67 kg/m   Physical Exam  Constitutional: He is oriented to person, place, and time. He appears well-developed and well-nourished. No distress.  Appears uncomfortable, writhing  HENT:  Head: Normocephalic and atraumatic.  Mouth/Throat: Oropharynx is clear and moist.  Eyes: Conjunctivae and EOM are normal. Pupils are equal, round, and reactive to light.  Neck: Normal range of motion.  Cardiovascular: Normal rate, regular rhythm and intact distal pulses.   Pulmonary/Chest: Effort normal and breath sounds normal.  Abdominal: Soft. He exhibits no distension and no mass. There is tenderness. There is no rebound and no guarding. No hernia.  Mild, diffuse tenderness to palpation with no guarding or rebound.  Murphy sign negative, no tenderness to palpation over McBurney's point, Rovsings, Psoas and obturator all negative.   Musculoskeletal: Normal range of motion.  Neurological: He is alert and oriented to person, place, and time.  Skin: He is not diaphoretic.  Psychiatric: He has a normal mood and affect.  Nursing note and vitals reviewed.    ED Treatments / Results  Labs (all labs ordered are listed, but only abnormal results are displayed) Labs Reviewed  URINALYSIS, ROUTINE W REFLEX MICROSCOPIC - Abnormal; Notable for the following:       Result Value   Ketones, ur 5 (*)    Protein, ur 30 (*)    Leukocytes, UA TRACE (*)    Squamous Epithelial / LPF 0-5 (*)    All other components within normal limits  COMPREHENSIVE METABOLIC PANEL - Abnormal; Notable for the following:    Glucose, Bld 103 (*)    ALT 14 (*)    All other components within normal limits  CBC WITH DIFFERENTIAL/PLATELET  LIPASE, BLOOD  RAPID URINE  DRUG SCREEN, HOSP PERFORMED  GC/CHLAMYDIA PROBE AMP ([facility]) NOT AT [facility]    EKG  EKG Interpretation None       Radiology Ct Abdomen Pelvis W Contrast  Result Date: 07/01/2016 CLINICAL DATA:  Mid abdominal pain since Thursday. Nausea, vomiting and diarrhea. EXAM: CT ABDOMEN AND PELVIS WITH CONTRAST TECHNIQUE: Multidetector CT imaging of the abdomen and pelvis was performed using the standard protocol following bolus administration of intravenous contrast. CONTRAST:  ISOVUE-300 IOPAMIDOL (ISOVUE-300) INJECTION 61% COMPARISON:  03/21/2014 FINDINGS: Lower chest: Normal size cardiac chambers. Bibasilar dependent atelectasis. No pericardial nor pleural effusion. Hepatobiliary: Negative Pancreas: Negative Spleen: No splenomegaly. Adrenals/Urinary Tract: Normal sized adrenal glands. No obstructive uropathy or enhancing renal mass. Normal size bladder. Stomach/Bowel: No bowel obstruction. Normal bowel rotation. Scattered diverticulosis. Appendix appears unremarkable. Vascular/Lymphatic: Aortic atherosclerosis. No enlarged abdominal or pelvic lymph nodes. Reproductive: Prostate is unremarkable. Other: No abdominal wall hernia or abnormality. No abdominopelvic ascites. Musculoskeletal: Lumbar spinal fusion L2 through L4 with interbody blocks noted at L4-5 and L5-S1. No acute osseous abnormality. Fixation screws noted through both femoral necks. IMPRESSION: No acute intra-abdominal or pelvic abnormality. Lumbar spinal fusion. Aortic atherosclerosis. Electronically Signed   By: Tollie Eth M.D.   On: 07/01/2016 18:12    Procedures Procedures (including critical care time)  Medications Ordered in ED Medications  sodium chloride 0.9 % bolus 2,000 mL (0 mLs Intravenous Stopped 07/01/16 1707)  ondansetron (ZOFRAN) injection 4 mg (4 mg Intravenous Given 07/01/16 1426)  famotidine (PEPCID) IVPB 20 mg premix (0 mg Intravenous Stopped 07/01/16 1510)  morphine 4 MG/ML injection 4 mg (4 mg Intravenous  Given 07/01/16 1510)  iopamidol (ISOVUE-300) 61 % injection (100 mLs  Contrast Given 07/01/16 1718)  oxyCODONE-acetaminophen (PERCOCET/ROXICET) 5-325 MG per tablet 1 tablet (1 tablet Oral Given 07/01/16 1854)  promethazine (PHENERGAN) injection 25 mg (25 mg Intravenous Given 07/01/16 1820)  haloperidol lactate (HALDOL) injection 5 mg (5 mg Intravenous Given 07/01/16 1921)  LORazepam (ATIVAN) injection 1 mg (1 mg Intravenous Given 07/01/16 1922)  diphenhydrAMINE (BENADRYL) injection 25 mg (25 mg Intravenous Given 07/01/16 1921)  sodium chloride 0.9 % bolus 2,000 mL (2,000 mLs Intravenous New Bag/Given 07/01/16 1920)     Initial Impression / Assessment and Plan / ED Course  I have reviewed the triage vital signs and the nursing notes.  Pertinent labs & imaging results that were available during my care of the patient were reviewed by me and considered in my medical decision making (see chart for details).  Clinical Course    Vitals:   07/01/16 1800 07/01/16 1845 07/01/16 1900 07/01/16 1915  BP: 161/85 (!) 150/108 163/100 164/100  Pulse: (!) 53 63 (!) 58 (!) 59  Resp:      Temp:      TempSrc:      SpO2: 98% 99% 100% 100%  Weight:      Height:        Medications  sodium chloride 0.9 % bolus 2,000 mL (0 mLs Intravenous Stopped 07/01/16 1707)  ondansetron (ZOFRAN) injection 4 mg (4 mg Intravenous Given 07/01/16 1426)  famotidine (PEPCID) IVPB 20 mg premix (0 mg Intravenous Stopped 07/01/16 1510)  morphine 4 MG/ML injection 4 mg (4 mg Intravenous Given 07/01/16 1510)  iopamidol (ISOVUE-300) 61 % injection (100 mLs  Contrast Given 07/01/16 1718)  oxyCODONE-acetaminophen (PERCOCET/ROXICET) 5-325 MG per tablet 1 tablet (1 tablet Oral Given 07/01/16 1854)  promethazine (PHENERGAN) injection 25 mg (25 mg Intravenous Given 07/01/16 1820)  haloperidol lactate (HALDOL) injection 5 mg (5 mg Intravenous Given 07/01/16 1921)  LORazepam (ATIVAN) injection 1 mg (1 mg Intravenous Given 07/01/16  1922)  diphenhydrAMINE (BENADRYL) injection 25 mg (25 mg Intravenous Given 07/01/16 1921)  sodium chloride 0.9 % bolus 2,000 mL (2,000 mLs Intravenous New Bag/Given 07/01/16 1920)    [name] is 50 y.o. male presenting with Nausea vomiting diarrhea and urinary frequency. Severe abdominal pain however abdominal exam benign with no focal tenderness. Patient afebrile and overall well appearing. Blood work and urinalysis reassuring, GC chlamydia pending. Patient's pain is persistent, will obtain CT.  CT negative, patient has failed by mouth challenge, will give Haldol, Ativan and Benadryl. Case signed out to PA Geiple at shift change: Plan to attempt by mouth challenge.      Final Clinical Impressions(s) / ED Diagnoses   Final diagnoses:  None    New Prescriptions New Prescriptions   No medications on file     Wynetta Emery, PA-C 07/01/16 2022    Gwyneth Sprout, MD 07/03/16 1907

## 2016-07-01 NOTE — ED Provider Notes (Signed)
8:00 PM Handoff from [place] PA-C at shift change, patient with chronic abd pain, on Suboxone -- presents with vomiting for several days. He has received multiple antiemetics, most recently haldol, ativan, and benadryl. Neg CT abd/pelvis. Currently sleeping. Will need PO challenge when more alert.   11:25 PM Patient has been awake for brief periods, but is still sleeping. He is on pulse ox, normal sat. Will continue to monitor.   BP (!) 163/105   Pulse (!) 58   Temp 98.2 F (36.8 C) (Oral)   Resp 20   Ht 5\' 9"  (1.753 m)   Wt 63.5 kg   SpO2 98%   BMI 20.67 kg/m    1:23 AM Patient awake, alert. He is sipping on water in room, no vomiting. Will d/c with antiemetic. We reviewed labs/imaging results from tonight.   The patient was urged to return to the Emergency Department immediately with worsening of current symptoms, worsening abdominal pain, persistent vomiting, blood noted in stools, fever, or any other concerns. The patient verbalized understanding.   BP (!) 170/102   Pulse (!) 59   Temp 98.2 F (36.8 C) (Oral)   Resp 20   Ht 5\' 9"  (1.753 m)   Wt 63.5 kg   SpO2 98%   BMI 20.67 kg/m     Renne CriglerJoshua Jesenia Spera, PA-C 07/02/16 0124    Benjiman CoreNathan Pickering, MD 07/02/16 [phone]

## 2016-07-01 NOTE — ED Triage Notes (Signed)
Pt comes from home via EMS with c/o N/V/D since Thursday. LBM prior to getting in room, pt reports yellow liquid stool. Pt was seen here, prescribed meds but unable to get them the filled. VSS.

## 2016-07-01 NOTE — ED Notes (Signed)
Pt's phone was ringing as RN brought in the beverage for the PO challenge.  Pt was sleeping, took RN a moment to wake pt.  He spoke to the person on the phone but fell asleep talking to them.  RN notified PA of pt ability to become alert.

## 2016-07-01 NOTE — ED Notes (Signed)
Pt vomited approx 15 seconds after swallowing percocet. PA made aware.

## 2016-07-02 ENCOUNTER — Telehealth: Payer: Self-pay

## 2016-07-02 DIAGNOSIS — R112 Nausea with vomiting, unspecified: Secondary | ICD-10-CM | POA: Diagnosis not present

## 2016-07-02 MED ORDER — PROMETHAZINE HCL 25 MG PO TABS: 25.0000 mg | ORAL_TABLET | Freq: Four times a day (QID) | ORAL | 0 refills | Status: DC | PRN

## 2016-07-02 NOTE — Discharge Instructions (Signed)
Please read and follow all provided instructions.  Your diagnoses today include:  1. Nausea vomiting and diarrhea     Tests performed today include:  Blood counts and electrolytes  Blood tests to check liver and kidney function  Blood tests to check pancreas function  CT scan - no problems seen in abdomen  Vital signs. See below for your results today.   Medications prescribed:   Phenergan (promethazine) - for nausea and vomiting  Take any prescribed medications only as directed.  Home care instructions:   Follow any educational materials contained in this packet.   You should rest for the next several days. Keep drinking plenty of fluids and use the medicine for nausea as directed.    Drink clear liquids for the next 24 hours and introduce solid foods slowly after 24 hours using the b.r.a.t. diet (Bananas, Rice, Applesauce, Toast, Yogurt).    Follow-up instructions: Please follow-up with your primary care provider in the next 2 days for further evaluation of your symptoms. If you are not feeling better in 48 hours you may have a condition that is more serious and you need re-evaluation.   Return instructions:  SEEK IMMEDIATE MEDICAL ATTENTION IF:  If you have pain that does not go away or becomes severe   A temperature above 101F develops   Repeated vomiting occurs (multiple episodes)   If you have pain that becomes localized to portions of the abdomen. The right side could possibly be appendicitis. In an adult, the left lower portion of the abdomen could be colitis or diverticulitis.   Blood is being passed in stools or vomit (bright red or black tarry stools)   You develop chest pain, difficulty breathing, dizziness or fainting, or become confused, poorly responsive, or inconsolable (young children)  If you have any other emergent concerns regarding your health  Additional Information: Abdominal (belly) pain can be caused by many things. Your caregiver  performed an examination and possibly ordered blood/urine tests and imaging (CT scan, x-rays, ultrasound). Many cases can be observed and treated at home after initial evaluation in the emergency department. Even though you are being discharged home, abdominal pain can be unpredictable. Therefore, you need a repeated exam if your pain does not resolve, returns, or worsens. Most patients with abdominal pain don't have to be admitted to the hospital or have surgery, but serious problems like appendicitis and gallbladder attacks can start out as nonspecific pain. Many abdominal conditions cannot be diagnosed in one visit, so follow-up evaluations are very important.  Your vital signs today were: BP (!) 170/102    Pulse (!) 59    Temp 98.2 F (36.8 C) (Oral)    Resp 20    Ht 5\' 9"  (1.753 m)    Wt 63.5 kg    SpO2 98%    BMI 20.67 kg/m  If your blood pressure (bp) was elevated above 135/85 this visit, please have this repeated by your doctor within one month. --------------

## 2016-07-02 NOTE — Telephone Encounter (Signed)
Post ED Visit - Positive Culture Follow-up  Culture report reviewed by antimicrobial stewardship pharmacist:  []  Enzo BiNathan Batchelder, Pharm.D. []  Celedonio MiyamotoJeremy Frens, Pharm.D., BCPS []  Garvin FilaMike Maccia, Pharm.D. []  Georgina PillionElizabeth Martin, Pharm.D., BCPS []  VintonMinh Pham, [address]Ph[address]D., BCPS, AAHIVP []  Estella HuskMichelle Turner, Pharm.D., BCPS, AAHIVP []  Tennis Mustassie Stewart, Pharm.D. []  Sherle Poeob Vincent, [address]Ph[address] Delle Reiningorey Ball Pharm D Positive urine culture Treated with Cephalexin, organism sensitive to the same and no further patient follow-up is required at this time.  Jerry CarasCullom, Savana Spina Burnett 07/02/2016, 9:34 AM

## 2016-07-02 NOTE — ED Notes (Addendum)
Pt alert and talking with RN. Pt attempting to call someone for ride home. EDP notified of pt status.

## 2016-07-04 ENCOUNTER — Encounter (HOSPITAL_COMMUNITY): Payer: Self-pay | Admitting: Emergency Medicine

## 2016-07-04 ENCOUNTER — Emergency Department (HOSPITAL_COMMUNITY)
Admission: EM | Admit: 2016-07-04 | Discharge: 2016-07-04 | Disposition: A | Discharge status: Home / Self Care | Payer: Medicare Other | Attending: Emergency Medicine | Admitting: Emergency Medicine

## 2016-07-04 DIAGNOSIS — F17200 Nicotine dependence, unspecified, uncomplicated: Secondary | ICD-10-CM | POA: Insufficient documentation

## 2016-07-04 DIAGNOSIS — I10 Essential (primary) hypertension: Secondary | ICD-10-CM | POA: Insufficient documentation

## 2016-07-04 DIAGNOSIS — J449 Chronic obstructive pulmonary disease, unspecified: Secondary | ICD-10-CM | POA: Insufficient documentation

## 2016-07-04 DIAGNOSIS — Z79899 Other long term (current) drug therapy: Secondary | ICD-10-CM | POA: Insufficient documentation

## 2016-07-04 DIAGNOSIS — Z7984 Long term (current) use of oral hypoglycemic drugs: Secondary | ICD-10-CM | POA: Diagnosis not present

## 2016-07-04 DIAGNOSIS — R109 Unspecified abdominal pain: Secondary | ICD-10-CM | POA: Diagnosis present

## 2016-07-04 DIAGNOSIS — N12 Tubulo-interstitial nephritis, not specified as acute or chronic: Secondary | ICD-10-CM | POA: Diagnosis not present

## 2016-07-04 DIAGNOSIS — E119 Type 2 diabetes mellitus without complications: Secondary | ICD-10-CM | POA: Insufficient documentation

## 2016-07-04 LAB — URINALYSIS, ROUTINE W REFLEX MICROSCOPIC
Glucose, UA: NEGATIVE mg/dL
Hgb urine dipstick: NEGATIVE
KETONES UR: 5 mg/dL — AB
Nitrite: NEGATIVE
PH: 5 (ref 5.0–8.0)
PROTEIN: 100 mg/dL — AB
Specific Gravity, Urine: 1.036 — ABNORMAL HIGH (ref 1.005–1.030)

## 2016-07-04 LAB — COMPREHENSIVE METABOLIC PANEL
ALK PHOS: 65 U/L (ref 38–126)
ALT: 14 U/L — AB (ref 17–63)
AST: 20 U/L (ref 15–41)
Albumin: 4.3 g/dL (ref 3.5–5.0)
Anion gap: 12 (ref 5–15)
BILIRUBIN TOTAL: 0.9 mg/dL (ref 0.3–1.2)
BUN: 14 mg/dL (ref 6–20)
CALCIUM: 9.5 mg/dL (ref 8.9–10.3)
CO2: 27 mmol/L (ref 22–32)
CREATININE: 1.42 mg/dL — AB (ref 0.61–1.24)
Chloride: 98 mmol/L — ABNORMAL LOW (ref 101–111)
GFR calc Af Amer: >60 mL/min (ref >60)
GFR, EST NON AFRICAN AMERICAN: 56 mL/min — AB (ref >60)
Glucose, Bld: 132 mg/dL — ABNORMAL HIGH (ref 65–99)
Potassium: 3.1 mmol/L — ABNORMAL LOW (ref 3.5–5.1)
Sodium: 137 mmol/L (ref 135–145)
TOTAL PROTEIN: 8.6 g/dL — AB (ref 6.5–8.1)

## 2016-07-04 LAB — CBC
HCT: 44.2 % (ref 39.0–52.0)
Hemoglobin: 15.2 g/dL (ref 13.0–17.0)
MCH: 27.9 pg (ref 26.0–34.0)
MCHC: 34.4 g/dL (ref 30.0–36.0)
MCV: 81.3 fL (ref 78.0–100.0)
PLATELETS: 278 10*3/uL (ref 150–400)
RBC: 5.44 MIL/uL (ref 4.22–5.81)
RDW: 14 % (ref 11.5–15.5)
WBC: 6 10*3/uL (ref 4.0–10.5)

## 2016-07-04 LAB — LIPASE, BLOOD: Lipase: 39 U/L (ref 11–51)

## 2016-07-04 LAB — I-STAT CG4 LACTIC ACID, ED: Lactic Acid, Venous: 1.13 mmol/L (ref 0.5–1.9)

## 2016-07-04 MED ORDER — LEVOFLOXACIN 750 MG PO TABS
750.0000 mg | ORAL_TABLET | Freq: Every day | ORAL | 0 refills | Status: DC
Start: 2016-07-04 — End: 2016-10-30

## 2016-07-04 MED ORDER — DIPHENHYDRAMINE HCL 50 MG/ML IJ SOLN
50.0000 mg | Freq: Once | INTRAMUSCULAR | Status: AC
  Administered 2016-07-04: 50 mg via INTRAVENOUS
  Filled 2016-07-04: qty 1

## 2016-07-04 MED ORDER — LEVOFLOXACIN 750 MG PO TABS
750.0000 mg | ORAL_TABLET | Freq: Once | ORAL | Status: AC
Start: 2016-07-04 — End: 2016-07-04
  Administered 2016-07-04: 750 mg via ORAL
  Filled 2016-07-04: qty 1

## 2016-07-04 MED ORDER — METOCLOPRAMIDE HCL 5 MG/ML IJ SOLN
10.0000 mg | Freq: Once | INTRAMUSCULAR | Status: AC
  Administered 2016-07-04: 10 mg via INTRAVENOUS
  Filled 2016-07-04: qty 2

## 2016-07-04 MED ORDER — METOCLOPRAMIDE HCL 10 MG PO TABS: 10.0000 mg | ORAL_TABLET | Freq: Three times a day (TID) | ORAL | 0 refills | Status: DC | PRN

## 2016-07-04 MED ORDER — LORAZEPAM 2 MG/ML IJ SOLN
1.0000 mg | Freq: Once | INTRAMUSCULAR | Status: AC
  Administered 2016-07-04: 1 mg via INTRAVENOUS
  Filled 2016-07-04: qty 1

## 2016-07-04 NOTE — ED Notes (Signed)
Pt stable to leave and pt states he does not feel comfortable to leave because he is in pain. Pt informed that he can take ibuprofen or tylenol for pain control and pt said he needs something stronger. Pt reeducated and given discharge paperwork. Upon pt getting dressed I knocked and walked in the room to see pt taking medication out of his backpack and swallowing pills. Pt ignores me asking about medication.

## 2016-07-04 NOTE — ED Provider Notes (Signed)
[facility]PT Provider Note   CSN: [id number] Arrival date & time: 07/04/16  0440     History   Chief Complaint Chief Complaint  Patient presents with  . Abdominal Pain    HPI [name] is a 51 y.o. male no sig PMH here with abdominal pain.  He has been here for a 3rd visit for persistent pain and vomiting.  He had a CT scan as well which he states was normal.  He continues to have diffuse cramping with vomiting and diarrhea.  He denies fevers or sick contacts.  States he is urinating normally.  Zofran he was rx is not working. He is requesting symptomatic relief. There are no further complaints.  10 Systems reviewed and are negative for acute change except as noted in the HPI.   HPI  Past Medical History:  Diagnosis Date  . Complication of anesthesia   . COPD (chronic obstructive pulmonary disease) (HCC)    chronic Bronchhititis  . Diabetes mellitus   . Hypertension   . 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  . Pneumonia   . Seizures (HCC)    last one 3 years ago.  Md discontinued meds- Dr Anne Hahn    There are no active problems to display for this patient.   Past Surgical History:  Procedure Laterality Date  . ANTERIOR CERVICAL DECOMP/DISCECTOMY FUSION  12/20/2011   Procedure: ANTERIOR CERVICAL DECOMPRESSION/DISCECTOMY FUSION 2 LEVELS;  Surgeon: Mariam Dollar, MD;  Location: [facility];  Service: Neurosurgery;  Laterality: N/A;  Cervical four-five, five-six anterior cervical decompression/discectomy fusion with plating  . BACK SURGERY     5 from degenerative disease..    . HIP SURGERY     rods to each hip- hip can ot of socket.  . TONSILLECTOMY    . WRIST SURGERY     repair due to crush       Home Medications    Prior to Admission medications   Medication Sig Start Date End Date Taking? Authorizing Provider  acetaminophen (TYLENOL) 500 MG tablet Take 1,000 mg by mouth every 6 (six) hours as needed for pain. For pain   Yes Historical  Provider, MD  ADVAIR DISKUS 500-50 MCG/DOSE AEPB Inhale 1 puff into the lungs daily.  06/02/16  Yes Historical Provider, MD  albuterol (PROVENTIL HFA;VENTOLIN HFA) 108 (90 Base) MCG/ACT inhaler Inhale 2 puffs into the lungs every 4 (four) hours as needed for wheezing or shortness of breath. 09/08/15  Yes Hayden Rasmussen, NP  amLODipine (NORVASC) 10 MG tablet Take 10 mg by mouth daily.   Yes Historical Provider, MD  glimepiride (AMARYL) 2 MG tablet Take 2 mg by mouth daily before breakfast.   Yes Historical Provider, MD  hydrochlorothiazide (HYDRODIURIL) 25 MG tablet Take 25 mg by mouth daily. 01/05/14  Yes Historical Provider, MD  ipratropium-albuterol (DUONEB) 0.5-2.5 (3) MG/3ML SOLN INHALE 1 VIAL BY NEBULIZATION Q 8 TO 12 H PRN FOR SOB AND WHEEZING 06/02/16  Yes Historical Provider, MD  metFORMIN (GLUCOPHAGE) 850 MG tablet Take 850 mg by mouth 2 (two) times daily with a meal.   Yes Historical Provider, MD  ondansetron (ZOFRAN) 4 MG tablet Take 1 tablet (4 mg total) by mouth every 8 (eight) hours as needed for nausea or vomiting. 06/29/16  Yes Shaune Pollack, MD  promethazine (PHENERGAN) 25 MG tablet Take 1 tablet (25 mg total) by mouth every 6 (six) hours as needed for nausea or vomiting. 07/02/16  Yes Renne Crigler, PA-C  SUBOXONE 4-1 MG FILM Take 1 Film by mouth 2 (two) times daily.  06/05/16  Yes Historical Provider, MD  azithromycin (ZITHROMAX) 250 MG tablet Take 1 tablet (250 mg total) by mouth once. 2 tabs po on day one, then one tablet po once daily on days 2-5. Patient not taking: Reported on 07/04/2016 09/08/15   Hayden Rasmussenavid Mabe, NP  cephALEXin (KEFLEX) 500 MG capsule Take 1 capsule (500 mg total) by mouth 3 (three) times daily. 06/29/16 07/06/16  Shaune Pollackameron Isaacs, MD  famotidine (PEPCID) 20 MG tablet Take 1 tablet (20 mg total) by mouth 2 (two) times daily. Patient not taking: Reported on 07/04/2016 03/21/14   Charlestine Nighthristopher Lawyer, PA-C  naproxen (NAPROSYN) 375 MG tablet Take 1 tablet (375 mg total) by mouth 2 (two)  times daily as needed for moderate pain. Patient not taking: Reported on 07/04/2016 06/29/16 07/06/16  Shaune Pollackameron Isaacs, MD  predniSONE (DELTASONE) 20 MG tablet 3 Tabs PO Days 1-3, then 2 tabs PO Days 4-6, then 1 tab PO Day 7-9, then Half Tab PO Day 10-12 Patient not taking: Reported on 07/04/2016 09/08/15   Hayden Rasmussenavid Mabe, NP    Family History Family History  Problem Relation Age of Onset  . Family history unknown: Yes    Social History Social History  Substance Use Topics  . Smoking status: Current Every Day Smoker    Packs/day: 0.50  . Smokeless tobacco: Never Used  . Alcohol use No     Comment: occassional      Allergies   Darvocet [propoxyphene n-acetaminophen] and Onion   Review of Systems Review of Systems   Physical Exam Updated Vital Signs BP (!) 177/119   Pulse 90   Temp 98.9 F (37.2 C) (Oral)   Resp 17   Ht 5\' 9"  (1.753 m)   Wt 140 lb (63.5 kg)   SpO2 100%   BMI 20.67 kg/m   Physical Exam  Constitutional: He is oriented to person, place, and time. Vital signs are normal. He appears well-developed and well-nourished.  Non-toxic appearance. He does not appear ill. No distress.  HENT:  Head: Normocephalic and atraumatic.  Nose: Nose normal.  Mouth/Throat: Oropharynx is clear and moist. No oropharyngeal exudate.  Eyes: Conjunctivae and EOM are normal. Pupils are equal, round, and reactive to light. No scleral icterus.  Neck: Normal range of motion. Neck supple. No tracheal deviation, no edema, no erythema and normal range of motion present. No thyroid mass and no thyromegaly present.  Cardiovascular: Normal rate, regular rhythm, S1 normal, S2 normal, normal heart sounds, intact distal pulses and normal pulses.  Exam reveals no gallop and no friction rub.   No murmur heard. Pulmonary/Chest: Effort normal and breath sounds normal. No respiratory distress. He has no wheezes. He has no rhonchi. He has no rales.  Abdominal: Soft. Normal appearance and bowel sounds are  normal. He exhibits no distension, no ascites and no mass. There is no hepatosplenomegaly. There is no tenderness. There is no rebound, no guarding and no CVA tenderness.  Musculoskeletal: Normal range of motion. He exhibits no edema or tenderness.  Lymphadenopathy:    He has no cervical adenopathy.  Neurological: He is alert and oriented to person, place, and time. He has normal strength. No cranial nerve deficit or sensory deficit.  Skin: Skin is warm, dry and intact. No petechiae and no rash noted. He is not diaphoretic. No erythema. No pallor.  Nursing note and vitals reviewed.    ED Treatments / Results  Labs (all labs  ordered are listed, but only abnormal results are displayed) Labs Reviewed  COMPREHENSIVE METABOLIC PANEL - Abnormal; Notable for the following:       Result Value   Potassium 3.1 (*)    Chloride 98 (*)    Glucose, Bld 132 (*)    Creatinine, Ser 1.42 (*)    Total Protein 8.6 (*)    ALT 14 (*)    GFR calc non Af Amer 56 (*)    All other components within normal limits  URINALYSIS, ROUTINE W REFLEX MICROSCOPIC - Abnormal; Notable for the following:    Color, Urine AMBER (*)    APPearance CLOUDY (*)    Specific Gravity, Urine 1.036 (*)    Bilirubin Urine MODERATE (*)    Ketones, ur 5 (*)    Protein, ur 100 (*)    Leukocytes, UA MODERATE (*)    Bacteria, UA MANY (*)    Squamous Epithelial / LPF 0-5 (*)    All other components within normal limits  LIPASE, BLOOD  CBC  I-STAT CG4 LACTIC ACID, ED    EKG  EKG Interpretation None       Radiology No results found.  Procedures Procedures (including critical care time)  Medications Ordered in ED Medications  levofloxacin (LEVAQUIN) tablet 750 mg (not administered)  LORazepam (ATIVAN) injection 1 mg (1 mg Intravenous Given 07/04/16 0546)  diphenhydrAMINE (BENADRYL) injection 50 mg (50 mg Intravenous Given 07/04/16 0544)  metoCLOPramide (REGLAN) injection 10 mg (10 mg Intravenous Given 07/04/16 0543)      Initial Impression / Assessment and Plan / ED Course  I have reviewed the triage vital signs and the nursing notes.  Pertinent labs & imaging results that were available during my care of the patient were reviewed by me and considered in my medical decision making (see chart for details).  Clinical Course     Patient presents to the ED for abdominal pain for the past 4 days.  He has 2 ED evaluations including a normal CT scan.  Will send lactic acid for possible mesenteric ischemia although this is less likely.  He was given reglan, ativan, and benadryl for nausea.  Labs are pending.  Will continue to reassess.  6:13 AM labs reveal UTI.  Possible pyelonephritis given his history.  Will treat with 5 days of levoquin.  Advised on PCP fu.  Reglan for nausea at home.  He appears well and in NAD. VS remain within his normal limits and he is safe for DC.  Final Clinical Impressions(s) / ED Diagnoses   Final diagnoses:  None    New Prescriptions New Prescriptions   No medications on file     Tomasita Crumble, MD 07/04/16 [phone]

## 2016-07-04 NOTE — ED Triage Notes (Signed)
Per EMS pt from home for 3 days pt has abd pain N,V,D. Pt been [facility] multiple times without relief.

## 2016-07-05 MED FILL — SUBOXONE 4 MG-1 MG SL FILM: 4-1 | 30 days supply | Qty: 60 | Fill #0

## 2016-08-03 MED FILL — SUBOXONE 4 MG-1 MG SL FILM: 4-1 | 30 days supply | Qty: 60 | Fill #0

## 2016-08-30 MED FILL — SUBOXONE 4 MG-1 MG SL FILM: 4-1 | 30 days supply | Qty: 60 | Fill #0

## 2016-09-27 MED FILL — SUBOXONE 4 MG-1 MG SL FILM: 4-1 | 30 days supply | Qty: 60 | Fill #0

## 2016-10-23 ENCOUNTER — Emergency Department (HOSPITAL_COMMUNITY): Payer: Medicare Other

## 2016-10-23 ENCOUNTER — Encounter (HOSPITAL_COMMUNITY): Payer: Self-pay | Admitting: Emergency Medicine

## 2016-10-23 ENCOUNTER — Emergency Department (HOSPITAL_COMMUNITY)
Admission: EM | Admit: 2016-10-23 | Discharge: 2016-10-23 | Disposition: A | Discharge status: Home / Self Care | Payer: Medicare Other | Source: Home / Self Care | Attending: Emergency Medicine | Admitting: Emergency Medicine

## 2016-10-23 DIAGNOSIS — E119 Type 2 diabetes mellitus without complications: Secondary | ICD-10-CM

## 2016-10-23 DIAGNOSIS — K2900 Acute gastritis without bleeding: Secondary | ICD-10-CM

## 2016-10-23 DIAGNOSIS — I10 Essential (primary) hypertension: Secondary | ICD-10-CM | POA: Insufficient documentation

## 2016-10-23 DIAGNOSIS — R109 Unspecified abdominal pain: Secondary | ICD-10-CM

## 2016-10-23 DIAGNOSIS — Z87891 Personal history of nicotine dependence: Secondary | ICD-10-CM | POA: Insufficient documentation

## 2016-10-23 DIAGNOSIS — R1010 Upper abdominal pain, unspecified: Secondary | ICD-10-CM

## 2016-10-23 DIAGNOSIS — J449 Chronic obstructive pulmonary disease, unspecified: Secondary | ICD-10-CM

## 2016-10-23 LAB — URINALYSIS, ROUTINE W REFLEX MICROSCOPIC
BILIRUBIN URINE: NEGATIVE
Bacteria, UA: NONE SEEN
GLUCOSE, UA: NEGATIVE mg/dL
Ketones, ur: NEGATIVE mg/dL
Leukocytes, UA: NEGATIVE
Nitrite: NEGATIVE
PH: 5 (ref 5.0–8.0)
Protein, ur: NEGATIVE mg/dL
SPECIFIC GRAVITY, URINE: 1.016 (ref 1.005–1.030)
SQUAMOUS EPITHELIAL / LPF: NONE SEEN

## 2016-10-23 LAB — COMPREHENSIVE METABOLIC PANEL
ALT: 10 U/L — AB (ref 17–63)
AST: 19 U/L (ref 15–41)
Albumin: 3.9 g/dL (ref 3.5–5.0)
Alkaline Phosphatase: 57 U/L (ref 38–126)
Anion gap: 8 (ref 5–15)
BILIRUBIN TOTAL: 0.3 mg/dL (ref 0.3–1.2)
BUN: 11 mg/dL (ref 6–20)
CO2: 24 mmol/L (ref 22–32)
CREATININE: 1.02 mg/dL (ref 0.61–1.24)
Calcium: 8.9 mg/dL (ref 8.9–10.3)
Chloride: 104 mmol/L (ref 101–111)
GFR calc Af Amer: >60 mL/min (ref >60)
Glucose, Bld: 95 mg/dL (ref 65–99)
Potassium: 3.8 mmol/L (ref 3.5–5.1)
Sodium: 136 mmol/L (ref 135–145)
TOTAL PROTEIN: 7.8 g/dL (ref 6.5–8.1)

## 2016-10-23 LAB — CBC
HEMATOCRIT: 45.7 % (ref 39.0–52.0)
Hemoglobin: 15.2 g/dL (ref 13.0–17.0)
MCH: 27 pg (ref 26.0–34.0)
MCHC: 33.3 g/dL (ref 30.0–36.0)
MCV: 81 fL (ref 78.0–100.0)
Platelets: 240 10*3/uL (ref 150–400)
RBC: 5.64 MIL/uL (ref 4.22–5.81)
RDW: 14.4 % (ref 11.5–15.5)
WBC: 5.1 10*3/uL (ref 4.0–10.5)

## 2016-10-23 LAB — LIPASE, BLOOD: LIPASE: 28 U/L (ref 11–51)

## 2016-10-23 MED ORDER — SODIUM CHLORIDE 0.9 % IV BOLUS (SEPSIS)
1000.0000 mL | Freq: Once | INTRAVENOUS | Status: AC
  Administered 2016-10-23: 1000 mL via INTRAVENOUS

## 2016-10-23 MED ORDER — GI COCKTAIL ~~LOC~~
30.0000 mL | Freq: Once | ORAL | Status: AC
  Administered 2016-10-23: 30 mL via ORAL
  Filled 2016-10-23: qty 30

## 2016-10-23 MED ORDER — HYDROMORPHONE HCL 1 MG/ML IJ SOLN
1.0000 mg | Freq: Once | INTRAMUSCULAR | Status: AC
  Administered 2016-10-23: 1 mg via INTRAVENOUS
  Filled 2016-10-23: qty 1

## 2016-10-23 MED ORDER — HYDROCODONE-ACETAMINOPHEN 5-325 MG PO TABS
2.0000 | ORAL_TABLET | Freq: Once | ORAL | Status: AC
  Administered 2016-10-23: 2 via ORAL
  Filled 2016-10-23: qty 2

## 2016-10-23 MED ORDER — LORAZEPAM 2 MG/ML IJ SOLN
1.0000 mg | Freq: Once | INTRAMUSCULAR | Status: AC
  Administered 2016-10-23: 1 mg via INTRAVENOUS
  Filled 2016-10-23: qty 1

## 2016-10-23 MED ORDER — OMEPRAZOLE 20 MG PO CPDR: 20.0000 mg | DELAYED_RELEASE_CAPSULE | Freq: Every day | ORAL | 0 refills | Status: DC

## 2016-10-23 MED ORDER — SUCRALFATE 1 GM/10ML PO SUSP: 1.0000 g | Freq: Three times a day (TID) | ORAL | 0 refills | Status: DC

## 2016-10-23 MED ORDER — METOCLOPRAMIDE HCL 5 MG/ML IJ SOLN
10.0000 mg | Freq: Once | INTRAMUSCULAR | Status: AC
  Administered 2016-10-23: 10 mg via INTRAVENOUS
  Filled 2016-10-23: qty 2

## 2016-10-23 MED ORDER — HYDRALAZINE HCL 20 MG/ML IJ SOLN
10.0000 mg | Freq: Once | INTRAMUSCULAR | Status: AC
  Administered 2016-10-23: 10 mg via INTRAVENOUS
  Filled 2016-10-23: qty 1

## 2016-10-23 MED ORDER — IOPAMIDOL (ISOVUE-300) INJECTION 61%
INTRAVENOUS | Status: AC
  Administered 2016-10-23: 100 mL
  Filled 2016-10-23: qty 100

## 2016-10-23 MED ORDER — MORPHINE SULFATE (PF) 4 MG/ML IV SOLN
4.0000 mg | Freq: Once | INTRAVENOUS | Status: AC
  Administered 2016-10-23: 4 mg via INTRAVENOUS
  Filled 2016-10-23: qty 1

## 2016-10-23 MED ORDER — RANITIDINE HCL 150 MG PO TABS: 150.0000 mg | ORAL_TABLET | Freq: Two times a day (BID) | ORAL | 0 refills | Status: DC

## 2016-10-23 NOTE — ED Triage Notes (Signed)
Pt transported from home by EMS with c/o lower abd pain +n/v/d onset 24-26 hours.  Constant belching noted.  CBG 86, 170/110

## 2016-10-23 NOTE — ED Provider Notes (Signed)
Assumed care from Dr. Patria Mane at 8 AM. Briefly, the patient is a 51 y.o. male with PMHx of  has a past medical history of Complication of anesthesia; COPD (chronic obstructive pulmonary disease) (HCC); Diabetes mellitus; Hypertension; Lupus erythematosus tumidus; Pneumonia; and Seizures (HCC). here with abdominal pain, nausea, vomiting. Initially pain primarily epigastric, now localizing to RUQ. Lab work is overall reassuring. CBC with normal WBC. Feeling better here with RUQ U/S pending. Plan to likely d/c if RUQ U/S is negative.  Labs Reviewed  COMPREHENSIVE METABOLIC PANEL - Abnormal; Notable for the following:       Result Value   ALT 10 (*)    All other components within normal limits  CBC  LIPASE, BLOOD  URINALYSIS, ROUTINE W REFLEX MICROSCOPIC    Course of Care: -Abdominal pain persistent despite GI cocktail, and pt c/o diffuse, severe abd pain. CT subsequently obtained and is negative. Sx improving with further tx and given reassuring labs, vitals, believe it is reasonable to tx as gastritis and d/c home. UA does show hematuria and he has marked HTn here - he can f/u with PCP for this. No signs of obstructive stone. No evidence of sepsis. Pt tolerating PO after meds and will d/c with continued care at home.  Clinical Impression: 1. Acute superficial gastritis without hemorrhage   2. Upper abdominal pain   3. Recurrent abdominal pain     Disposition: Discharge  Condition: Good  I have discussed the results, Dx and Tx plan with the pt(& family if present). He/she/they expressed understanding and agree(s) with the plan. Discharge instructions discussed at great length. Strict return precautions discussed and pt &/or family have verbalized understanding of the instructions. No further questions at time of discharge.    Discharge Medication List as of 10/23/2016 11:21 AM    START taking these medications   Details  omeprazole (PRILOSEC) 20 MG capsule Take 1 capsule (20 mg total) by  mouth daily., Starting Mon 10/23/2016, Until Mon 11/06/2016, Print    ranitidine (ZANTAC) 150 MG tablet Take 1 tablet (150 mg total) by mouth 2 (two) times daily., Starting Mon 10/23/2016, Until Mon 11/06/2016, Print    sucralfate (CARAFATE) 1 GM/10ML suspension Take 10 mLs (1 g total) by mouth 4 (four) times daily -  with meals and at bedtime., Starting Mon 10/23/2016, Print        Follow Up: Quitman Livings, MD [address] [place] [address] [phone]  In 3 days For repeat blood pressure check  Iva Boop, MD [address] [place] [address] [phone]   Call to set up an appointment with a GI physician in 1-2 weeks for your recurrent, chronic abdominal pain and nausea       Shaune Pollack, MD 10/23/16 1556

## 2016-10-23 NOTE — ED Notes (Signed)
Pt reports blood in his sputum when he spits.

## 2016-10-23 NOTE — ED Provider Notes (Addendum)
[facility] Provider Note   CSN: [id number] Arrival date & time: 10/23/16  0446     History   Chief Complaint Chief Complaint  Patient presents with  . Abdominal Pain    HPI [name] is a 51 y.o. male.  HPI Patient presents to the emergency department with complaints of nausea vomiting diarrhea as well as generalized abdominal discomfort.  No fevers or chills.  No recent sick contacts.  No melena or hematochezia.  No chest pain shortness breath.  No other complaints.  Symptoms are moderate in severity.   Past Medical History:  Diagnosis Date  . Complication of anesthesia   . COPD (chronic obstructive pulmonary disease) (HCC)    chronic Bronchhititis  . Diabetes mellitus   . Hypertension   . 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  . Pneumonia   . Seizures (HCC)    last one 3 years ago.  Md discontinued meds- Dr Anne Hahn    There are no active problems to display for this patient.   Past Surgical History:  Procedure Laterality Date  . ANTERIOR CERVICAL DECOMP/DISCECTOMY FUSION  12/20/2011   Procedure: ANTERIOR CERVICAL DECOMPRESSION/DISCECTOMY FUSION 2 LEVELS;  Surgeon: Mariam Dollar, MD;  Location: [facility];  Service: Neurosurgery;  Laterality: N/A;  Cervical four-five, five-six anterior cervical decompression/discectomy fusion with plating  . BACK SURGERY     5 from degenerative disease..    . HIP SURGERY     rods to each hip- hip can ot of socket.  . TONSILLECTOMY    . WRIST SURGERY     repair due to crush       Home Medications    Prior to Admission medications   Medication Sig Start Date End Date Taking? Authorizing Provider  methocarbamol (ROBAXIN) 500 MG tablet Take 500 mg by mouth every 6 (six) hours as needed for muscle spasms.   Yes Historical Provider, MD  albuterol (PROVENTIL HFA;VENTOLIN HFA) 108 (90 Base) MCG/ACT inhaler Inhale 2 puffs into the lungs every 4 (four) hours as needed for wheezing or shortness of  breath. Patient not taking: Reported on 10/23/2016 09/08/15   Hayden Rasmussen, NP  levofloxacin (LEVAQUIN) 750 MG tablet Take 1 tablet (750 mg total) by mouth daily. X 7 days Patient not taking: Reported on 10/23/2016 07/04/16   Tomasita Crumble, MD  metoCLOPramide (REGLAN) 10 MG tablet Take 1 tablet (10 mg total) by mouth every 8 (eight) hours as needed for nausea or vomiting. Patient not taking: Reported on 10/23/2016 07/04/16   Tomasita Crumble, MD  ondansetron (ZOFRAN) 4 MG tablet Take 1 tablet (4 mg total) by mouth every 8 (eight) hours as needed for nausea or vomiting. Patient not taking: Reported on 10/23/2016 06/29/16   Shaune Pollack, MD  promethazine (PHENERGAN) 25 MG tablet Take 1 tablet (25 mg total) by mouth every 6 (six) hours as needed for nausea or vomiting. Patient not taking: Reported on 10/23/2016 07/02/16   Renne Crigler, PA-C    Family History Family History  Problem Relation Age of Onset  . Family history unknown: Yes    Social History Social History  Substance Use Topics  . Smoking status: Former Smoker    Packs/day: 0.50    Quit date: 09/22/2016  . Smokeless tobacco: Never Used  . Alcohol use No     Comment: occassional      Allergies   Darvocet [propoxyphene n-acetaminophen] and Onion   Review of Systems Review of Systems  All other  systems reviewed and are negative.    Physical Exam Updated Vital Signs BP (!) 156/130   Pulse (!) 52   Temp 98.4 F (36.9 C) (Oral)   Resp 20   Ht  (1.753 m)   Wt 157 lb (71.2 kg)   SpO2 99%   BMI 23.18 kg/m   Physical Exam  Constitutional: He is oriented to person, place, and time. He appears well-developed and well-nourished.  HENT:  Head: Normocephalic and atraumatic.  Eyes: EOM are normal.  Neck: Normal range of motion.  Cardiovascular: Normal rate, regular rhythm, normal heart sounds and intact distal pulses.   Pulmonary/Chest: Effort normal and breath sounds normal. No respiratory distress.  Abdominal: Soft. He  exhibits no distension. There is no tenderness.  Musculoskeletal: Normal range of motion.  Neurological: He is alert and oriented to person, place, and time.  Skin: Skin is warm and dry.  Psychiatric: He has a normal mood and affect. Judgment normal.  Nursing note and vitals reviewed.    ED Treatments / Results  Labs (all labs ordered are listed, but only abnormal results are displayed) Labs Reviewed  COMPREHENSIVE METABOLIC PANEL - Abnormal; Notable for the following:       Result Value   ALT 10 (*)    All other components within normal limits  CBC  LIPASE, BLOOD  URINALYSIS, ROUTINE W REFLEX MICROSCOPIC    EKG  EKG Interpretation None       Radiology No results found.  Procedures Procedures (including critical care time)  Medications Ordered in ED Medications  morphine 4 MG/ML injection 4 mg (not administered)  sodium chloride 0.9 % bolus 1,000 mL (1,000 mLs Intravenous New Bag/Given 10/23/16 0536)  metoCLOPramide (REGLAN) injection 10 mg (10 mg Intravenous Given 10/23/16 0536)  LORazepam (ATIVAN) injection 1 mg (1 mg Intravenous Given 10/23/16 0538)     Initial Impression / Assessment and Plan / ED Course  I have reviewed the triage vital signs and the nursing notes.  Pertinent labs & imaging results that were available during my care of the patient were reviewed by me and considered in my medical decision making (see chart for details).     Patient is overall well-appearing. Still with RUQ tenderness at this time. No vomiting while in ER. Vitals stable. Attempted bedside US but unable to obtain quality views. Will be sent for formal US  Care to Dr Penne Lash to follow up on [place].   Final Clinical Impressions(s) / ED Diagnoses   Final diagnoses:  Upper abdominal pain    New Prescriptions New Prescriptions   No medications on file     Azalia Bilis, MD 10/23/16 1610    Azalia Bilis, MD 10/23/16 [phone]

## 2016-10-23 NOTE — ED Notes (Signed)
Pt taken to CT.

## 2016-10-23 NOTE — ED Notes (Signed)
MD Isaacs at the bedside  

## 2016-10-23 NOTE — ED Notes (Signed)
Pt questioning pain medication, attempts made to explain medication he is being given will assist in resolving current symptoms

## 2016-10-23 NOTE — ED Notes (Signed)
Pt returned from CT °

## 2016-10-23 NOTE — ED Triage Notes (Signed)
Pt reports he has not followed up with GI regarding n/v/d episodes.  Pt noted to be spitting into emesis bag.

## 2016-10-23 NOTE — Discharge Instructions (Signed)
You were seen and treated in the emergency department for abdominal pain. Fortunately, your lab work and/or imaging was reassuring and we can treat your pain symptomatically.   At this time, there does not appear to be an acute, emergent cause for your abdominal pain. However, this does not mean that your abdominal pain may not become an emergency in the future. It is VERY important that you monitor your symptoms and return to the Emergency Department if you develop worsening or unrelenting pain, worsening nausea and/or vomiting that makes you unable to hold fluid or food down, new fever, or any other concerning symptoms. We encourage you to return with any questions or concerns.  Please read this information carefully. Thank you for the opportunity to care for you today.

## 2016-10-23 NOTE — ED Notes (Signed)
Patient transported to Ultrasound 

## 2016-10-23 NOTE — ED Notes (Signed)
No further belching, emesis or diarrhea noted.  pt continue to c/o pain, nausea

## 2016-10-24 ENCOUNTER — Inpatient Hospital Stay (HOSPITAL_COMMUNITY)
Admission: EM | Admit: 2016-10-24 | Discharge: 2016-10-30 | DRG: 918 | Disposition: A | Discharge status: Home / Self Care | Payer: Medicare Other | Attending: Internal Medicine | Admitting: Internal Medicine

## 2016-10-24 ENCOUNTER — Emergency Department (HOSPITAL_COMMUNITY)
Admission: EM | Admit: 2016-10-24 | Discharge: 2016-10-24 | Disposition: A | Discharge status: Home / Self Care | Payer: Medicare Other | Source: Home / Self Care | Attending: Emergency Medicine | Admitting: Emergency Medicine

## 2016-10-24 ENCOUNTER — Encounter (HOSPITAL_COMMUNITY): Payer: Self-pay | Admitting: *Deleted

## 2016-10-24 ENCOUNTER — Encounter (HOSPITAL_COMMUNITY): Payer: Self-pay

## 2016-10-24 DIAGNOSIS — T604X1A Toxic effect of rodenticides, accidental (unintentional), initial encounter: Secondary | ICD-10-CM | POA: Diagnosis not present

## 2016-10-24 DIAGNOSIS — R319 Hematuria, unspecified: Secondary | ICD-10-CM

## 2016-10-24 DIAGNOSIS — Z833 Family history of diabetes mellitus: Secondary | ICD-10-CM

## 2016-10-24 DIAGNOSIS — Z87891 Personal history of nicotine dependence: Secondary | ICD-10-CM

## 2016-10-24 DIAGNOSIS — R109 Unspecified abdominal pain: Principal | ICD-10-CM

## 2016-10-24 DIAGNOSIS — D688 Other specified coagulation defects: Secondary | ICD-10-CM | POA: Diagnosis present

## 2016-10-24 DIAGNOSIS — G8929 Other chronic pain: Secondary | ICD-10-CM | POA: Diagnosis present

## 2016-10-24 DIAGNOSIS — Z981 Arthrodesis status: Secondary | ICD-10-CM

## 2016-10-24 DIAGNOSIS — T604X1D Toxic effect of rodenticides, accidental (unintentional), subsequent encounter: Secondary | ICD-10-CM

## 2016-10-24 DIAGNOSIS — D689 Coagulation defect, unspecified: Secondary | ICD-10-CM | POA: Diagnosis not present

## 2016-10-24 DIAGNOSIS — R791 Abnormal coagulation profile: Secondary | ICD-10-CM

## 2016-10-24 DIAGNOSIS — J449 Chronic obstructive pulmonary disease, unspecified: Secondary | ICD-10-CM

## 2016-10-24 DIAGNOSIS — E440 Moderate protein-calorie malnutrition: Secondary | ICD-10-CM | POA: Diagnosis present

## 2016-10-24 DIAGNOSIS — K920 Hematemesis: Secondary | ICD-10-CM | POA: Diagnosis present

## 2016-10-24 DIAGNOSIS — K922 Gastrointestinal hemorrhage, unspecified: Secondary | ICD-10-CM | POA: Diagnosis present

## 2016-10-24 DIAGNOSIS — I10 Essential (primary) hypertension: Secondary | ICD-10-CM

## 2016-10-24 DIAGNOSIS — K760 Fatty (change of) liver, not elsewhere classified: Secondary | ICD-10-CM | POA: Diagnosis present

## 2016-10-24 DIAGNOSIS — S8010XA Contusion of unspecified lower leg, initial encounter: Secondary | ICD-10-CM | POA: Diagnosis present

## 2016-10-24 DIAGNOSIS — N390 Urinary tract infection, site not specified: Secondary | ICD-10-CM | POA: Diagnosis present

## 2016-10-24 DIAGNOSIS — Z91018 Allergy to other foods: Secondary | ICD-10-CM

## 2016-10-24 DIAGNOSIS — E119 Type 2 diabetes mellitus without complications: Secondary | ICD-10-CM

## 2016-10-24 DIAGNOSIS — Z79899 Other long term (current) drug therapy: Secondary | ICD-10-CM

## 2016-10-24 DIAGNOSIS — Z6821 Body mass index (BMI) 21.0-21.9, adult: Secondary | ICD-10-CM

## 2016-10-24 DIAGNOSIS — R310 Gross hematuria: Secondary | ICD-10-CM | POA: Diagnosis present

## 2016-10-24 DIAGNOSIS — Z885 Allergy status to narcotic agent status: Secondary | ICD-10-CM

## 2016-10-24 DIAGNOSIS — S301XXA Contusion of abdominal wall, initial encounter: Secondary | ICD-10-CM | POA: Diagnosis present

## 2016-10-24 LAB — URINALYSIS, ROUTINE W REFLEX MICROSCOPIC
Bacteria, UA: NONE SEEN
Bilirubin Urine: NEGATIVE
GLUCOSE, UA: 50 mg/dL — AB
KETONES UR: 5 mg/dL — AB
LEUKOCYTES UA: NEGATIVE
NITRITE: POSITIVE — AB
PH: 5 (ref 5.0–8.0)
PROTEIN: 100 mg/dL — AB
Specific Gravity, Urine: 1.019 (ref 1.005–1.030)
Squamous Epithelial / LPF: NONE SEEN

## 2016-10-24 LAB — COMPREHENSIVE METABOLIC PANEL
ALT: 11 U/L — ABNORMAL LOW (ref 17–63)
AST: 18 U/L (ref 15–41)
Albumin: 4.1 g/dL (ref 3.5–5.0)
Alkaline Phosphatase: 54 U/L (ref 38–126)
Anion gap: 8 (ref 5–15)
BUN: 12 mg/dL (ref 6–20)
CHLORIDE: 104 mmol/L (ref 101–111)
CO2: 24 mmol/L (ref 22–32)
Calcium: 8.9 mg/dL (ref 8.9–10.3)
Creatinine, Ser: 0.92 mg/dL (ref 0.61–1.24)
Glucose, Bld: 98 mg/dL (ref 65–99)
POTASSIUM: 3.6 mmol/L (ref 3.5–5.1)
SODIUM: 136 mmol/L (ref 135–145)
Total Bilirubin: 0.4 mg/dL (ref 0.3–1.2)
Total Protein: 8 g/dL (ref 6.5–8.1)

## 2016-10-24 LAB — COMPREHENSIVE METABOLIC PANEL WITH GFR
ALT: 12 U/L — ABNORMAL LOW (ref 17–63)
AST: 19 U/L (ref 15–41)
Albumin: 4.4 g/dL (ref 3.5–5.0)
Alkaline Phosphatase: 58 U/L (ref 38–126)
Anion gap: 11 (ref 5–15)
BUN: 9 mg/dL (ref 6–20)
CO2: 24 mmol/L (ref 22–32)
Calcium: 9.1 mg/dL (ref 8.9–10.3)
Chloride: 101 mmol/L (ref 101–111)
Creatinine, Ser: 0.97 mg/dL (ref 0.61–1.24)
GFR calc Af Amer: >60 mL/min
GFR calc non Af Amer: >60 mL/min
Glucose, Bld: 102 mg/dL — ABNORMAL HIGH (ref 65–99)
Potassium: 3.7 mmol/L (ref 3.5–5.1)
Sodium: 136 mmol/L (ref 135–145)
Total Bilirubin: 0.5 mg/dL (ref 0.3–1.2)
Total Protein: 8.7 g/dL — ABNORMAL HIGH (ref 6.5–8.1)

## 2016-10-24 LAB — ACETAMINOPHEN LEVEL: Acetaminophen (Tylenol), Serum: <10 ug/mL — ABNORMAL LOW (ref 10–30)

## 2016-10-24 LAB — DIC (DISSEMINATED INTRAVASCULAR COAGULATION)PANEL
Fibrinogen: 419 mg/dL (ref 210–475)
Smear Review: NONE SEEN

## 2016-10-24 LAB — CBC
HCT: 44 % (ref 39.0–52.0)
HEMATOCRIT: 44.7 % (ref 39.0–52.0)
Hemoglobin: 15 g/dL (ref 13.0–17.0)
Hemoglobin: 15.3 g/dL (ref 13.0–17.0)
MCH: 27.6 pg (ref 26.0–34.0)
MCH: 26.8 pg (ref 26.0–34.0)
MCHC: 34.1 g/dL (ref 30.0–36.0)
MCHC: 34.2 g/dL (ref 30.0–36.0)
MCV: 80.9 fL (ref 78.0–100.0)
MCV: 78.3 fL (ref 78.0–100.0)
Platelets: 209 10*3/uL (ref 150–400)
Platelets: 213 10*3/uL (ref 150–400)
RBC: 5.44 MIL/uL (ref 4.22–5.81)
RBC: 5.71 MIL/uL (ref 4.22–5.81)
RDW: 14.3 % (ref 11.5–15.5)
RDW: 14.1 % (ref 11.5–15.5)
WBC: 5.3 10*3/uL (ref 4.0–10.5)
WBC: 4.4 10*3/uL (ref 4.0–10.5)

## 2016-10-24 LAB — SALICYLATE LEVEL: Salicylate Lvl: <7 mg/dL (ref 2.8–30.0)

## 2016-10-24 LAB — DIC (DISSEMINATED INTRAVASCULAR COAGULATION) PANEL
APTT: 147 s — AB (ref 24–36)
PLATELETS: 213 10*3/uL (ref 150–400)

## 2016-10-24 LAB — RAPID URINE DRUG SCREEN, HOSP PERFORMED
AMPHETAMINES: NOT DETECTED
BENZODIAZEPINES: POSITIVE — AB
Barbiturates: POSITIVE — AB
Cocaine: NOT DETECTED
OPIATES: POSITIVE — AB
TETRAHYDROCANNABINOL: POSITIVE — AB

## 2016-10-24 LAB — LIPASE, BLOOD
LIPASE: 33 U/L (ref 11–51)
Lipase: 63 U/L — ABNORMAL HIGH (ref 11–51)

## 2016-10-24 LAB — CK: Total CK: 179 U/L (ref 49–397)

## 2016-10-24 LAB — TYPE AND SCREEN
ABO/RH(D): O POS
Antibody Screen: NEGATIVE

## 2016-10-24 LAB — POC OCCULT BLOOD, ED: Fecal Occult Bld: NEGATIVE

## 2016-10-24 MED ORDER — METOCLOPRAMIDE HCL 10 MG PO TABS: 10.0000 mg | ORAL_TABLET | Freq: Once | ORAL | Status: DC

## 2016-10-24 MED ORDER — VITAMIN K1 10 MG/ML IJ SOLN
10.0000 mg | Freq: Once | INTRAVENOUS | Status: AC
  Administered 2016-10-24: 10 mg via INTRAVENOUS
  Filled 2016-10-24: qty 1

## 2016-10-24 MED ORDER — DIPHENHYDRAMINE HCL 50 MG/ML IJ SOLN
50.0000 mg | Freq: Once | INTRAMUSCULAR | Status: AC
  Administered 2016-10-24: 50 mg via INTRAVENOUS
  Filled 2016-10-24: qty 1

## 2016-10-24 MED ORDER — MORPHINE SULFATE (PF) 4 MG/ML IV SOLN
4.0000 mg | Freq: Once | INTRAVENOUS | Status: AC
  Administered 2016-10-24: 4 mg via INTRAVENOUS
  Filled 2016-10-24: qty 1

## 2016-10-24 MED ORDER — ONDANSETRON HCL 4 MG/2ML IJ SOLN
4.0000 mg | Freq: Once | INTRAMUSCULAR | Status: AC
  Administered 2016-10-24: 4 mg via INTRAVENOUS
  Filled 2016-10-24: qty 2

## 2016-10-24 MED ORDER — METOCLOPRAMIDE HCL 10 MG PO TABS: 10.0000 mg | ORAL_TABLET | Freq: Three times a day (TID) | ORAL | 0 refills | Status: DC | PRN

## 2016-10-24 MED ORDER — METOCLOPRAMIDE HCL 5 MG/ML IJ SOLN
10.0000 mg | Freq: Once | INTRAMUSCULAR | Status: AC
  Administered 2016-10-24: 10 mg via INTRAVENOUS
  Filled 2016-10-24: qty 2

## 2016-10-24 MED ORDER — ONDANSETRON 4 MG PO TBDP: 4.0000 mg | ORAL_TABLET | Freq: Once | ORAL | Status: DC | PRN

## 2016-10-24 MED ORDER — LORAZEPAM 2 MG/ML IJ SOLN
1.0000 mg | Freq: Once | INTRAMUSCULAR | Status: AC
Start: 2016-10-24 — End: 2016-10-24
  Administered 2016-10-24: 1 mg via INTRAVENOUS
  Filled 2016-10-24: qty 1

## 2016-10-24 MED ORDER — SUCRALFATE 1 GM/10ML PO SUSP
1.0000 g | Freq: Once | ORAL | Status: AC
  Administered 2016-10-24: 1 g via ORAL
  Filled 2016-10-24: qty 10

## 2016-10-24 NOTE — ED Notes (Signed)
Pt found on floor in waiting room bathroom, states he lost consciousness.

## 2016-10-24 NOTE — ED Notes (Signed)
Pt states that he has not taken his blood pressure medication and that is why his blood pressure is hypertensive.

## 2016-10-24 NOTE — ED Triage Notes (Addendum)
Per EMS, pt from home, pt was just d/c this am at around 0300 with GI bleed and was given referral to see a GI MD and claims that he did not have good care.  Returned to see another EDP and c/o mouth sores.  Pt reports hematuria which started after he was discharged this am.  Pt states he has woke up with blood all in his bed.  c/o lesions in his mouth that are bleeding-they appear to be blood-filled blisters in his mouth.  Pt reports feeling weak, and has not had anything to eat for 4 days.

## 2016-10-24 NOTE — ED Notes (Signed)
Per EMS- Pt c/o n/v/d for three days. [facility] for same- dx with possible ulcer. Can't hold meds down, keeps throwing up.

## 2016-10-24 NOTE — ED Notes (Signed)
Bed: WA03 Expected date:  Expected time:  Means of arrival:  Comments: 

## 2016-10-24 NOTE — ED Triage Notes (Signed)
Pt ambulated out of room to ask when he would be seen, fully oriented.

## 2016-10-24 NOTE — ED Provider Notes (Signed)
[facility]-EMERGENCY DEPT Provider Note   CSN: [id number] Arrival date & time: 10/24/16  0205     History   Chief Complaint Chief Complaint  Patient presents with  . Emesis  . Abdominal Pain    HPI [name] is a 51 y.o. male.With a past medical history of chronic abdominal pain presenting today with the same. He states that his pain has been going on for the past couple of days. He cannot keep anything down and continues to vomit. He was seen here yesterday but was not completely treated. He states that he was told he has an ulcer. This has never been diagnosed with an EGD. He denies having a consistent gastroenterologist from which she gets care. He states he was okay for several months and now that his symptoms are back. He denies any fevers or diarrhea. He's had no sick contacts. He states his urine has been dark and his emesis has been dark as well. There are no further complaints.  10 Systems reviewed and are negative for acute change except as noted in the HPI.   HPI  Past Medical History:  Diagnosis Date  . Complication of anesthesia   . COPD (chronic obstructive pulmonary disease) (HCC)    chronic Bronchhititis  . Diabetes mellitus   . Hypertension   . 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  . Pneumonia   . Seizures (HCC)    last one 3 years ago.  Md discontinued meds- Dr Anne Hahn    There are no active problems to display for this patient.   Past Surgical History:  Procedure Laterality Date  . ANTERIOR CERVICAL DECOMP/DISCECTOMY FUSION  12/20/2011   Procedure: ANTERIOR CERVICAL DECOMPRESSION/DISCECTOMY FUSION 2 LEVELS;  Surgeon: Mariam Dollar, MD;  Location: [facility];  Service: Neurosurgery;  Laterality: N/A;  Cervical four-five, five-six anterior cervical decompression/discectomy fusion with plating  . BACK SURGERY     5 from degenerative disease..    . HIP SURGERY     rods to each hip- hip can ot of socket.  . TONSILLECTOMY    . WRIST SURGERY       repair due to crush       Home Medications    Prior to Admission medications   Medication Sig Start Date End Date Taking? Authorizing Provider  albuterol (PROVENTIL HFA;VENTOLIN HFA) 108 (90 Base) MCG/ACT inhaler Inhale 2 puffs into the lungs every 4 (four) hours as needed for wheezing or shortness of breath. Patient not taking: Reported on 10/23/2016 09/08/15   Hayden Rasmussen, NP  levofloxacin (LEVAQUIN) 750 MG tablet Take 1 tablet (750 mg total) by mouth daily. X 7 days Patient not taking: Reported on 10/23/2016 07/04/16   Tomasita Crumble, MD  methocarbamol (ROBAXIN) 500 MG tablet Take 500 mg by mouth every 6 (six) hours as needed for muscle spasms.    Historical Provider, MD  metoCLOPramide (REGLAN) 10 MG tablet Take 1 tablet (10 mg total) by mouth every 8 (eight) hours as needed for nausea or vomiting. Patient not taking: Reported on 10/23/2016 07/04/16   Tomasita Crumble, MD  omeprazole (PRILOSEC) 20 MG capsule Take 1 capsule (20 mg total) by mouth daily. 10/23/16 11/06/16  Shaune Pollack, MD  ondansetron (ZOFRAN) 4 MG tablet Take 1 tablet (4 mg total) by mouth every 8 (eight) hours as needed for nausea or vomiting. Patient not taking: Reported on 10/23/2016 06/29/16   Shaune Pollack, MD  promethazine (PHENERGAN) 25 MG tablet Take 1 tablet (25  mg total) by mouth every 6 (six) hours as needed for nausea or vomiting. Patient not taking: Reported on 10/23/2016 07/02/16   Renne Crigler, PA-C  ranitidine (ZANTAC) 150 MG tablet Take 1 tablet (150 mg total) by mouth 2 (two) times daily. 10/23/16 11/06/16  Shaune Pollack, MD  sucralfate (CARAFATE) 1 GM/10ML suspension Take 10 mLs (1 g total) by mouth 4 (four) times daily -  with meals and at bedtime. 10/23/16   Shaune Pollack, MD    Family History Family History  Problem Relation Age of Onset  . Family history unknown: Yes    Social History Social History  Substance Use Topics  . Smoking status: Former Smoker    Packs/day: 0.50    Quit date: 09/22/2016  .  Smokeless tobacco: Never Used  . Alcohol use No     Comment: occassional      Allergies   Darvocet [propoxyphene n-acetaminophen] and Onion   Review of Systems Review of Systems   Physical Exam Updated Vital Signs BP (!) 157/105 (BP Location: Left Arm)   Pulse 64   Temp 97.8 F (36.6 C) (Oral)   Resp 20   SpO2 98%   Physical Exam  Constitutional: He is oriented to person, place, and time. Vital signs are normal. He appears well-developed and well-nourished.  Non-toxic appearance. He does not appear ill. No distress.  HENT:  Head: Normocephalic and atraumatic.  Nose: Nose normal.  Mouth/Throat: Oropharynx is clear and moist. No oropharyngeal exudate.  Eyes: Conjunctivae and EOM are normal. Pupils are equal, round, and reactive to light. No scleral icterus.  Neck: Normal range of motion. Neck supple. No tracheal deviation, no edema, no erythema and normal range of motion present. No thyroid mass and no thyromegaly present.  Cardiovascular: Normal rate, regular rhythm, S1 normal, S2 normal, normal heart sounds, intact distal pulses and normal pulses.  Exam reveals no gallop and no friction rub.   No murmur heard. Pulmonary/Chest: Effort normal and breath sounds normal. No respiratory distress. He has no wheezes. He has no rhonchi. He has no rales.  Abdominal: Soft. Normal appearance and bowel sounds are normal. He exhibits no distension, no ascites and no mass. There is no hepatosplenomegaly. There is no tenderness. There is no rebound, no guarding and no CVA tenderness.  Musculoskeletal: Normal range of motion. He exhibits no edema or tenderness.  Lymphadenopathy:    He has no cervical adenopathy.  Neurological: He is alert and oriented to person, place, and time. He has normal strength. No cranial nerve deficit or sensory deficit.  Skin: Skin is warm, dry and intact. No petechiae and no rash noted. He is not diaphoretic. No erythema. No pallor.  Nursing note and vitals  reviewed.    ED Treatments / Results  Labs (all labs ordered are listed, but only abnormal results are displayed) Labs Reviewed  COMPREHENSIVE METABOLIC PANEL - Abnormal; Notable for the following:       Result Value   ALT 11 (*)    All other components within normal limits  LIPASE, BLOOD  CBC    EKG  EKG Interpretation None       Radiology Ct Abdomen Pelvis W Contrast  Result Date: 10/23/2016 CLINICAL DATA:  Nausea and abdominal pain EXAM: CT ABDOMEN AND PELVIS WITH CONTRAST TECHNIQUE: Multidetector CT imaging of the abdomen and pelvis was performed using the standard protocol following bolus administration of intravenous contrast. CONTRAST:  100 mL ISOVUE-300 IOPAMIDOL (ISOVUE-300) INJECTION 61% COMPARISON:  Ultrasound from earlier in  the same day, 07/01/2016 FINDINGS: Lower chest: Minimal dependent atelectatic changes are noted. Hepatobiliary: The liver is diffusely decreased in attenuation consistent fatty infiltration. The gallbladder is well distended without focal abnormality. Pancreas: Unremarkable. No pancreatic ductal dilatation or surrounding inflammatory changes. Spleen: Spleen is within normal limits. Adrenals/Urinary Tract: Adrenal glands are unremarkable. Kidneys are normal, without renal calculi, focal lesion, or hydronephrosis. Bladder is decompressed. Stomach/Bowel: Scattered diverticular changes noted. No findings to suggest acute no diverticulitis is seen. Vascular/Lymphatic: Aortic atherosclerosis. No enlarged abdominal or pelvic lymph nodes. Reproductive: Prostate is unremarkable. Other: No abdominal wall hernia or abnormality. No abdominopelvic ascites. Musculoskeletal: Postsurgical changes are noted in the lumbar spine and bilateral proximal femurs. No acute abnormality is seen. IMPRESSION: Chronic changes as described above stable from the previous exam. No acute abnormality noted. Electronically Signed   By: Alcide Clever M.D.   On: 10/23/2016 11:14   US Abdomen  Limited Ruq  Result Date: 10/23/2016 CLINICAL DATA:  Abdominal pain with nausea and vomiting pain in pain History of systemic lupus erythematosus EXAM: US ABDOMEN LIMITED - RIGHT UPPER QUADRANT COMPARISON:  CT abdomen and pelvis July 01, 2016 FINDINGS: Gallbladder: No gallstones or wall thickening visualized. There is no pericholecystic fluid. No sonographic Murphy sign noted by sonographer. Common bile duct: Diameter: 3 mm. No intrahepatic or extrahepatic biliary duct dilatation. Liver: No focal lesion identified. Within normal limits in parenchymal echogenicity. Right kidney is noted to be somewhat echogenic. IMPRESSION: Somewhat echogenic right kidney. This is a finding that may be seen with medical renal disease. Appropriate laboratory correlation advised. Study otherwise unremarkable. Electronically Signed   By: Bretta Bang III M.D.   On: 10/23/2016 08:31    Procedures Procedures (including critical care time)  Medications Ordered in ED Medications  ondansetron (ZOFRAN-ODT) disintegrating tablet 4 mg (not administered)  sucralfate (CARAFATE) 1 GM/10ML suspension 1 g (not administered)  diphenhydrAMINE (BENADRYL) injection 50 mg (not administered)  LORazepam (ATIVAN) injection 1 mg (not administered)  metoCLOPramide (REGLAN) injection 10 mg (not administered)     Initial Impression / Assessment and Plan / ED Course  I have reviewed the triage vital signs and the nursing notes.  Pertinent labs & imaging results that were available during my care of the patient were reviewed by me and considered in my medical decision making (see chart for details).      Patient presents emergency department for nausea and vomiting. He has had multiple evaluations for this. Yesterday he had an ultrasound and a CT scan which were normal. I do not believe need to repeat any imaging. He was given sucralfate, Reglan, Benadryl, Ativan for his nausea and vomiting. We'll continue to closely reevaluate.  lab studies are unremarkable as they were yesterday.  He feels better after medications. No furtyher emesis.  Patient safe for DC with normal VS.    Final Clinical Impressions(s) / ED Diagnoses   Final diagnoses:  None    New Prescriptions New Prescriptions   No medications on file     Tomasita Crumble, MD 10/24/16 [phone]

## 2016-10-24 NOTE — ED Provider Notes (Signed)
[facility]ERGENCY DEPT Provider Note   CSN: [id number] Arrival date & time: 10/24/16  1816     History   Chief Complaint Chief Complaint  Patient presents with  . Hematemesis  . Mouth sores    HPI [name] is a 51 y.o. male.  51 yo M with a chief complaint of bleeding from his gums his urine in his stool. This is new since he was seen just this morning for abdominal pain. This is his third visit in 3 days. Still feels like he is unable to keep anything down. Diffuse abdominal pain is crampy. Denies fevers since had some subjective chills. Has a remote history of seizures that does not think that he had one. Feels that he has been shaking a little bit.   The history is provided by the patient.  Illness  This is a recurrent problem. The current episode started 2 days ago. The problem occurs constantly. The problem has not changed since onset.Associated symptoms include abdominal pain. Pertinent negatives include no chest pain, no headaches and no shortness of breath. Nothing aggravates the symptoms. Nothing relieves the symptoms. He has tried nothing for the symptoms. The treatment provided no relief.    Past Medical History:  Diagnosis Date  . Complication of anesthesia   . COPD (chronic obstructive pulmonary disease) (HCC)    chronic Bronchhititis  . Diabetes mellitus   . Hypertension   . 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  . Pneumonia   . Seizures (HCC)    last one 3 years ago.  Md discontinued meds- Dr Anne Hahn    Patient Active Problem List   Diagnosis Date Noted  . Hematemesis 10/24/2016    Past Surgical History:  Procedure Laterality Date  . ANTERIOR CERVICAL DECOMP/DISCECTOMY FUSION  12/20/2011   Procedure: ANTERIOR CERVICAL DECOMPRESSION/DISCECTOMY FUSION 2 LEVELS;  Surgeon: Mariam Dollar, MD;  Location: [facility];  Service: Neurosurgery;  Laterality: N/A;  Cervical four-five, five-six anterior cervical decompression/discectomy fusion with  plating  . BACK SURGERY     5 from degenerative disease..    . HIP SURGERY     rods to each hip- hip can ot of socket.  . TONSILLECTOMY    . WRIST SURGERY     repair due to crush       Home Medications    Prior to Admission medications   Medication Sig Start Date End Date Taking? Authorizing Provider  albuterol (PROVENTIL HFA;VENTOLIN HFA) 108 (90 Base) MCG/ACT inhaler Inhale 2 puffs into the lungs every 6 (six) hours as needed for wheezing or shortness of breath.   Yes Historical Provider, MD  Fluticasone-Salmeterol (ADVAIR) 250-50 MCG/DOSE AEPB Inhale 1 puff into the lungs 2 (two) times daily.   Yes Historical Provider, MD  omeprazole (PRILOSEC) 20 MG capsule Take 1 capsule (20 mg total) by mouth daily. 10/23/16 11/06/16 Yes Shaune Pollack, MD  ranitidine (ZANTAC) 150 MG tablet Take 1 tablet (150 mg total) by mouth 2 (two) times daily. 10/23/16 11/06/16 Yes Shaune Pollack, MD  sucralfate (CARAFATE) 1 GM/10ML suspension Take 10 mLs (1 g total) by mouth 4 (four) times daily -  with meals and at bedtime. 10/23/16  Yes Shaune Pollack, MD  albuterol (PROVENTIL HFA;VENTOLIN HFA) 108 (90 Base) MCG/ACT inhaler Inhale 2 puffs into the lungs every 4 (four) hours as needed for wheezing or shortness of breath. Patient not taking: Reported on 10/23/2016 09/08/15   Hayden Rasmussen, NP  levofloxacin (LEVAQUIN) 750 MG tablet Take  1 tablet (750 mg total) by mouth daily. X 7 days Patient not taking: Reported on 10/23/2016 07/04/16   Tomasita Crumble, MD  metoCLOPramide (REGLAN) 10 MG tablet Take 1 tablet (10 mg total) by mouth every 8 (eight) hours as needed for nausea (nausea/headache). Patient not taking: Reported on 10/24/2016 10/24/16   Tomasita Crumble, MD  ondansetron (ZOFRAN) 4 MG tablet Take 1 tablet (4 mg total) by mouth every 8 (eight) hours as needed for nausea or vomiting. Patient not taking: Reported on 10/23/2016 06/29/16   Shaune Pollack, MD  promethazine (PHENERGAN) 25 MG tablet Take 1 tablet (25 mg total) by mouth  every 6 (six) hours as needed for nausea or vomiting. Patient not taking: Reported on 10/23/2016 07/02/16   Renne Crigler, PA-C    Family History Family History  Problem Relation Age of Onset  . Family history unknown: Yes    Social History Social History  Substance Use Topics  . Smoking status: Former Smoker    Packs/day: 0.50    Quit date: 09/22/2016  . Smokeless tobacco: Never Used  . Alcohol use No     Comment: occassional      Allergies   Darvocet [propoxyphene n-acetaminophen] and Onion   Review of Systems Review of Systems  Constitutional: Negative for chills and fever.  HENT: Negative for congestion and facial swelling.        Bleeding gums  Eyes: Negative for discharge and visual disturbance.  Respiratory: Negative for shortness of breath.   Cardiovascular: Negative for chest pain and palpitations.  Gastrointestinal: Positive for abdominal pain, blood in stool, nausea and vomiting. Negative for diarrhea.  Genitourinary: Positive for hematuria.  Musculoskeletal: Negative for arthralgias and myalgias.  Skin: Negative for color change and rash.  Neurological: Negative for tremors, syncope and headaches.  Psychiatric/Behavioral: Negative for confusion and dysphoric mood.     Physical Exam Updated Vital Signs BP (!) 180/88 (BP Location: Left Arm)   Pulse (!) 50   Temp 98.3 F (36.8 C) (Oral)   Resp 15   SpO2 99%   Physical Exam  Constitutional: He is oriented to person, place, and time. He appears well-developed and well-nourished.  HENT:  Head: Normocephalic and atraumatic.  Small lacerations to the inside of the lip that correspond with his remaining teeth  Eyes: EOM are normal. Pupils are equal, round, and reactive to light.  Neck: Normal range of motion. Neck supple. No JVD present.  Cardiovascular: Normal rate and regular rhythm.  Exam reveals no gallop and no friction rub.   No murmur heard. Pulmonary/Chest: No respiratory distress. He has no  wheezes.  Abdominal: He exhibits no distension and no mass. There is no tenderness. There is no rebound and no guarding.  Musculoskeletal: Normal range of motion.  Neurological: He is alert and oriented to person, place, and time.  Skin: No rash noted. No pallor.  Psychiatric: He has a normal mood and affect. His behavior is normal.  Nursing note and vitals reviewed.    ED Treatments / Results  Labs (all labs ordered are listed, but only abnormal results are displayed) Labs Reviewed  LIPASE, BLOOD - Abnormal; Notable for the following:       Result Value   Lipase 63 (*)    All other components within normal limits  COMPREHENSIVE METABOLIC PANEL - Abnormal; Notable for the following:    Glucose, Bld 102 (*)    Total Protein 8.7 (*)    ALT 12 (*)    All other components  within normal limits  URINALYSIS, ROUTINE W REFLEX MICROSCOPIC - Abnormal; Notable for the following:    Color, Urine RED (*)    APPearance TURBID (*)    Glucose, UA 50 (*)    Hgb urine dipstick MODERATE (*)    Ketones, ur 5 (*)    Protein, ur 100 (*)    Nitrite POSITIVE (*)    All other components within normal limits  DIC (DISSEMINATED INTRAVASCULAR COAGULATION) PANEL - Abnormal; Notable for the following:    Prothrombin Time >90.0 (*)    INR >10.00 (*)    aPTT 147 (*)    All other components within normal limits  ACETAMINOPHEN LEVEL - Abnormal; Notable for the following:    Acetaminophen (Tylenol), Serum <10 (*)    All other components within normal limits  RAPID URINE DRUG SCREEN, HOSP PERFORMED - Abnormal; Notable for the following:    Opiates POSITIVE (*)    Benzodiazepines POSITIVE (*)    Tetrahydrocannabinol POSITIVE (*)    Barbiturates POSITIVE (*)    All other components within normal limits  CBC  CK  SALICYLATE LEVEL  POC OCCULT BLOOD, ED  TYPE AND SCREEN  ABO/RH    EKG  EKG Interpretation None       Radiology Ct Abdomen Pelvis W Contrast  Result Date: 10/23/2016 CLINICAL DATA:   Nausea and abdominal pain EXAM: CT ABDOMEN AND PELVIS WITH CONTRAST TECHNIQUE: Multidetector CT imaging of the abdomen and pelvis was performed using the standard protocol following bolus administration of intravenous contrast. CONTRAST:  100 mL ISOVUE-300 IOPAMIDOL (ISOVUE-300) INJECTION 61% COMPARISON:  Ultrasound from earlier in the same day, 07/01/2016 FINDINGS: Lower chest: Minimal dependent atelectatic changes are noted. Hepatobiliary: The liver is diffusely decreased in attenuation consistent fatty infiltration. The gallbladder is well distended without focal abnormality. Pancreas: Unremarkable. No pancreatic ductal dilatation or surrounding inflammatory changes. Spleen: Spleen is within normal limits. Adrenals/Urinary Tract: Adrenal glands are unremarkable. Kidneys are normal, without renal calculi, focal lesion, or hydronephrosis. Bladder is decompressed. Stomach/Bowel: Scattered diverticular changes noted. No findings to suggest acute no diverticulitis is seen. Vascular/Lymphatic: Aortic atherosclerosis. No enlarged abdominal or pelvic lymph nodes. Reproductive: Prostate is unremarkable. Other: No abdominal wall hernia or abnormality. No abdominopelvic ascites. Musculoskeletal: Postsurgical changes are noted in the lumbar spine and bilateral proximal femurs. No acute abnormality is seen. IMPRESSION: Chronic changes as described above stable from the previous exam. No acute abnormality noted. Electronically Signed   By: Alcide Clever M.D.   On: 10/23/2016 11:14   US Abdomen Limited Ruq  Result Date: 10/23/2016 CLINICAL DATA:  Abdominal pain with nausea and vomiting pain in pain History of systemic lupus erythematosus EXAM: US ABDOMEN LIMITED - RIGHT UPPER QUADRANT COMPARISON:  CT abdomen and pelvis July 01, 2016 FINDINGS: Gallbladder: No gallstones or wall thickening visualized. There is no pericholecystic fluid. No sonographic Murphy sign noted by sonographer. Common bile duct: Diameter: 3 mm. No  intrahepatic or extrahepatic biliary duct dilatation. Liver: No focal lesion identified. Within normal limits in parenchymal echogenicity. Right kidney is noted to be somewhat echogenic. IMPRESSION: Somewhat echogenic right kidney. This is a finding that may be seen with medical renal disease. Appropriate laboratory correlation advised. Study otherwise unremarkable. Electronically Signed   By: Bretta Bang III M.D.   On: 10/23/2016 08:31    Procedures Procedures (including critical care time)  Medications Ordered in ED Medications  phytonadione (VITAMIN K) 10 mg in dextrose 5 % 50 mL IVPB (10 mg Intravenous New Bag/Given 10/24/16 2347)  morphine 4 MG/ML injection 4 mg (4 mg Intravenous Given 10/24/16 2158)  ondansetron [facility]) injection 4 mg (4 mg Intravenous Given 10/24/16 2157)     Initial Impression / Assessment and Plan / ED Course  I have reviewed the triage vital signs and the nursing notes.  Pertinent labs & imaging results that were available during my care of the patient were reviewed by me and considered in my medical decision making (see chart for details).     51 yo M With a chief complaint of diffuse abdominal pain. Is also complaining now of bleeding from his gums his urine from his rectum. On exam patient has wounds to his lip that correspond with his remaining teeth. Rectal exam without blood no stool. UA with positive hemoglobin but no red blood cells seen on microscopic. Will obtain a CK.  The patient's coags are un measurably high. DIC panel was then ordered. Patient with a normal fibrinogen level normal platelets and normal d-dimer. I think the most likely etiology is that the patient ingested someone else's medication or this was co-ingested with the drug. The patient denies any illegal drug use other than marijuana that he smoked 2 weeks ago.  I will give the patient IV vitamin K. Discussed with the hospitalist for obs.  CRITICAL CARE Performed by: Rae Roam   Total critical care time: 45 minutes  Critical care time was exclusive of separately billable procedures and treating other patients.  Critical care was necessary to treat or prevent imminent or life-threatening deterioration.  Critical care was time spent personally by me on the following activities: development of treatment plan with patient and/or surrogate as well as nursing, discussions with consultants, evaluation of patient's response to treatment, examination of patient, obtaining history from patient or surrogate, ordering and performing treatments and interventions, ordering and review of laboratory studies, ordering and review of radiographic studies, pulse oximetry and re-evaluation of patient's condition.  The patients results and plan were reviewed and discussed.   Any x-rays performed were independently reviewed by myself.   Differential diagnosis were considered with the presenting HPI.  Medications  phytonadione (VITAMIN K) 10 mg in dextrose 5 % 50 mL IVPB (10 mg Intravenous New Bag/Given 10/24/16 2347)  morphine 4 MG/ML injection 4 mg (4 mg Intravenous Given 10/24/16 2158)  ondansetron (ZOFRAN) injection 4 mg (4 mg Intravenous Given 10/24/16 2157)    Vitals:   10/24/16 2215 10/24/16 2255 10/24/16 2351 10/25/16 0028  BP: (!) 159/99 (!) 148/95 (!) 158/105 (!) 180/88  Pulse:  64 (!) 59 (!) 50  Resp:  (!) 21 (!) 25 15  Temp:      TempSrc:      SpO2:  99% 98% 99%    Final diagnoses:  Coagulopathy (HCC)    Admission/ observation were discussed with the admitting physician, patient and/or family and they are comfortable with the plan.    Final Clinical Impressions(s) / ED Diagnoses   Final diagnoses:  Coagulopathy [facility])    New Prescriptions New Prescriptions   No medications on file     Melene Plan, DO 10/25/16 1308

## 2016-10-24 NOTE — ED Notes (Signed)
Lab called to confirm updated Urinalysis is posted to chart. Some values may be affected by color or urine.

## 2016-10-25 ENCOUNTER — Encounter (HOSPITAL_COMMUNITY): Payer: Self-pay | Admitting: Internal Medicine

## 2016-10-25 DIAGNOSIS — K922 Gastrointestinal hemorrhage, unspecified: Secondary | ICD-10-CM

## 2016-10-25 DIAGNOSIS — K920 Hematemesis: Secondary | ICD-10-CM

## 2016-10-25 DIAGNOSIS — K760 Fatty (change of) liver, not elsewhere classified: Secondary | ICD-10-CM | POA: Diagnosis present

## 2016-10-25 DIAGNOSIS — Z79899 Other long term (current) drug therapy: Secondary | ICD-10-CM | POA: Diagnosis not present

## 2016-10-25 DIAGNOSIS — R3129 Other microscopic hematuria: Secondary | ICD-10-CM | POA: Diagnosis not present

## 2016-10-25 DIAGNOSIS — Z87891 Personal history of nicotine dependence: Secondary | ICD-10-CM | POA: Diagnosis not present

## 2016-10-25 DIAGNOSIS — Z981 Arthrodesis status: Secondary | ICD-10-CM | POA: Diagnosis not present

## 2016-10-25 DIAGNOSIS — D689 Coagulation defect, unspecified: Secondary | ICD-10-CM | POA: Diagnosis not present

## 2016-10-25 DIAGNOSIS — R3121 Asymptomatic microscopic hematuria: Secondary | ICD-10-CM | POA: Diagnosis not present

## 2016-10-25 DIAGNOSIS — T604X1A Toxic effect of rodenticides, accidental (unintentional), initial encounter: Secondary | ICD-10-CM

## 2016-10-25 DIAGNOSIS — E440 Moderate protein-calorie malnutrition: Secondary | ICD-10-CM | POA: Insufficient documentation

## 2016-10-25 DIAGNOSIS — J449 Chronic obstructive pulmonary disease, unspecified: Secondary | ICD-10-CM | POA: Diagnosis present

## 2016-10-25 DIAGNOSIS — D688 Other specified coagulation defects: Secondary | ICD-10-CM | POA: Diagnosis present

## 2016-10-25 DIAGNOSIS — R319 Hematuria, unspecified: Secondary | ICD-10-CM

## 2016-10-25 DIAGNOSIS — S301XXA Contusion of abdominal wall, initial encounter: Secondary | ICD-10-CM | POA: Diagnosis present

## 2016-10-25 DIAGNOSIS — R310 Gross hematuria: Secondary | ICD-10-CM | POA: Diagnosis present

## 2016-10-25 DIAGNOSIS — T604X1D Toxic effect of rodenticides, accidental (unintentional), subsequent encounter: Secondary | ICD-10-CM | POA: Diagnosis not present

## 2016-10-25 DIAGNOSIS — R791 Abnormal coagulation profile: Secondary | ICD-10-CM | POA: Diagnosis not present

## 2016-10-25 DIAGNOSIS — E1122 Type 2 diabetes mellitus with diabetic chronic kidney disease: Secondary | ICD-10-CM

## 2016-10-25 DIAGNOSIS — Z6821 Body mass index (BMI) 21.0-21.9, adult: Secondary | ICD-10-CM | POA: Diagnosis not present

## 2016-10-25 DIAGNOSIS — I10 Essential (primary) hypertension: Secondary | ICD-10-CM | POA: Diagnosis not present

## 2016-10-25 DIAGNOSIS — E119 Type 2 diabetes mellitus without complications: Secondary | ICD-10-CM

## 2016-10-25 DIAGNOSIS — G8929 Other chronic pain: Secondary | ICD-10-CM | POA: Diagnosis present

## 2016-10-25 DIAGNOSIS — Z833 Family history of diabetes mellitus: Secondary | ICD-10-CM | POA: Diagnosis not present

## 2016-10-25 DIAGNOSIS — Z885 Allergy status to narcotic agent status: Secondary | ICD-10-CM | POA: Diagnosis not present

## 2016-10-25 DIAGNOSIS — S301XXD Contusion of abdominal wall, subsequent encounter: Secondary | ICD-10-CM | POA: Diagnosis not present

## 2016-10-25 DIAGNOSIS — Z91018 Allergy to other foods: Secondary | ICD-10-CM | POA: Diagnosis not present

## 2016-10-25 DIAGNOSIS — S8010XA Contusion of unspecified lower leg, initial encounter: Secondary | ICD-10-CM | POA: Diagnosis present

## 2016-10-25 DIAGNOSIS — N390 Urinary tract infection, site not specified: Secondary | ICD-10-CM | POA: Diagnosis present

## 2016-10-25 DIAGNOSIS — M7981 Nontraumatic hematoma of soft tissue: Secondary | ICD-10-CM | POA: Diagnosis not present

## 2016-10-25 LAB — URINALYSIS, COMPLETE (UACMP) WITH MICROSCOPIC
BACTERIA UA: NONE SEEN
BILIRUBIN URINE: NEGATIVE
GLUCOSE, UA: NEGATIVE mg/dL
Ketones, ur: 5 mg/dL — AB
LEUKOCYTES UA: NEGATIVE
NITRITE: NEGATIVE
Protein, ur: 100 mg/dL — AB
SPECIFIC GRAVITY, URINE: 1.021 (ref 1.005–1.030)
Squamous Epithelial / LPF: NONE SEEN
pH: 5 (ref 5.0–8.0)

## 2016-10-25 LAB — PROTIME-INR
INR: 2.83
INR: 4.1 — AB
INR: 5.15
Prothrombin Time: 30.4 seconds — ABNORMAL HIGH (ref 11.4–15.2)
Prothrombin Time: 40.8 seconds — ABNORMAL HIGH (ref 11.4–15.2)
Prothrombin Time: 49 seconds — ABNORMAL HIGH (ref 11.4–15.2)

## 2016-10-25 LAB — ABO/RH: ABO/RH(D): O POS

## 2016-10-25 LAB — BASIC METABOLIC PANEL
Anion gap: 10 (ref 5–15)
BUN: 13 mg/dL (ref 6–20)
CALCIUM: 9 mg/dL (ref 8.9–10.3)
CO2: 24 mmol/L (ref 22–32)
CREATININE: 0.95 mg/dL (ref 0.61–1.24)
Chloride: 102 mmol/L (ref 101–111)
GFR calc Af Amer: >60 mL/min (ref >60)
Glucose, Bld: 94 mg/dL (ref 65–99)
Potassium: 3.6 mmol/L (ref 3.5–5.1)
Sodium: 136 mmol/L (ref 135–145)

## 2016-10-25 LAB — GLUCOSE, CAPILLARY
GLUCOSE-CAPILLARY: 100 mg/dL — AB (ref 65–99)
Glucose-Capillary: 101 mg/dL — ABNORMAL HIGH (ref 65–99)
Glucose-Capillary: 95 mg/dL (ref 65–99)
Glucose-Capillary: 163 mg/dL — ABNORMAL HIGH (ref 65–99)

## 2016-10-25 LAB — CBC
HCT: 43.3 % (ref 39.0–52.0)
Hemoglobin: 14.8 g/dL (ref 13.0–17.0)
MCH: 26.7 pg (ref 26.0–34.0)
MCHC: 34.2 g/dL (ref 30.0–36.0)
MCV: 78.2 fL (ref 78.0–100.0)
PLATELETS: 204 10*3/uL (ref 150–400)
RBC: 5.54 MIL/uL (ref 4.22–5.81)
RDW: 14 % (ref 11.5–15.5)
WBC: 4.7 10*3/uL (ref 4.0–10.5)

## 2016-10-25 LAB — HIV ANTIBODY (ROUTINE TESTING W REFLEX): HIV SCREEN 4TH GENERATION: NONREACTIVE

## 2016-10-25 LAB — FIBRINOGEN: FIBRINOGEN: 439 mg/dL (ref 210–475)

## 2016-10-25 LAB — APTT
APTT: 81 s — AB (ref 24–36)
aPTT: 82 seconds — ABNORMAL HIGH (ref 24–36)

## 2016-10-25 MED ORDER — SODIUM CHLORIDE 0.9 % IV SOLN
Freq: Once | INTRAVENOUS | Status: AC
  Administered 2016-10-25: 22:00:00 via INTRAVENOUS

## 2016-10-25 MED ORDER — INSULIN ASPART 100 UNIT/ML ~~LOC~~ SOLN
0.0000 [IU] | SUBCUTANEOUS | Status: DC
  Administered 2016-10-25: 2 [IU] via SUBCUTANEOUS

## 2016-10-25 MED ORDER — ORAL CARE MOUTH RINSE
15.0000 mL | Freq: Two times a day (BID) | OROMUCOSAL | Status: DC
  Administered 2016-10-27 – 2016-10-29 (×7): 15 mL via OROMUCOSAL

## 2016-10-25 MED ORDER — HYDRALAZINE HCL 20 MG/ML IJ SOLN
10.0000 mg | INTRAMUSCULAR | Status: DC | PRN
  Administered 2016-10-25: 10 mg via INTRAVENOUS
  Filled 2016-10-25: qty 1

## 2016-10-25 MED ORDER — PHYTONADIONE 5 MG PO TABS
10.0000 mg | ORAL_TABLET | Freq: Two times a day (BID) | ORAL | Status: DC
  Administered 2016-10-25: 10 mg via ORAL
  Filled 2016-10-25: qty 2

## 2016-10-25 MED ORDER — BUPRENORPHINE HCL-NALOXONE HCL 2-0.5 MG SL SUBL
2.0000 | SUBLINGUAL_TABLET | Freq: Two times a day (BID) | SUBLINGUAL | Status: DC
  Administered 2016-10-25 – 2016-10-30 (×11): 2 via SUBLINGUAL
  Filled 2016-10-25 (×11): qty 2

## 2016-10-25 MED ORDER — ONDANSETRON HCL 4 MG/2ML IJ SOLN
4.0000 mg | Freq: Four times a day (QID) | INTRAMUSCULAR | Status: DC | PRN
  Administered 2016-10-25 (×2): 4 mg via INTRAVENOUS
  Filled 2016-10-25 (×2): qty 2

## 2016-10-25 MED ORDER — PANTOPRAZOLE SODIUM 40 MG IV SOLR
40.0000 mg | Freq: Two times a day (BID) | INTRAVENOUS | Status: DC
  Administered 2016-10-25 – 2016-10-29 (×10): 40 mg via INTRAVENOUS
  Filled 2016-10-25 (×10): qty 40

## 2016-10-25 MED ORDER — SODIUM CHLORIDE 0.9 % IV SOLN
Freq: Once | INTRAVENOUS | Status: AC
  Administered 2016-10-25: 04:00:00 via INTRAVENOUS

## 2016-10-25 MED ORDER — AMLODIPINE BESYLATE 5 MG PO TABS
5.0000 mg | ORAL_TABLET | Freq: Every day | ORAL | Status: DC
  Administered 2016-10-25 – 2016-10-30 (×6): 5 mg via ORAL
  Filled 2016-10-25 (×6): qty 1

## 2016-10-25 MED ORDER — MORPHINE SULFATE (PF) 4 MG/ML IV SOLN
1.0000 mg | INTRAVENOUS | Status: DC | PRN
  Administered 2016-10-25 – 2016-10-29 (×20): 1 mg via INTRAVENOUS
  Filled 2016-10-25 (×20): qty 1

## 2016-10-25 MED ORDER — ONDANSETRON HCL 4 MG PO TABS: 4.0000 mg | ORAL_TABLET | Freq: Four times a day (QID) | ORAL | Status: DC | PRN

## 2016-10-25 MED ORDER — VITAMIN K1 10 MG/ML IJ SOLN
10.0000 mg | Freq: Once | INTRAMUSCULAR | Status: AC
  Administered 2016-10-25: 10 mg via SUBCUTANEOUS
  Filled 2016-10-25: qty 1

## 2016-10-25 MED ORDER — SODIUM CHLORIDE 0.9 % IV SOLN
INTRAVENOUS | Status: AC
  Administered 2016-10-25 (×2): via INTRAVENOUS

## 2016-10-25 MED ORDER — CHLORHEXIDINE GLUCONATE 0.12 % MT SOLN
15.0000 mL | Freq: Two times a day (BID) | OROMUCOSAL | Status: DC
  Administered 2016-10-25 – 2016-10-30 (×9): 15 mL via OROMUCOSAL
  Filled 2016-10-25 (×9): qty 15

## 2016-10-25 NOTE — Progress Notes (Addendum)
PROGRESS NOTE  [name] ZOX:[id number] DOB: 1965/12/04 DOA: 10/24/2016 PCP: [facility]ov[facility]  Brief History:   51 y.o. male with history of diabetes mellitus type 2, hypertension, COPD and chronic pain presents with the ER with complaints of persistent pain in the abdomen with nausea vomiting and rectal bleeding of 2-3 day duration.  Patient also complains of bleeding gums for the past 2-3 days. Patient also complained of abdominal pain. He visited the emergency department on 10/23/2016. CT of the abdomen and pelvis at that time was negative for any acute abnormalities. Fatty liver was noted. Since admission, the patient states that his emesis has improved and feels hungry. He states that his abdominal pain has also improved. His last bowel movement was on 10/23/2016. He was noted to have a coagulopathy with INR greater than 10 and PTT 147. Urinalysis also showed TNTC RBC. As a result, the patient was admitted for further evaluation.  Assessment/Plan: Coagulopathy -presenting INR >10, PTT 147 -fibrinogen 419 -?synthetic liver dysfunction vs organophosphate toxicity -consulted hematology--Dr. Candise Che -1 unit FFP and vitamin K 10mg  IV given -INR improves to 2.83 -still has some bleeding gums and hematuria -FOBT--negative -recheck INR, PTT  Hematuria -10/23/16 CT abd/pelvis with contrast--no acute findings -consult urology -?coagulopathy unmasking underlying bladder issue? -UA with TNTC RBC on 4/23  Pyuria -pt without dysuria -repeat UA with culture  Diabetes mellitus  -check A1C -novolog sliding scale -holding metformin and amaryl  HTN -not on any medications outpt -start amlodipine  COPD -stable on RA  Chronic pain syndrome -continue home dose suboxone -I have reviewed NCSRS data base--pt getting only from one source  Moderate malnutrition -continue supplements        Disposition Plan:   Home in 1-2 days  Family Communication:  No  Family at bedside  Consultants:  Hematology--Dr. Candise Che; urology  Code Status:  FULL   DVT Prophylaxis:  SCDs Total time spent 35 minutes.  Greater than 50% spent face to face counseling and coordinating care. 1235 to 1310    Procedures: As Listed in Progress Note Above  Antibiotics: None    Subjective: Patient says that his emesis is better. His abdominal pain is improved. He wants to eat. Denies any fevers, chills, chest pain, vomiting, diarrhea, hematochezia, melena.  Objective: Vitals:   10/25/16 0235 10/25/16 0411 10/25/16 0445 10/25/16 0604  BP: (!) 166/81 (!) 163/90 (!) 160/91 (!) 146/88  Pulse: (!) 59 71 (!) 59 62  Resp: 20 20 17 18   Temp: 98.7 F (37.1 C) 99.1 F (37.3 C) 98.7 F (37.1 C) 99.2 F (37.3 C)  TempSrc: Oral Oral Oral Oral  SpO2: 100% 99% 100% 92%  Weight: 64.5 kg (142 lb 4.8 oz)     Height: 5\' 9"  (1.753 m)       Intake/Output Summary (Last 24 hours) at 10/25/16 1253 Last data filed at 10/25/16 0600  Gross per 24 hour  Intake            406.5 ml  Output              400 ml  Net              6.5 ml   Weight change:  Exam:   General:  Pt is alert, follows commands appropriately, not in acute distress  HEENT: No icterus, No thrush, No neck mass, [place]/AT  Cardiovascular: RRR, S1/S2, no rubs, no gallops  Respiratory: CTA bilaterally,  no wheezing, no crackles, no rhonchi  Abdomen: Soft/+BS, non tender, non distended, no guarding  Extremities: No edema, No lymphangitis, No petechiae, No rashes, no synovitis   Data Reviewed: I have personally reviewed following labs and imaging studies Basic Metabolic Panel:  Recent Labs Lab 10/23/16 0526 10/24/16 0239 10/24/16 1841 10/25/16 0518  NA 136 136 136 136  K 3.8 3.6 3.7 3.6  CL 104 104 101 102  CO2 24 24 24 24   GLUCOSE 95 98 102* 94  BUN 11 12 9 13   CREATININE 1.02 0.92 0.97 0.95  CALCIUM 8.9 8.9 9.1 9.0   Liver Function Tests:  Recent Labs Lab 10/23/16 0526 10/24/16 0239  10/24/16 1841  AST 19 18 19   ALT 10* 11* 12*  ALKPHOS 57 54 58  BILITOT 0.3 0.4 0.5  PROT 7.8 8.0 8.7*  ALBUMIN 3.9 4.1 4.4    Recent Labs Lab 10/23/16 0526 10/24/16 0239 10/24/16 1841  LIPASE 28 33 63*   No results for input(s): AMMONIA in the last 168 hours. Coagulation Profile:  Recent Labs Lab 10/24/16 2137 10/25/16 0518  INR >10.00* 2.83   CBC:  Recent Labs Lab 10/23/16 0526 10/24/16 0239 10/24/16 1841 10/24/16 2137 10/25/16 0518  WBC 5.1 5.3 4.4  --  4.7  HGB 15.2 15.0 15.3  --  14.8  HCT 45.7 44.0 44.7  --  43.3  MCV 81.0 80.9 78.3  --  78.2  PLT 240 209 213 213 204   Cardiac Enzymes:  Recent Labs Lab 10/24/16 2027  CKTOTAL 179   BNP: Invalid input(s): POCBNP CBG:  Recent Labs Lab 10/25/16 0240 10/25/16 0742 10/25/16 1217  GLUCAP 101* 100* 95   HbA1C: No results for input(s): HGBA1C in the last 72 hours. Urine analysis:    Component Value Date/Time   COLORURINE RED (A) 10/24/2016 1831   APPEARANCEUR TURBID (A) 10/24/2016 1831   LABSPEC 1.019 10/24/2016 1831   PHURINE 5.0 10/24/2016 1831   GLUCOSEU 50 (A) 10/24/2016 1831   HGBUR MODERATE (A) 10/24/2016 1831   BILIRUBINUR NEGATIVE 10/24/2016 1831   KETONESUR 5 (A) 10/24/2016 1831   PROTEINUR 100 (A) 10/24/2016 1831   UROBILINOGEN 1.0 03/20/2014 2351   NITRITE POSITIVE (A) 10/24/2016 1831   LEUKOCYTESUR NEGATIVE 10/24/2016 1831   Sepsis Labs: @LABRCNTIP (procalcitonin:4,lacticidven:4) )No results found for this or any previous visit (from the past 240 hour(s)).   Scheduled Meds: . buprenorphine-naloxone  2 tablet Sublingual BID  . chlorhexidine  15 mL Mouth Rinse BID  . insulin aspart  0-9 Units Subcutaneous Q4H  . mouth rinse  15 mL Mouth Rinse q12n4p  . pantoprazole (PROTONIX) IV  40 mg Intravenous Q12H   Continuous Infusions: . sodium chloride 50 mL/hr at 10/25/16 1045    Procedures/Studies: Ct Abdomen Pelvis W Contrast  Result Date: 10/23/2016 CLINICAL DATA:  Nausea  and abdominal pain EXAM: CT ABDOMEN AND PELVIS WITH CONTRAST TECHNIQUE: Multidetector CT imaging of the abdomen and pelvis was performed using the standard protocol following bolus administration of intravenous contrast. CONTRAST:  100 mL ISOVUE-300 IOPAMIDOL (ISOVUE-300) INJECTION 61% COMPARISON:  Ultrasound from earlier in the same day, 07/01/2016 FINDINGS: Lower chest: Minimal dependent atelectatic changes are noted. Hepatobiliary: The liver is diffusely decreased in attenuation consistent fatty infiltration. The gallbladder is well distended without focal abnormality. Pancreas: Unremarkable. No pancreatic ductal dilatation or surrounding inflammatory changes. Spleen: Spleen is within normal limits. Adrenals/Urinary Tract: Adrenal glands are unremarkable. Kidneys are normal, without renal calculi, focal lesion, or hydronephrosis. Bladder is decompressed. Stomach/Bowel: Scattered  diverticular changes noted. No findings to suggest acute no diverticulitis is seen. Vascular/Lymphatic: Aortic atherosclerosis. No enlarged abdominal or pelvic lymph nodes. Reproductive: Prostate is unremarkable. Other: No abdominal wall hernia or abnormality. No abdominopelvic ascites. Musculoskeletal: Postsurgical changes are noted in the lumbar spine and bilateral proximal femurs. No acute abnormality is seen. IMPRESSION: Chronic changes as described above stable from the previous exam. No acute abnormality noted. Electronically Signed   By: Alcide Clever M.D.   On: 10/23/2016 11:14   US Abdomen Limited Ruq  Result Date: 10/23/2016 CLINICAL DATA:  Abdominal pain with nausea and vomiting pain in pain History of systemic lupus erythematosus EXAM: US ABDOMEN LIMITED - RIGHT UPPER QUADRANT COMPARISON:  CT abdomen and pelvis July 01, 2016 FINDINGS: Gallbladder: No gallstones or wall thickening visualized. There is no pericholecystic fluid. No sonographic Murphy sign noted by sonographer. Common bile duct: Diameter: 3 mm. No  intrahepatic or extrahepatic biliary duct dilatation. Liver: No focal lesion identified. Within normal limits in parenchymal echogenicity. Right kidney is noted to be somewhat echogenic. IMPRESSION: Somewhat echogenic right kidney. This is a finding that may be seen with medical renal disease. Appropriate laboratory correlation advised. Study otherwise unremarkable. Electronically Signed   By: Bretta Bang III M.D.   On: 10/23/2016 08:31    Joslyne Marshburn, DO  [facility] Pager [phone]  If 7PM-7AM, please contact night-coverage [email or web address] Password [facility] 10/25/2016, 12:53 PM   LOS: 0 days

## 2016-10-25 NOTE — Progress Notes (Signed)
CRITICAL VALUE STICKER  CRITICAL VALUE: INR 5.15  RECEIVER (on-site recipient of call): Yazmyn Valbuena RN  DATE & TIME NOTIFIED: 10/25/2016   MESSENGER (representative from lab): Sarah  MD NOTIFIED: Donnamarie Poag  TIME OF NOTIFICATION: 2030  RESPONSE: no new orders at this time

## 2016-10-25 NOTE — Consult Note (Signed)
Urology Consult  Referring physician: D Tat Reason for referral: Blood in urine  Chief Complaint: Blood in urine  History of Present Illness: Multiple medical issues; bleeding gums and GI symptoms admission; INR was 10; use to smoke; Vit K given and FFP; microhematuria; CTscan normal  Dark hematuria x 2 days: ? Nonspecific belly pain and no dysuria  No stone or UTI or GU surgery history  Modifying factors: There are no other modifying factors  Associated signs and symptoms: There are no other associated signs and symptoms Aggravating and relieving factors: There are no other aggravating or relieving factors Severity: Moderate Duration: Persistent  Past Medical History:  Diagnosis Date  . Complication of anesthesia   . COPD (chronic obstructive pulmonary disease) (HCC)    chronic Bronchhititis  . Diabetes mellitus   . Hypertension   . 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  . Pneumonia   . Seizures ([place])    last one 3 years ago.  Md discontinued meds- Dr Jannifer Odean   Past Surgical History:  Procedure Laterality Date  . ANTERIOR CERVICAL DECOMP/DISCECTOMY FUSION  12/20/2011   Procedure: ANTERIOR CERVICAL DECOMPRESSION/DISCECTOMY FUSION 2 LEVELS;  Surgeon: Elaina Hoops, MD;  Location: [facility];  Service: Neurosurgery;  Laterality: N/A;  Cervical four-five, five-six anterior cervical decompression/discectomy fusion with plating  . BACK SURGERY     5 from degenerative disease..    . HIP SURGERY     rods to each hip- hip can ot of socket.  . TONSILLECTOMY    . WRIST SURGERY     repair due to crush    Medications: I have reviewed the patient's current medications. Allergies:  Allergies  Allergen Reactions  . Darvocet [Propoxyphene N-Acetaminophen] Itching and Nausea And Vomiting  . Onion Itching    Family History  Problem Relation Age of Onset  . Diabetes Mellitus II Mother    Social History:  reports that he quit smoking about 4 weeks ago. He smoked 0.50  packs per day. He has never used smokeless tobacco. He reports that he does not drink alcohol or use drugs.  ROS: All systems are reviewed and negative except as noted. Rest negative  Physical Exam:  Vital signs in last 24 hours: Temp:  [98.3 F (36.8 C)-99.2 F (37.3 C)] 99.2 F (37.3 C) (04/25 0604) Pulse Rate:  [48-75] 62 (04/25 0604) Resp:  [15-27] 18 (04/25 0604) BP: (140-194)/(81-115) 146/88 (04/25 0604) SpO2:  [92 %-100 %] 92 % (04/25 0604) Weight:  [64.5 kg (142 lb 4.8 oz)] 64.5 kg (142 lb 4.8 oz) (04/25 0235)  Cardiovascular: Skin warm; not flushed Respiratory: Breaths quiet; no shortness of breath Abdomen: No masses Neurological: Normal sensation to touch Musculoskeletal: Normal motor function arms and legs Lymphatics: No inguinal adenopathy Skin: No rashes Genitourinary:no overdistension  Laboratory Data:  Results for orders placed or performed during the hospital encounter of 10/24/16 (from the past 72 hour(s))  Urinalysis, Routine w reflex microscopic     Status: Abnormal   Collection Time: 10/24/16  6:31 PM  Result Value Ref Range   Color, Urine RED (A) YELLOW    Comment: BIOCHEMICALS MAY BE AFFECTED BY COLOR CORRECTED RESULTS CALLED TO: E.BEAULAURIER AT 2100 10/24/16 BY N.THOMPSON CORRECTED ON 04/24 AT 2021: PREVIOUSLY REPORTED AS YELLOW    APPearance TURBID (A) CLEAR   Specific Gravity, Urine 1.019 1.005 - 1.030    Comment: BIOCHEMICALS MAY BE AFFECTED BY COLOR   pH 5.0 5.0 - 8.0  Comment: BIOCHEMICALS MAY BE AFFECTED BY COLOR   Glucose, UA 50 (A) NEGATIVE mg/dL    Comment: BIOCHEMICALS MAY BE AFFECTED BY COLOR   Hgb urine dipstick MODERATE (A) NEGATIVE    Comment: BIOCHEMICALS MAY BE AFFECTED BY COLOR   Bilirubin Urine NEGATIVE NEGATIVE    Comment: BIOCHEMICALS MAY BE AFFECTED BY COLOR   Ketones, ur 5 (A) NEGATIVE mg/dL    Comment: BIOCHEMICALS MAY BE AFFECTED BY COLOR   Protein, ur 100 (A) NEGATIVE mg/dL    Comment: BIOCHEMICALS MAY BE AFFECTED BY  COLOR   Nitrite POSITIVE (A) NEGATIVE    Comment: BIOCHEMICALS MAY BE AFFECTED BY COLOR   Leukocytes, UA NEGATIVE NEGATIVE    Comment: BIOCHEMICALS MAY BE AFFECTED BY COLOR   RBC / HPF TOO NUMEROUS TO COUNT 0 - 5 RBC/hpf    Comment: CORRECTED ON 04/24 AT 2024: PREVIOUSLY REPORTED AS NONE SEEN   WBC, UA TOO NUMEROUS TO COUNT 0 - 5 WBC/hpf    Comment: CORRECTED ON 04/24 AT 2024: PREVIOUSLY REPORTED AS NONE SEEN   Bacteria, UA NONE SEEN NONE SEEN   Squamous Epithelial / LPF NONE SEEN NONE SEEN  Rapid urine drug screen (hospital performed)     Status: Abnormal   Collection Time: 10/24/16  6:31 PM  Result Value Ref Range   Opiates POSITIVE (A) NONE DETECTED   Cocaine NONE DETECTED NONE DETECTED   Benzodiazepines POSITIVE (A) NONE DETECTED   Amphetamines NONE DETECTED NONE DETECTED   Tetrahydrocannabinol POSITIVE (A) NONE DETECTED   Barbiturates POSITIVE (A) NONE DETECTED    Comment:        DRUG SCREEN FOR MEDICAL PURPOSES ONLY.  IF CONFIRMATION IS NEEDED FOR ANY PURPOSE, NOTIFY LAB WITHIN 5 DAYS.        LOWEST DETECTABLE LIMITS FOR URINE DRUG SCREEN Drug Class       Cutoff (ng/mL) Amphetamine      1000 Barbiturate      200 Benzodiazepine   834 Tricyclics       196 Opiates          300 Cocaine          300 THC              50   Lipase, blood     Status: Abnormal   Collection Time: 10/24/16  6:41 PM  Result Value Ref Range   Lipase 63 (H) 11 - 51 U/L  Comprehensive metabolic panel     Status: Abnormal   Collection Time: 10/24/16  6:41 PM  Result Value Ref Range   Sodium 136 135 - 145 mmol/L   Potassium 3.7 3.5 - 5.1 mmol/L   Chloride 101 101 - 111 mmol/L   CO2 24 22 - 32 mmol/L   Glucose, Bld 102 (H) 65 - 99 mg/dL   BUN 9 6 - 20 mg/dL   Creatinine, Ser 0.97 0.61 - 1.24 mg/dL   Calcium 9.1 8.9 - 10.3 mg/dL   Total Protein 8.7 (H) 6.5 - 8.1 g/dL   Albumin 4.4 3.5 - 5.0 g/dL   AST 19 15 - 41 U/L   ALT 12 (L) 17 - 63 U/L   Alkaline Phosphatase 58 38 - 126 U/L   Total  Bilirubin 0.5 0.3 - 1.2 mg/dL   GFR calc non Af Amer >60 >60 mL/min   GFR calc Af Amer >60 >60 mL/min    Comment: (NOTE) The eGFR has been calculated using the CKD EPI equation. This calculation has not been validated  in all clinical situations. eGFR's persistently <60 mL/min signify possible Chronic Kidney Disease.    Anion gap 11 5 - 15  CBC     Status: None   Collection Time: 10/24/16  6:41 PM  Result Value Ref Range   WBC 4.4 4.0 - 10.5 K/uL   RBC 5.71 4.22 - 5.81 MIL/uL   Hemoglobin 15.3 13.0 - 17.0 g/dL   HCT 44.7 39.0 - 52.0 %   MCV 78.3 78.0 - 100.0 fL   MCH 26.8 26.0 - 34.0 pg   MCHC 34.2 30.0 - 36.0 g/dL   RDW 14.1 11.5 - 15.5 %   Platelets 213 150 - 400 K/uL  Type and screen [place]     Status: None   Collection Time: 10/24/16  6:41 PM  Result Value Ref Range   ABO/RH(D) O POS    Antibody Screen NEG    Sample Expiration 10/27/2016   ABO/Rh     Status: None   Collection Time: 10/24/16  6:42 PM  Result Value Ref Range   ABO/RH(D) O POS   POC occult blood, ED     Status: None   Collection Time: 10/24/16  8:06 PM  Result Value Ref Range   Fecal Occult Bld NEGATIVE NEGATIVE  CK     Status: None   Collection Time: 10/24/16  8:27 PM  Result Value Ref Range   Total CK 179 49 - 397 U/L  DIC panel     Status: Abnormal   Collection Time: 10/24/16  9:37 PM  Result Value Ref Range   Prothrombin Time >90.0 (H) 11.4 - 15.2 seconds   INR >10.00 (HH)     Comment: REPEATED TO VERIFY CRITICAL RESULT CALLED TO, READ BACK BY AND VERIFIED WITH: BEAULAURIER,E RN 4.24.18 @2309  ZANDO,C    aPTT 147 (H) 24 - 36 seconds    Comment:        IF BASELINE aPTT IS ELEVATED, SUGGEST PATIENT RISK ASSESSMENT BE USED TO DETERMINE APPROPRIATE ANTICOAGULANT THERAPY.    Fibrinogen 419 210 - 475 mg/dL   D-Dimer, Quant <0.27 0.00 - 0.50 ug/mL-FEU    Comment: (NOTE) At the manufacturer cut-off of 0.50 ug/mL FEU, this assay has been documented to exclude PE with a  sensitivity and negative predictive value of 97 to 99%.  At this time, this assay has not been approved by the FDA to exclude DVT/VTE. Results should be correlated with clinical presentation.    Platelets 213 150 - 400 K/uL   Smear Review NO SCHISTOCYTES SEEN   Acetaminophen level     Status: Abnormal   Collection Time: 10/24/16  9:56 PM  Result Value Ref Range   Acetaminophen (Tylenol), Serum <10 (L) 10 - 30 ug/mL    Comment:        THERAPEUTIC CONCENTRATIONS VARY SIGNIFICANTLY. A RANGE OF 10-30 ug/mL MAY BE AN EFFECTIVE CONCENTRATION FOR MANY PATIENTS. HOWEVER, SOME ARE BEST TREATED AT CONCENTRATIONS OUTSIDE THIS RANGE. ACETAMINOPHEN CONCENTRATIONS >150 ug/mL AT 4 HOURS AFTER INGESTION AND >50 ug/mL AT 12 HOURS AFTER INGESTION ARE OFTEN ASSOCIATED WITH TOXIC REACTIONS.   Salicylate level     Status: None   Collection Time: 10/24/16  9:56 PM  Result Value Ref Range   Salicylate Lvl <2.8 2.8 - 30.0 mg/dL  Prepare fresh frozen plasma     Status: None (Preliminary result)   Collection Time: 10/25/16  1:32 AM  Result Value Ref Range   Unit Number [id number]    Blood Component Type THAWED  PLASMA    Unit division 00    Status of Unit ISSUED    Transfusion Status OK TO TRANSFUSE   Glucose, capillary     Status: Abnormal   Collection Time: 10/25/16  2:40 AM  Result Value Ref Range   Glucose-Capillary 101 (H) 65 - 99 mg/dL  Protime-INR     Status: Abnormal   Collection Time: 10/25/16  5:18 AM  Result Value Ref Range   Prothrombin Time 30.4 (H) 11.4 - 15.2 seconds   INR 2.11   Basic metabolic panel     Status: None   Collection Time: 10/25/16  5:18 AM  Result Value Ref Range   Sodium 136 135 - 145 mmol/L   Potassium 3.6 3.5 - 5.1 mmol/L   Chloride 102 101 - 111 mmol/L   CO2 24 22 - 32 mmol/L   Glucose, Bld 94 65 - 99 mg/dL   BUN 13 6 - 20 mg/dL   Creatinine, Ser 0.95 0.61 - 1.24 mg/dL   Calcium 9.0 8.9 - 10.3 mg/dL   GFR calc non Af Amer >60 >60 mL/min   GFR calc  Af Amer >60 >60 mL/min    Comment: (NOTE) The eGFR has been calculated using the CKD EPI equation. This calculation has not been validated in all clinical situations. eGFR's persistently <60 mL/min signify possible Chronic Kidney Disease.    Anion gap 10 5 - 15  CBC     Status: None   Collection Time: 10/25/16  5:18 AM  Result Value Ref Range   WBC 4.7 4.0 - 10.5 K/uL   RBC 5.54 4.22 - 5.81 MIL/uL   Hemoglobin 14.8 13.0 - 17.0 g/dL   HCT 43.3 39.0 - 52.0 %   MCV 78.2 78.0 - 100.0 fL   MCH 26.7 26.0 - 34.0 pg   MCHC 34.2 30.0 - 36.0 g/dL   RDW 14.0 11.5 - 15.5 %   Platelets 204 150 - 400 K/uL  Glucose, capillary     Status: Abnormal   Collection Time: 10/25/16  7:42 AM  Result Value Ref Range   Glucose-Capillary 100 (H) 65 - 99 mg/dL  Glucose, capillary     Status: None   Collection Time: 10/25/16 12:17 PM  Result Value Ref Range   Glucose-Capillary 95 65 - 99 mg/dL   No results found for this or any previous visit (from the past 240 hour(s)). Creatinine:  Recent Labs  10/23/16 0526 10/24/16 0239 10/24/16 1841 10/25/16 0518  CREATININE 1.02 0.92 0.97 0.95    Xrays: See report/chart reviewed  Impression/Assessment:  Gross hematuria with normal CT Not in retention Send urine to rule out UTI  Plan:  Gave him my name and number to f/up as outpr  Alfonsa Vaile A 10/25/2016, 1:09 PM

## 2016-10-25 NOTE — Progress Notes (Signed)
CRITICAL VALUE ALERT  Critical value received:  INR 4.`0   Date of notification:  10/25/16  Time of notification:  1730  Critical value read back:Yes.    Nurse who received alert:  Phillis Knack, RN  MD notified (1st page):  Tat  Time of first page:  1732  MD notified (2nd page):  Time of second page:  Responding MD:  Tat  Time MD responded:  1735- MD to place orders

## 2016-10-25 NOTE — Progress Notes (Signed)
Initial Nutrition Assessment  DOCUMENTATION CODES:   Non-severe (moderate) malnutrition in context of chronic illness  INTERVENTION:   RD will order supplements and MVI when diet advanced.    NUTRITION DIAGNOSIS:   Malnutrition (moderate) related to chronic illness, COPD, nausea as evidenced by severe depletion of muscle mass, 8 percent weight loss in one month, moderate depletion of body fat.  GOAL:   Patient will meet greater than or equal to 90% of their needs  MONITOR:   Diet advancement, Labs, Weight trends  REASON FOR ASSESSMENT:   Malnutrition Screening Tool    ASSESSMENT:   51 y.o. male with history of diabetes mellitus type 2, hypertension, COPD and chronic pain presents with the ER with complaints of persistent pain in the abdomen with nausea vomiting and rectal bleeding.  Admitted for coagulopathy   Met with pt in room today. Pt reports good appetite and oral intake up until 3 days pta when he started having abdominal pain and nausea. Pt reports continued nausea today but reports its controlled with medication. Pt upset because he is NPO and unable to eat. Pt reports ravenous appetite and reports that his stomach is trying to "eat itself". Pt willing to drink any supplement we send him. Pt reports that he weighed 155lbs one month ago; this would be a 13lb(8%) wt loss in one month which is significant given history. RD will order supplements when diet advanced.     Medications reviewed and include: suboxone, insulin, protonix, morphine, zofran  Labs reviewed: PT 30.4(H), INR 2.83 wnl, APTT 147(H)  Nutrition-Focused physical exam completed. Findings are moderate fat depletion in chest, severe muscle depletion in clavicles, shoulders, and legs, and no edema.   Diet Order:  Diet NPO time specified  Skin:  Reviewed, no issues  Last BM:  4/24  Height:   Ht Readings from Last 1 Encounters:  10/25/16 5' 9"  (1.753 m)    Weight:   Wt Readings from Last 1  Encounters:  10/25/16 142 lb 4.8 oz (64.5 kg)    Ideal Body Weight:  72.7 kg  BMI:  Body mass index is 21.01 kg/m.  Estimated Nutritional Needs:   Kcal:  1900-2200kcal/day   Protein:  97-110g/day   Fluid:  >1.9L/day   EDUCATION NEEDS:   No education needs identified at this time  Koleen Distance, RD, LDN Pager #[phone]

## 2016-10-25 NOTE — Consult Note (Addendum)
[name]    HEMATOLOGY/ONCOLOGY CONSULTATION NOTE  Date of Service: 10/25/2016  Patient Care Team: [facility] as PCP - General (Family Medicine)  CHIEF COMPLAINTS/PURPOSE OF CONSULTATION:  Severe coagulopathy with rectal bleeding, hematemesis and hematuria  HISTORY OF PRESENTING ILLNESS:   [name] is a 50 y.o. male who has been referred to [place] by Dr Catarina Hartshorn, MD for evaluation and management of Severe coagulopathy with rectal bleeding, hematemesis and hematuria.  Patient has a history of hypertension, diabetes, COPD, previous seizures who presented yesterday with symptoms of persistent generalized abdominal discomfort nausea associated with hematemesis and rectal bleeding. Patient notes that his symptoms started on Sunday about 3-4 days ago. He had a previous CT abdomen pelvis with contrast and ultrasound of the abdomen on 10/23/2016 which did not show any overt acute pathology.  Abdominal pain was mostly crampy and associated with burgundy colored hematochezia. Patient reports that he was treated with antibiotics for an intestinal infection about 4-6 weeks ago.  He also notes one to 2 episodes of coffee-ground emesis. Patient notes that the day after the rectal bleeding and coffee-ground emesis he noted dark-colored urine and was concerned with blood in the urine. He also noted that he was developing some mouth ulcers.  In the emergency room the patient had labs done which showed a prothrombin time of more than 90 with INR more than 10 and a PTT of 147 with a normal fibrinogen of 419. Acetaminophen levels within normal limits. Patient notes no history of known liver disease. Denies excessive alcohol use. Acute Hepatitis panel was negative HIV testing nonreactive.  Patient denies being on any blood thinners. Denies using aspirin or NSAIDs. Denies taking warfarin or other blood thinners from any other relatives by mistake. Denies any suicidal attempts.  He does  report that 1-2 weeks ago he did handle D Con (Rat poison containing Brodifacoum) which she placed in the shed. He notes that there was water flooding the shed and he walked into the water with old shoes was soles were cracked and had gotten issues weight. He notes it is quite conceivable that he has had exposure to the right poison. Denies suicidal attempt at right poison ingestion. Notes no suicidal ideation. Denies the possibility that anybody would poison him.  Dr Janyth Contes who was on-call hematologist was called and recommended IV vitamin K 10 mg and 1 unit of FFP and thought that the patient probably had liver disease.  Patient's repeat INR today was 2.83 with a PT of 30.4.   I was officially consulted this afternoon to evaluate the patient. Patient notes no active rectal bleeding at this time. His abdominal discomfort is somewhat better. No further hematemesis. Hemoglobin has remained stable and is currently at 14.8.   MEDICAL HISTORY:  Past Medical History:  Diagnosis Date  . Complication of anesthesia   . COPD (chronic obstructive pulmonary disease) (HCC)    chronic Bronchhititis  . Diabetes mellitus   . Hypertension   . 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  . Pneumonia   . Seizures (HCC)    last one 3 years ago.  Md discontinued meds- Dr Anne Hahn    SURGICAL HISTORY: Past Surgical History:  Procedure Laterality Date  . ANTERIOR CERVICAL DECOMP/DISCECTOMY FUSION  12/20/2011   Procedure: ANTERIOR CERVICAL DECOMPRESSION/DISCECTOMY FUSION 2 LEVELS;  Surgeon: Mariam Dollar, MD;  Location: [facility];  Service: Neurosurgery;  Laterality: N/A;  Cervical four-five, five-six anterior cervical decompression/discectomy fusion  with plating  . BACK SURGERY     5 from degenerative disease..    . HIP SURGERY     rods to each hip- hip can ot of socket.  . TONSILLECTOMY    . WRIST SURGERY     repair due to crush    SOCIAL HISTORY: Social History   Social History  . Marital  status: Single    Spouse name: N/A  . Number of children: N/A  . Years of education: N/A   Occupational History  . Not on file.   Social History Main Topics  . Smoking status: Former Smoker    Packs/day: 0.50    Quit date: 09/22/2016  . Smokeless tobacco: Never Used  . Alcohol use No     Comment: occassional   . Drug use: No  . Sexual activity: Not on file   Other Topics Concern  . Not on file   Social History Narrative  . No narrative on file    FAMILY HISTORY: Family History  Problem Relation Age of Onset  . Diabetes Mellitus II Mother     ALLERGIES:  is allergic to darvocet [propoxyphene n-acetaminophen] and onion.  MEDICATIONS:  Current Facility-Administered Medications  Medication Dose Route Frequency Provider Last Rate Last Dose  . 0.9 %  sodium chloride infusion   Intravenous Continuous Eduard Clos, MD 50 mL/hr at 10/25/16 1045    . amLODipine (NORVASC) tablet 5 mg  5 mg Oral Daily Catarina Hartshorn, MD   5 mg at 10/25/16 1533  . buprenorphine-naloxone (SUBOXONE) 2-0.5 mg per SL tablet 2 tablet  2 tablet Sublingual BID Catarina Hartshorn, MD   2 tablet at 10/25/16 1222  . chlorhexidine (PERIDEX) 0.12 % solution 15 mL  15 mL Mouth Rinse BID Eduard Clos, MD   15 mL at 10/25/16 1045  . hydrALAZINE (APRESOLINE) injection 10 mg  10 mg Intravenous Q4H PRN Eduard Clos, MD   10 mg at 10/25/16 0315  . insulin aspart (novoLOG) injection 0-9 Units  0-9 Units Subcutaneous Q4H Eduard Clos, MD      . MEDLINE mouth rinse  15 mL Mouth Rinse q12n4p Eduard Clos, MD      . morphine 4 MG/ML injection 1 mg  1 mg Intravenous Q4H PRN Eduard Clos, MD   1 mg at 10/25/16 1223  . ondansetron (ZOFRAN) tablet 4 mg  4 mg Oral Q6H PRN Eduard Clos, MD       Or  . ondansetron [facility]) injection 4 mg  4 mg Intravenous Q6H PRN Eduard Clos, MD   4 mg at 10/25/16 0818  . pantoprazole (PROTONIX) injection 40 mg  40 mg Intravenous Q12H Eduard Clos,  MD   40 mg at 10/25/16 1045    REVIEW OF SYSTEMS:    10 Point review of Systems was done is negative except as noted above.  PHYSICAL EXAMINATION: ECOG PERFORMANCE STATUS: 1 - Symptomatic but completely ambulatory  . Vitals:   10/25/16 0604 10/25/16 1339  BP: (!) 146/88 132/86  Pulse: 62 62  Resp: 18 18  Temp: 99.2 F (37.3 C) 97.9 F (36.6 C)   Filed Weights   10/25/16 0235  Weight: 142 lb 4.8 oz (64.5 kg)   .Body mass index is 21.01 kg/m.  GENERAL:alert, in no acute distress and comfortable SKIN: no acute rashes, no significant lesions EYES: conjunctiva are pink and non-injected, sclera anicteric OROPHARYNX: MMM, no exudates, no oropharyngeal erythema or ulceration NECK: supple, no  JVD LYMPH:  no palpable lymphadenopathy in the cervical, axillary or inguinal regions LUNGS: clear to auscultation b/l with normal respiratory effort HEART: regular rate & rhythm ABDOMEN:  normoactive bowel sounds , mild nonfocal tenderness. No guarding rigidity or rebound. No peritoneal signs . Extremity: no pedal edema PSYCH: alert & oriented x 3 with fluent speech NEURO: no focal motor/sensory deficits  LABORATORY DATA:  I have reviewed the data as listed  . CBC Latest Ref Rng & Units 10/25/2016 10/24/2016 10/24/2016  WBC 4.0 - 10.5 K/uL 4.7 - 4.4  Hemoglobin 13.0 - 17.0 g/dL 16.1 - 09.6  Hematocrit 39.0 - 52.0 % 43.3 - 44.7  Platelets 150 - 400 K/uL 204 213 213    . CMP Latest Ref Rng & Units 10/25/2016 10/24/2016 10/24/2016  Glucose 65 - 99 mg/dL 94 045(W) 98  BUN 6 - 20 mg/dL 13 9 12   Creatinine 0.61 - 1.24 mg/dL 0.98 1.19 1.47  Sodium 135 - 145 mmol/L 136 136 136  Potassium 3.5 - 5.1 mmol/L 3.6 3.7 3.6  Chloride 101 - 111 mmol/L 102 101 104  CO2 22 - 32 mmol/L 24 24 24   Calcium 8.9 - 10.3 mg/dL 9.0 9.1 8.9  Total Protein 6.5 - 8.1 g/dL - 8.7(H) 8.0  Total Bilirubin 0.3 - 1.2 mg/dL - 0.5 0.4  Alkaline Phos 38 - 126 U/L - 58 54  AST 15 - 41 U/L - 19 18  ALT 17 - 63 U/L -  12(L) 11(L)   Component     Latest Ref Rng & Units 10/24/2016 10/25/2016  Prothrombin Time     11.4 - 15.2 seconds >90.0 (H) 30.4 (H)  INR      >10.00 (HH) 2.83  APTT     24 - 36 seconds 147 (H)   Fibrinogen     210 - 475 mg/dL 829   D-Dimer, Quant     0.00 - 0.50 ug/mL-FEU <0.27   Platelets     150 - 400 K/uL 213   Smear Review      NO SCHISTOCYTES SEEN      RADIOGRAPHIC STUDIES: I have personally reviewed the radiological images as listed and agreed with the findings in the report. Ct Abdomen Pelvis W Contrast  Result Date: 10/23/2016 CLINICAL DATA:  Nausea and abdominal pain EXAM: CT ABDOMEN AND PELVIS WITH CONTRAST TECHNIQUE: Multidetector CT imaging of the abdomen and pelvis was performed using the standard protocol following bolus administration of intravenous contrast. CONTRAST:  100 mL ISOVUE-300 IOPAMIDOL (ISOVUE-300) INJECTION 61% COMPARISON:  Ultrasound from earlier in the same day, 07/01/2016 FINDINGS: Lower chest: Minimal dependent atelectatic changes are noted. Hepatobiliary: The liver is diffusely decreased in attenuation consistent fatty infiltration. The gallbladder is well distended without focal abnormality. Pancreas: Unremarkable. No pancreatic ductal dilatation or surrounding inflammatory changes. Spleen: Spleen is within normal limits. Adrenals/Urinary Tract: Adrenal glands are unremarkable. Kidneys are normal, without renal calculi, focal lesion, or hydronephrosis. Bladder is decompressed. Stomach/Bowel: Scattered diverticular changes noted. No findings to suggest acute no diverticulitis is seen. Vascular/Lymphatic: Aortic atherosclerosis. No enlarged abdominal or pelvic lymph nodes. Reproductive: Prostate is unremarkable. Other: No abdominal wall hernia or abnormality. No abdominopelvic ascites. Musculoskeletal: Postsurgical changes are noted in the lumbar spine and bilateral proximal femurs. No acute abnormality is seen. IMPRESSION: Chronic changes as described above  stable from the previous exam. No acute abnormality noted. Electronically Signed   By: Alcide Clever M.D.   On: 10/23/2016 11:14   US Abdomen Limited Ruq  Result Date:  10/23/2016 CLINICAL DATA:  Abdominal pain with nausea and vomiting pain in pain History of systemic lupus erythematosus EXAM: US ABDOMEN LIMITED - RIGHT UPPER QUADRANT COMPARISON:  CT abdomen and pelvis July 01, 2016 FINDINGS: Gallbladder: No gallstones or wall thickening visualized. There is no pericholecystic fluid. No sonographic Murphy sign noted by sonographer. Common bile duct: Diameter: 3 mm. No intrahepatic or extrahepatic biliary duct dilatation. Liver: No focal lesion identified. Within normal limits in parenchymal echogenicity. Right kidney is noted to be somewhat echogenic. IMPRESSION: Somewhat echogenic right kidney. This is a finding that may be seen with medical renal disease. Appropriate laboratory correlation advised. Study otherwise unremarkable. Electronically Signed   By: Bretta Bang III M.D.   On: 10/23/2016 08:31    ASSESSMENT & PLAN:   51 year old African-American male with    #1 Severe coagulopathy with upper and lower GI bleeding and hematuria.  Patient had significantly elevated PT>90, PTT 419 with nl fibrinogen level of 419 and neg  D dimer. Patient received FFP and vitamin K 10 mg IV with improvement of PT to 30.  His history of likely exposure to D con rat poison (Brodifacoum) along with elevation of PT and PTT and evidence of his most vitamin K is consistent with super warfarin toxicity unless proven otherwise.  Patient reports no exposure to Coumadin and no evidence of using somebody else's medications.  He has good goal directed thoughts and appears concerned with his clinical presentation and suggests no suicidal ideation.  #2 Rectal bleeing #3 Hematuria #4 Likely Brodifacoum exposure. Plan -Discussed all the lab findings and his clinical presentation in details with the  patient. -Spent significant time trying to get in the history of medication/right poison exposures. In discussing with the patient at length he certainly seems to have had possible rat poison/D con exposure.  -This causes severe and sometimes life-threatening coagulopathy with elevation of PT and PTT such as the patient has. -This typically needs fairly high doses of vitamin K for reversal and doses could exceed 20-50 mg a day as opposed to what smaller doses needed for reversing warfarin. -Patient is received 10 mg vitamin K IV yesterday. He has received another 10 mg of vitamin K subcutaneously today ordered by me. -We shall placing a vitamin K 10 mg by mouth twice a day. The IV form tends to have a higher risk of anaphylactoid reactions. -Would recommend getting PT and PTT twice daily -Might need to increase daily vitamin K dosing depending on his trend of PT and PTT. -If the patient has acute severe  bleeding would recommend using prothrombin complex concentrate (Kcentra) 25-50 international units per kilogram to a maximum dose of 2000 international units. - could also use FFP's to control acute bleeding if needed. -If the patient has significant severe life-threatening bleeding including brain bleeds could also consider recombinant factor VIIs -Fall precautions and avoiding activities that could cause trauma due to high risk of bleeding. - I have ordered factor studies, thrombin time, rodenticide anticoagulant testing, PT and PTT mixing studies. -GI prophylaxis with PPIs twice a day . -Avoid NSAIDs . -Do not discharge the patient from the hospital until his PT and PTT can be well controlled on a defined dose of vitamin K . If this is confirmed to be Brodifacoum toxicity vitamin K replacement could be needed for several weeks as outpatient after discharge . - we will continue to follow. -Plan of care were discussed and the patient in details and he was very thankful for opinion .  Case was  discussed with Dr. Onalee Hua Tat MD  All of the patients questions were answered with apparent satisfaction. The patient knows to call the clinic with any problems, questions or concerns.  I spent 60 minutes counseling the patient face to face. The total time spent in the appointment was 80 minutes and more than 50% was on counseling and direct patient cares.    Wyvonnia Lora MD MS AAHIVMS [facility] Hematology/Oncology Physician [facility]  (Office):       [phone] (Work cell):  [phone] (Fax):           [phone]  10/25/2016 4:44 PM

## 2016-10-25 NOTE — H&P (Signed)
History and Physical    [name] ZOX:[id number] DOB: 14-Jun-1966 DOA: 10/24/2016  PCP: [facility]  Patient coming from: Home.    Chief Complaint: Abdominal pain and bleeding.  HPI: [name] is a 51 y.o. male with history of diabetes mellitus type 2, hypertension, COPD and chronic pain presents with the ER with complaints of persistent pain in the abdomen with nausea vomiting and rectal bleeding. Patient's symptoms started 3 days ago and has that time had CT of the abdomen and pelvis and sonogram of abdomen which did not show anything acute. Patient states that he started having gum bleeds. Abdominal pain is mostly crampy diffuse. Denies any recent travel or sick contacts. Has been having night sweats. Also has recently lost weight. Denies drinking alcohol. Patient also started developing ulcers in the mouth.  ED Course: In the ER patient's INR was found to be more than 10 which was as per ER physician repeated twice. Platelets were normal. Hemoglobin and platelets were within acceptable limits. No leukocytosis. Patient on exam has hematuria. Stool for occult blood was negative. Patient was given vitamin K 10 mg IV.  Review of Systems: As per HPI, rest all negative.   Past Medical History:  Diagnosis Date  . Complication of anesthesia   . COPD (chronic obstructive pulmonary disease) (HCC)    chronic Bronchhititis  . Diabetes mellitus   . Hypertension   . 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  . Pneumonia   . Seizures (HCC)    last one 3 years ago.  Md discontinued meds- Dr Anne Hahn    Past Surgical History:  Procedure Laterality Date  . ANTERIOR CERVICAL DECOMP/DISCECTOMY FUSION  12/20/2011   Procedure: ANTERIOR CERVICAL DECOMPRESSION/DISCECTOMY FUSION 2 LEVELS;  Surgeon: Mariam Dollar, MD;  Location: [facility];  Service: Neurosurgery;  Laterality: N/A;  Cervical four-five, five-six anterior cervical decompression/discectomy fusion with  plating  . BACK SURGERY     5 from degenerative disease..    . HIP SURGERY     rods to each hip- hip can ot of socket.  . TONSILLECTOMY    . WRIST SURGERY     repair due to crush     reports that he quit smoking about 4 weeks ago. He smoked 0.50 packs per day. He has never used smokeless tobacco. He reports that he does not drink alcohol or use drugs.  Allergies  Allergen Reactions  . Darvocet [Propoxyphene N-Acetaminophen] Itching and Nausea And Vomiting  . Onion Itching    Family History  Problem Relation Age of Onset  . Diabetes Mellitus II Mother     Prior to Admission medications   Medication Sig Start Date End Date Taking? Authorizing Provider  albuterol (PROVENTIL HFA;VENTOLIN HFA) 108 (90 Base) MCG/ACT inhaler Inhale 2 puffs into the lungs every 6 (six) hours as needed for wheezing or shortness of breath.   Yes Historical Provider, MD  Fluticasone-Salmeterol (ADVAIR) 250-50 MCG/DOSE AEPB Inhale 1 puff into the lungs 2 (two) times daily.   Yes Historical Provider, MD  omeprazole (PRILOSEC) 20 MG capsule Take 1 capsule (20 mg total) by mouth daily. 10/23/16 11/06/16 Yes Shaune Pollack, MD  ranitidine (ZANTAC) 150 MG tablet Take 1 tablet (150 mg total) by mouth 2 (two) times daily. 10/23/16 11/06/16 Yes Shaune Pollack, MD  sucralfate (CARAFATE) 1 GM/10ML suspension Take 10 mLs (1 g total) by mouth 4 (four) times daily -  with meals and at bedtime. 10/23/16  Yes Shaune Pollack, MD  albuterol (PROVENTIL HFA;VENTOLIN HFA) 108 (90 Base) MCG/ACT inhaler Inhale 2 puffs into the lungs every 4 (four) hours as needed for wheezing or shortness of breath. Patient not taking: Reported on 10/23/2016 09/08/15   Hayden Rasmussen, NP  levofloxacin (LEVAQUIN) 750 MG tablet Take 1 tablet (750 mg total) by mouth daily. X 7 days Patient not taking: Reported on 10/23/2016 07/04/16   Tomasita Crumble, MD  metoCLOPramide (REGLAN) 10 MG tablet Take 1 tablet (10 mg total) by mouth every 8 (eight) hours as needed for nausea  (nausea/headache). Patient not taking: Reported on 10/24/2016 10/24/16   Tomasita Crumble, MD  ondansetron (ZOFRAN) 4 MG tablet Take 1 tablet (4 mg total) by mouth every 8 (eight) hours as needed for nausea or vomiting. Patient not taking: Reported on 10/23/2016 06/29/16   Shaune Pollack, MD  promethazine (PHENERGAN) 25 MG tablet Take 1 tablet (25 mg total) by mouth every 6 (six) hours as needed for nausea or vomiting. Patient not taking: Reported on 10/23/2016 07/02/16   Renne Crigler, PA-C    Physical Exam: Vitals:   10/24/16 2255 10/24/16 2351 10/25/16 0028 10/25/16 0100  BP: (!) 148/95 (!) 158/105 (!) 180/88 (!) 159/86  Pulse: 64 (!) 59 (!) 50 75  Resp: (!) 21 (!) Temp:      TempSrc:      SpO2: 99% 98% 99% 100%      Constitutional: Moderately built and nourished. Vitals:   10/24/16 2255 10/24/16 2351 10/25/16 0028 10/25/16 0100  BP: (!) 148/95 (!) 158/105 (!) 180/88 (!) 159/86  Pulse: 64 (!) 59 (!) 50 75  Resp: (!) 21 (!) Temp:      TempSrc:      SpO2: 99% 98% 99% 100%   Eyes: Anicteric no pallor. ENMT: Ulcerations of the mouth. Neck: No neck rigidity no mass felt. Respiratory: No rhonchi or crepitations. Cardiovascular: S1-S2 heard no murmurs appreciated. Abdomen: Soft nontender bowel sounds present.  Musculoskeletal: No edema. Skin: No rash. Skin appears warm. Neurologic: Alert awake oriented to time place and person. Moves all extremities. Psychiatric: Appears normal. Normal affect.   Labs on Admission: I have personally reviewed following labs and imaging studies  CBC:  Recent Labs Lab 10/23/16 0526 10/24/16 0239 10/24/16 1841 10/24/16 2137  WBC 5.1 5.3 4.4  --   HGB 15.2 15.0 15.3  --   HCT 45.7 44.0 44.7  --   MCV 81.0 80.9 78.3  --   PLT 240 209 213 213   Basic Metabolic Panel:  Recent Labs Lab 10/23/16 0526 10/24/16 0239 10/24/16 1841  NA 136 136 136  K 3.8 3.6 3.7  CL 104 104 101  CO2 GLUCOSE 95 98 102*  BUN CREATININE 1.02 0.92 0.97  CALCIUM 8.9 8.9 9.1   GFR: Estimated Creatinine Clearance: 90.1 mL/min (by C-G formula based on SCr of 0.97 mg/dL). Liver Function Tests:  Recent Labs Lab 10/23/16 0526 10/24/16 0239 10/24/16 1841  AST ALT 10* 11* 12*  ALKPHOS 57 54 58  BILITOT 0.3 0.4 0.5  PROT 7.8 8.0 8.7*  ALBUMIN 3.9 4.1 4.4    Recent Labs Lab 10/23/16 0526 10/24/16 0239 10/24/16 1841  LIPASE 28 33 63*   No results for input(s): AMMONIA in the last 168 hours. Coagulation Profile:  Recent Labs Lab 10/24/16 2137  INR >10.00*   Cardiac Enzymes:  Recent Labs Lab 10/24/16  2027  CKTOTAL 179   BNP (last 3 results) No results for input(s): PROBNP in the last 8760 hours. HbA1C: No results for input(s): HGBA1C in the last 72 hours. CBG: No results for input(s): GLUCAP in the last 168 hours. Lipid Profile: No results for input(s): CHOL, HDL, LDLCALC, TRIG, CHOLHDL, LDLDIRECT in the last 72 hours. Thyroid Function Tests: No results for input(s): TSH, T4TOTAL, FREET4, T3FREE, THYROIDAB in the last 72 hours. Anemia Panel: No results for input(s): VITAMINB12, FOLATE, FERRITIN, TIBC, IRON, RETICCTPCT in the last 72 hours. Urine analysis:    Component Value Date/Time   COLORURINE RED (A) 10/24/2016 1831   APPEARANCEUR TURBID (A) 10/24/2016 1831   LABSPEC 1.019 10/24/2016 1831   PHURINE 5.0 10/24/2016 1831   GLUCOSEU 50 (A) 10/24/2016 1831   HGBUR MODERATE (A) 10/24/2016 1831   BILIRUBINUR NEGATIVE 10/24/2016 1831   KETONESUR 5 (A) 10/24/2016 1831   PROTEINUR 100 (A) 10/24/2016 1831   UROBILINOGEN 1.0 03/20/2014 2351   NITRITE POSITIVE (A) 10/24/2016 1831   LEUKOCYTESUR NEGATIVE 10/24/2016 1831   Sepsis Labs: (procalcitonin:4,lacticidven:4) )No results found for this or any previous visit (from the past 240 hour(s)).   Radiological Exams on Admission: Ct Abdomen Pelvis W Contrast  Result Date: 10/23/2016 CLINICAL DATA:  Nausea and  abdominal pain EXAM: CT ABDOMEN AND PELVIS WITH CONTRAST TECHNIQUE: Multidetector CT imaging of the abdomen and pelvis was performed using the standard protocol following bolus administration of intravenous contrast. CONTRAST:  100 mL ISOVUE-300 IOPAMIDOL (ISOVUE-300) INJECTION 61% COMPARISON:  Ultrasound from earlier in the same day, 07/01/2016 FINDINGS: Lower chest: Minimal dependent atelectatic changes are noted. Hepatobiliary: The liver is diffusely decreased in attenuation consistent fatty infiltration. The gallbladder is well distended without focal abnormality. Pancreas: Unremarkable. No pancreatic ductal dilatation or surrounding inflammatory changes. Spleen: Spleen is within normal limits. Adrenals/Urinary Tract: Adrenal glands are unremarkable. Kidneys are normal, without renal calculi, focal lesion, or hydronephrosis. Bladder is decompressed. Stomach/Bowel: Scattered diverticular changes noted. No findings to suggest acute no diverticulitis is seen. Vascular/Lymphatic: Aortic atherosclerosis. No enlarged abdominal or pelvic lymph nodes. Reproductive: Prostate is unremarkable. Other: No abdominal wall hernia or abnormality. No abdominopelvic ascites. Musculoskeletal: Postsurgical changes are noted in the lumbar spine and bilateral proximal femurs. No acute abnormality is seen. IMPRESSION: Chronic changes as described above stable from the previous exam. No acute abnormality noted. Electronically Signed   By: Alcide Clever M.D.   On: 10/23/2016 11:14   US Abdomen Limited Ruq  Result Date: 10/23/2016 CLINICAL DATA:  Abdominal pain with nausea and vomiting pain in pain History of systemic lupus erythematosus EXAM: US ABDOMEN LIMITED - RIGHT UPPER QUADRANT COMPARISON:  CT abdomen and pelvis July 01, 2016 FINDINGS: Gallbladder: No gallstones or wall thickening visualized. There is no pericholecystic fluid. No sonographic Murphy sign noted by sonographer. Common bile duct: Diameter: 3 mm. No intrahepatic  or extrahepatic biliary duct dilatation. Liver: No focal lesion identified. Within normal limits in parenchymal echogenicity. Right kidney is noted to be somewhat echogenic. IMPRESSION: Somewhat echogenic right kidney. This is a finding that may be seen with medical renal disease. Appropriate laboratory correlation advised. Study otherwise unremarkable. Electronically Signed   By: Bretta Bang III M.D.   On: 10/23/2016 08:31     Assessment/Plan Principal Problem:   Coagulopathy (HCC) Active Problems:   Hematemesis   Essential hypertension   Diabetes mellitus type 2, controlled (HCC)    1. Coagulopathy with bleeding and abdominal pain with mouth ulcers - discussed with on-call hematologist Dr.  Zhou. Dr. Janyth Contes at this time advised in addition to the vitamin K 10 mg IV 1 unit of FFP and thinks that patient probably has liver problems. Will check acute hepatitis panel. If bleeding continues to be consult hematologist in a.m. We will recheck INR and CBC again in a.m. Patient's chart also mentions patient has history of lupus. Will check ANA anti-double-stranded DNA C3-C4 levels. For now I will keep patient nothing by mouth. Will also keep patient on Protonix. Follow HIV status. 2. Hypertension - since patient is nothing by mouth I have placed patient on when necessary IV hydralazine. 3. History of diabetes mellitus - we'll keep patient on sliding scale coverage. 4. COPD - not actively wheezing. 5. History of chronic pain on Suboxone.   DVT prophylaxis: SCDs. Code Status: Full code.  Family Communication: Discussed with patient.  Disposition Plan: Home.  Consults called: Discussed with hematologist.  Admission status: Inpatient.    Eduard Clos MD [facility] Pager [phone].  If 7PM-7AM, please contact night-coverage [email or web address] Password [email or web address]  10/25/2016, 1:36 AM

## 2016-10-26 ENCOUNTER — Inpatient Hospital Stay (HOSPITAL_COMMUNITY): Payer: Medicare Other

## 2016-10-26 DIAGNOSIS — T604X1D Toxic effect of rodenticides, accidental (unintentional), subsequent encounter: Secondary | ICD-10-CM

## 2016-10-26 DIAGNOSIS — R3129 Other microscopic hematuria: Secondary | ICD-10-CM

## 2016-10-26 LAB — PREPARE FRESH FROZEN PLASMA
UNIT DIVISION: 0
Unit division: 0

## 2016-10-26 LAB — PROTIME-INR
INR: 5.15 — AB
INR: 5.37 — AB
PROTHROMBIN TIME: 49 s — AB (ref 11.4–15.2)
PROTHROMBIN TIME: 50.7 s — AB (ref 11.4–15.2)

## 2016-10-26 LAB — C4 COMPLEMENT: Complement C4, Body Fluid: 19 mg/dL (ref 14–44)

## 2016-10-26 LAB — BPAM FFP
BLOOD PRODUCT EXPIRATION DATE: 201804302359
Blood Product Expiration Date: 201804302359
ISSUE DATE / TIME: 201804250418
ISSUE DATE / TIME: 201804252138
UNIT TYPE AND RH: 5100
Unit Type and Rh: 5100

## 2016-10-26 LAB — CBC
HCT: 41 % (ref 39.0–52.0)
HCT: 43.1 % (ref 39.0–52.0)
HEMOGLOBIN: 13.4 g/dL (ref 13.0–17.0)
Hemoglobin: 14.1 g/dL (ref 13.0–17.0)
MCH: 25.9 pg — ABNORMAL LOW (ref 26.0–34.0)
MCH: 26.9 pg (ref 26.0–34.0)
MCHC: 32.7 g/dL (ref 30.0–36.0)
MCHC: 32.7 g/dL (ref 30.0–36.0)
MCV: 79.2 fL (ref 78.0–100.0)
MCV: 82.3 fL (ref 78.0–100.0)
PLATELETS: 192 10*3/uL (ref 150–400)
PLATELETS: 218 10*3/uL (ref 150–400)
RBC: 5.18 MIL/uL (ref 4.22–5.81)
RBC: 5.24 MIL/uL (ref 4.22–5.81)
RDW: 14.1 % (ref 11.5–15.5)
RDW: 14.1 % (ref 11.5–15.5)
WBC: 4.5 10*3/uL (ref 4.0–10.5)
WBC: 4.6 10*3/uL (ref 4.0–10.5)

## 2016-10-26 LAB — PTT FACTOR INHIBITOR (MIXING STUDY)
APTT 1 1 NORMAL PLASMA: 29.8 s (ref 22.9–30.2)
APTT 11 NP INCUB. MIX CTL: 31 s — AB (ref 22.9–30.2)
aPTT 1:1 Mix Saline: 87.3 s
aPTT 1:1 NP Mix, 60 Min,Incub.: 31.9 s — ABNORMAL HIGH (ref 22.9–30.2)
aPTT: 56.3 s — ABNORMAL HIGH (ref 22.9–30.2)

## 2016-10-26 LAB — PT FACTOR INHIBITOR (MIXING STUDY)
1 HR INCUB PT 1:1NP: 12.2 s — ABNORMAL HIGH (ref 9.6–11.5)
PT 1:1NP: 11.7 s — ABNORMAL HIGH (ref 9.6–11.5)
PT: 41.7 s — ABNORMAL HIGH (ref 9.6–11.5)

## 2016-10-26 LAB — GLUCOSE, CAPILLARY
GLUCOSE-CAPILLARY: 200 mg/dL — AB (ref 65–99)
GLUCOSE-CAPILLARY: 242 mg/dL — AB (ref 65–99)
Glucose-Capillary: 104 mg/dL — ABNORMAL HIGH (ref 65–99)
Glucose-Capillary: 105 mg/dL — ABNORMAL HIGH (ref 65–99)
Glucose-Capillary: 111 mg/dL — ABNORMAL HIGH (ref 65–99)
Glucose-Capillary: 103 mg/dL — ABNORMAL HIGH (ref 65–99)
Glucose-Capillary: 101 mg/dL — ABNORMAL HIGH (ref 65–99)

## 2016-10-26 LAB — ANA W/REFLEX IF POSITIVE: ANA: NEGATIVE

## 2016-10-26 LAB — APTT
APTT: 77 s — AB (ref 24–36)
aPTT: 81 seconds — ABNORMAL HIGH (ref 24–36)

## 2016-10-26 LAB — C3 COMPLEMENT: C3 COMPLEMENT: 123 mg/dL (ref 82–167)

## 2016-10-26 LAB — HEPATITIS PANEL, ACUTE
HEP B C IGM: NEGATIVE
HEP B S AG: NEGATIVE
Hep A IgM: NEGATIVE

## 2016-10-26 LAB — HEMOGLOBIN A1C
HEMOGLOBIN A1C: 5.8 % — AB (ref 4.8–5.6)
Mean Plasma Glucose: 120 mg/dL

## 2016-10-26 MED ORDER — PHYTONADIONE 5 MG PO TABS
10.0000 mg | ORAL_TABLET | Freq: Two times a day (BID) | ORAL | Status: DC
  Administered 2016-10-26 (×2): 10 mg via ORAL
  Filled 2016-10-26 (×3): qty 2

## 2016-10-26 MED ORDER — INSULIN ASPART 100 UNIT/ML ~~LOC~~ SOLN
0.0000 [IU] | Freq: Three times a day (TID) | SUBCUTANEOUS | Status: DC
  Administered 2016-10-26 – 2016-10-28 (×2): 2 [IU] via SUBCUTANEOUS
  Administered 2016-10-28 – 2016-10-30 (×3): 1 [IU] via SUBCUTANEOUS

## 2016-10-26 MED ORDER — VITAMIN K1 10 MG/ML IJ SOLN
10.0000 mg | Freq: Three times a day (TID) | INTRAMUSCULAR | Status: DC
  Administered 2016-10-26 (×2): 10 mg via SUBCUTANEOUS
  Filled 2016-10-26 (×3): qty 1

## 2016-10-26 MED ORDER — VITAMIN K1 10 MG/ML IJ SOLN
10.0000 mg | Freq: Three times a day (TID) | INTRAMUSCULAR | Status: DC
  Administered 2016-10-26 – 2016-10-28 (×8): 10 mg via SUBCUTANEOUS
  Filled 2016-10-26 (×9): qty 1

## 2016-10-26 MED ORDER — ADULT MULTIVITAMIN W/MINERALS CH
1.0000 | ORAL_TABLET | Freq: Every day | ORAL | Status: DC
  Administered 2016-10-26 – 2016-10-30 (×5): 1 via ORAL
  Filled 2016-10-26 (×5): qty 1

## 2016-10-26 MED ORDER — ENSURE ENLIVE PO LIQD
237.0000 mL | Freq: Two times a day (BID) | ORAL | Status: DC
  Administered 2016-10-26 – 2016-10-30 (×9): 237 mL via ORAL

## 2016-10-26 NOTE — Progress Notes (Signed)
Patient c/o a new area to his left flank/ side area, right above left hip bone. Area is raised and round in shape, about the size of a fist. Very tender to touch. Area is very well visible when patient is standing. Pt states he just noticed this area when he attempted to lay on this side in his bed. MD Tat paged to be made aware- awaiting response.

## 2016-10-26 NOTE — Progress Notes (Signed)
CRITICAL VALUE ALERT  Critical value received:  INR 5.37  Date of notification:  10/26/16  Time of notification:  1921  Critical value read back:Yes.    Nurse who received alert:  Phillis Knack, RN  MD notified (1st page):  Craige Cotta  Time of first page:  1930  MD notified (2nd page):  Time of second page:  Responding MD:   Time MD responded:

## 2016-10-26 NOTE — Progress Notes (Signed)
[facility]   HEMATOLOGY/ONCOLOGY INPATIENT PROGRESS NOTE  Date of Service: 10/26/2016  Inpatient Attending: .Catarina Hartshorn, MD   SUBJECTIVE  Patient was seen this morning. Notes no hematemesis. Minimal nausea. Able to tolerate oral intake. No new bowel movements. No rectal bleeding. Still notes somewhat pink tinged urine without overt blood clots. No other overt bleeding. No epistaxis. No complaints. Has been tolerating the vitamin K without any issues. Coagulopathy labs including factor assays, mixing studies, rodenticide anticoagulant testing are currently all pending. We discussed with the patient about keeping his dog safe so that he does not have exposure to the rrodenticide since this could kill him. We discussed that he will likely need high doses of vitamin K to try to keep his INR under 4 to reduce his risk of significant bleeding. It is quite possible that he might need to have high doses of vitamin K for several weeks.   OBJECTIVE:  NAD  PHYSICAL EXAMINATION: . Vitals:   10/25/16 2141 10/25/16 2209 10/25/16 2310 10/26/16 0438  BP: 135/86 129/74 (!) 144/76 (!) 146/95  Pulse: 66 72 75 (!) 52  Resp: Temp: 98.1 F (36.7 C) 98 F (36.7 C) 98.2 F (36.8 C) 98.5 F (36.9 C)  TempSrc: Oral Oral Oral Oral  SpO2: 100% 100% 100% 94%  Weight:      Height:       Filed Weights   10/25/16 0235  Weight: 142 lb 4.8 oz (64.5 kg)   .Body mass index is 21.01 kg/m.  GENERAL:alert, in no acute distress and comfortable SKIN: skin color, texture, turgor are normal, no rashes or significant lesions EYES: normal, conjunctiva are pink and non-injected, sclera clear OROPHARYNX:no exudate, no erythema and lips, buccal mucosa, and tongue normal  NECK: supple, no JVD, thyroid normal size, non-tender, without nodularity LYMPH:  no palpable lymphadenopathy in the cervical, axillary or inguinal LUNGS: clear to auscultation with normal respiratory effort HEART: regular rate & rhythm,   no murmurs and no lower extremity edema ABDOMEN: abdomen soft, non-tender, normoactive bowel sounds  Musculoskeletal: no cyanosis of digits and no clubbing  PSYCH: alert & oriented x 3 with fluent speech NEURO: no focal motor/sensory deficits  MEDICAL HISTORY:  Past Medical History:  Diagnosis Date  . Complication of anesthesia   . COPD (chronic obstructive pulmonary disease) (HCC)    chronic Bronchhititis  . Diabetes mellitus   . Hypertension   . 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  . Pneumonia   . Seizures (HCC)    last one 3 years ago.  Md discontinued meds- Dr Anne Hahn    SURGICAL HISTORY: Past Surgical History:  Procedure Laterality Date  . ANTERIOR CERVICAL DECOMP/DISCECTOMY FUSION  12/20/2011   Procedure: ANTERIOR CERVICAL DECOMPRESSION/DISCECTOMY FUSION 2 LEVELS;  Surgeon: Mariam Dollar, MD;  Location: [facility];  Service: Neurosurgery;  Laterality: N/A;  Cervical four-five, five-six anterior cervical decompression/discectomy fusion with plating  . BACK SURGERY     5 from degenerative disease..    . HIP SURGERY     rods to each hip- hip can ot of socket.  . TONSILLECTOMY    . WRIST SURGERY     repair due to crush    SOCIAL HISTORY: Social History   Social History  . Marital status: Single    Spouse name: N/A  . Number of children: N/A  . Years of education: N/A   Occupational History  . Not on file.   Social History  Main Topics  . Smoking status: Former Smoker    Packs/day: 0.50    Quit date: 09/22/2016  . Smokeless tobacco: Never Used  . Alcohol use No     Comment: occassional   . Drug use: No  . Sexual activity: Not on file   Other Topics Concern  . Not on file   Social History Narrative  . No narrative on file    FAMILY HISTORY: Family History  Problem Relation Age of Onset  . Diabetes Mellitus II Mother     ALLERGIES:  is allergic to darvocet [propoxyphene n-acetaminophen] and onion.  MEDICATIONS:  Scheduled Meds: .  amLODipine  5 mg Oral Daily  . buprenorphine-naloxone  2 tablet Sublingual BID  . chlorhexidine  15 mL Mouth Rinse BID  . feeding supplement (ENSURE ENLIVE)  237 mL Oral BID BM  . insulin aspart  0-9 Units Subcutaneous TID WC  . mouth rinse  15 mL Mouth Rinse q12n4p  . multivitamin with minerals  1 tablet Oral Daily  . pantoprazole (PROTONIX) IV  40 mg Intravenous Q12H  . phytonadione  10 mg Subcutaneous TID AC & HS  . phytonadione  10 mg Oral BID   Continuous Infusions: PRN Meds:.hydrALAZINE, morphine injection, ondansetron **OR** ondansetron (ZOFRAN) IV  REVIEW OF SYSTEMS:    10 Point review of Systems was done is negative except as noted above.   LABORATORY DATA:  I have reviewed the data as listed  . CBC Latest Ref Rng & Units 10/26/2016 10/25/2016 10/24/2016  WBC 4.0 - 10.5 K/uL 4.5 4.7 -  Hemoglobin 13.0 - 17.0 g/dL 40.9 81.1 -  Hematocrit 39.0 - 52.0 % 41.0 43.3 -  Platelets 150 - 400 K/uL 192 204 213    . CMP Latest Ref Rng & Units 10/25/2016 10/24/2016 10/24/2016  Glucose 65 - 99 mg/dL 94 914(N) 98  BUN 6 - 20 mg/dL Creatinine 0.61 - 1.24 mg/dL 8.29 5.62 1.30  Sodium 135 - 145 mmol/L 136 136 136  Potassium 3.5 - 5.1 mmol/L 3.6 3.7 3.6  Chloride 101 - 111 mmol/L 102 101 104  CO2 22 - 32 mmol/L Calcium 8.9 - 10.3 mg/dL 9.0 9.1 8.9  Total Protein 6.5 - 8.1 g/dL - 8.7(H) 8.0  Total Bilirubin 0.3 - 1.2 mg/dL - 0.5 0.4  Alkaline Phos 38 - 126 U/L - 58 54  AST 15 - 41 U/L - 19 18  ALT 17 - 63 U/L - 12(L) 11(L)    RADIOGRAPHIC STUDIES: I have personally reviewed the radiological images as listed and agreed with the findings in the report. Ct Abdomen Pelvis W Contrast  Result Date: 10/23/2016 CLINICAL DATA:  Nausea and abdominal pain EXAM: CT ABDOMEN AND PELVIS WITH CONTRAST TECHNIQUE: Multidetector CT imaging of the abdomen and pelvis was performed using the standard protocol following bolus administration of intravenous contrast. CONTRAST:  100 mL  ISOVUE-300 IOPAMIDOL (ISOVUE-300) INJECTION 61% COMPARISON:  Ultrasound from earlier in the same day, 07/01/2016 FINDINGS: Lower chest: Minimal dependent atelectatic changes are noted. Hepatobiliary: The liver is diffusely decreased in attenuation consistent fatty infiltration. The gallbladder is well distended without focal abnormality. Pancreas: Unremarkable. No pancreatic ductal dilatation or surrounding inflammatory changes. Spleen: Spleen is within normal limits. Adrenals/Urinary Tract: Adrenal glands are unremarkable. Kidneys are normal, without renal calculi, focal lesion, or hydronephrosis. Bladder is decompressed. Stomach/Bowel: Scattered diverticular changes noted. No findings to suggest acute no diverticulitis is seen. Vascular/Lymphatic: Aortic atherosclerosis. No enlarged abdominal or pelvic  lymph nodes. Reproductive: Prostate is unremarkable. Other: No abdominal wall hernia or abnormality. No abdominopelvic ascites. Musculoskeletal: Postsurgical changes are noted in the lumbar spine and bilateral proximal femurs. No acute abnormality is seen. IMPRESSION: Chronic changes as described above stable from the previous exam. No acute abnormality noted. Electronically Signed   By: Alcide Clever M.D.   On: 10/23/2016 11:14   US Abdomen Limited Ruq  Result Date: 10/23/2016 CLINICAL DATA:  Abdominal pain with nausea and vomiting pain in pain History of systemic lupus erythematosus EXAM: US ABDOMEN LIMITED - RIGHT UPPER QUADRANT COMPARISON:  CT abdomen and pelvis July 01, 2016 FINDINGS: Gallbladder: No gallstones or wall thickening visualized. There is no pericholecystic fluid. No sonographic Murphy sign noted by sonographer. Common bile duct: Diameter: 3 mm. No intrahepatic or extrahepatic biliary duct dilatation. Liver: No focal lesion identified. Within normal limits in parenchymal echogenicity. Right kidney is noted to be somewhat echogenic. IMPRESSION: Somewhat echogenic right kidney. This is a  finding that may be seen with medical renal disease. Appropriate laboratory correlation advised. Study otherwise unremarkable. Electronically Signed   By: Bretta Bang III M.D.   On: 10/23/2016 08:31    ASSESSMENT & PLAN:   51 year old African-American male with    #1 Severe coagulopathy with upper and lower GI bleeding and hematuria.  Patient had significantly elevated PT>90, PTT 419 with nl fibrinogen level of 419 and neg  D dimer. Patient received FFP and vitamin K 10 mg IV with improvement of PT to 30.  His history of likely exposure to D con rat poison (Brodifacoum) along with elevation of PT and PTT and evidence of his most vitamin K is consistent with super warfarin toxicity unless proven otherwise.  Patient reports no exposure to Coumadin and no evidence of using somebody else's medications or other anticoagulants.  #2 Rectal bleeing- resolved at this time #3 Hematuria - still with some pinkish urine but no overt blood clots #4 Likely Brodifacoum exposure. Plan -Inadequate PT/INR correction with current dose of vitamin K yesterday (got 20 mg subcutaneously in 10 mg orally) -Goal is to try to keep his INR under 4 and to determine the dose of vitamin K that will help achieve this incomplete reversibility of his super warfarin-related coagulopathy. -We will increase his dose to 60 mg daily of vitamin K. Of this 40 mg will be subcutaneous vitamin K and 20 mg would be oral. -Once we know the total daily requirements will switch that all to oral vitamin K and will have to ensure stability of coags and no clinical bleeding for 24-48 hours prior to considering discharge. -No intramuscular injections --Would recommend getting PT and PTT twice daily to help adjust vitamin K dose. -If the patient has acute severe  bleeding would recommend using prothrombin complex concentrate (Kcentra) 25-50 international units per kilogram to a maximum dose of 2000 international units. - could  also use FFP's to control acute bleeding if needed. -If the patient has significant severe life-threatening bleeding including brain bleeds could also consider recombinant factor VIIa -Fall precautions and avoiding activities that could cause trauma due to high risk of bleeding. - I have ordered factor studies, thrombin time, rodenticide anticoagulant testing, PT and PTT mixing studies and factor assays to confirm diagnosis -all of these are still pending and might not return in time for clinical decision making . -It will be important for the social worker to confirm the cost of oral vitamin K based on the patient's insurance since this can be  quite costly and difficult to cover and is an important element in his discharge planning. -GI prophylaxis with PPIs twice a day . -Avoid NSAIDs . -Do not discharge the patient from the hospital until his PT and PTT can be well controlled on a defined dose of vitamin K . If this is confirmed to be Brodifacoum toxicity vitamin K replacement could be needed for several weeks as outpatient after discharge . - we will continue to follow.   I spent 30 minutes counseling the patient face to face. The total time spent in the appointment was 40 minutes and more than 50% was on counseling and direct patient cares.    Wyvonnia Lora MD MS AAHIVMS [facility] Hematology/Oncology Physician [facility]  (Office):       [phone] (Work cell):  [phone] (Fax):           [phone]  10/26/2016 11:47 AM

## 2016-10-26 NOTE — Progress Notes (Addendum)
PROGRESS NOTE  [name] JJO:[id number] DOB: 1966-03-21 DOA: 10/24/2016 PCP: [facility]  Brief History:  50 y.o.malewith history of diabetes mellitus type 2, hypertension, COPD and chronic pain presents with the ER with complaints of persistent pain in the abdomen with nausea vomiting and rectal bleeding of 2-3 day duration.  Patient also complains of bleeding gums and hematuria for the past 2-3 days. Patient also complained of abdominal pain. He visited the emergency department on 10/23/2016. CT of the abdomen and pelvis at that time was negative for any acute abnormalities. Fatty liver was noted. Since admission, the patient states that his emesis has improved and feels hungry. He states that his abdominal pain has also improved since admission. His last bowel movement was on 10/23/2016. He was noted to have a coagulopathy with INR greater than 10 and PTT 147. Urinalysis also showed TNTC RBC.  Notably, the patient states that he has been handling "D-Con" to try to get rid of rats in his backyard. 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 Candise Che -1 unit FFP and vitamin K 10mg  IV given initially -INR continues to trend up -still has some bleeding gums and hematuria -FOBT--negative -increasing vitamin K to 60 mg daily (40 mg Babb, 20 mg po) per hematology recommendation -recheck INR, PTT daily -(Kcentra)25-50 international units per kilogram for severe bleeding  Hematuria -10/23/16 CT abd/pelvis with contrast--no acute findings -appreciate urology consult -?coagulopathy unmasking underlying bladder issue? -UA with TNTC RBC on 4/23  ?Rectal bleeding -FOBT negative on 4/24 -repeat FOBT  Pyuria -pt without dysuria -repeat UA--pyuria resolved  Diabetes  mellitus  -check A1C--5.8 -novolog sliding scale -holding metformin and amaryl  HTN -not on any medications outpt -start amlodipine  COPD -stable on RA  Chronic pain syndrome -continue home dose suboxone -I have reviewed NCSRS data base--pt getting only from one source  Moderate malnutrition -continue supplements  anticoagulant rodenticide    Disposition Plan:   Home in 3-4 days  Family Communication:   No Family at bedside  Consultants:  Hematology--Kale; Urology --McDiarmid  Code Status:  FULL  DVT Prophylaxis:  SCDs Total time spent 35 minutes.  Greater than 50% spent face to face counseling and coordinating care.   Procedures: As Listed in Progress Note Above  Antibiotics: None    Subjective: Patient denies any hematochezia, melena, hematemesis. He still has some bleeding gums and hematuria but states that this is improving. Denies any headache, neck pain, chest pain, chest breath. He has intermittent abdominal pain. He is able to tolerate a diet.  Objective: Vitals:   10/25/16 2209 10/25/16 2310 10/26/16 0438 10/26/16 1506  BP: 129/74 (!) 144/76 (!) 146/95 134/85  Pulse: 72 75 (!) 52 72  Resp: 18 18 18 19   Temp: 98 F (36.7 C) 98.2 F (36.8 C) 98.5 F (36.9 C) 98.9 F (37.2 C)  TempSrc: Oral Oral Oral Oral  SpO2: 100% 100% 94% 98%  Weight:      Height:        Intake/Output Summary (Last 24 hours) at 10/26/16 1530 Last data filed at 10/26/16 1325  Gross per 24 hour  Intake             1485 ml  Output             1250  ml  Net              235 ml   Weight change:  Exam:   General:  Pt is alert, follows commands appropriately, not in acute distress  HEENT: No icterus, No thrush, No neck mass, [place]/AT  Cardiovascular: RRR, S1/S2, no rubs, no gallops  Respiratory: CTA bilaterally, no wheezing, no crackles, no rhonchi  Abdomen: Soft/+BS, epigastric tender, non distended, no guarding  Extremities: No edema, No lymphangitis, No  petechiae, No rashes, no synovitis   Data Reviewed: I have personally reviewed following labs and imaging studies Basic Metabolic Panel:  Recent Labs Lab 10/23/16 0526 10/24/16 0239 10/24/16 1841 10/25/16 0518  NA 136 136 136 136  K 3.8 3.6 3.7 3.6  CL 104 104 101 102  CO2 24 24 24 24   GLUCOSE 95 98 102* 94  BUN 11 12 9 13   CREATININE 1.02 0.92 0.97 0.95  CALCIUM 8.9 8.9 9.1 9.0   Liver Function Tests:  Recent Labs Lab 10/23/16 0526 10/24/16 0239 10/24/16 1841  AST 19 18 19   ALT 10* 11* 12*  ALKPHOS 57 54 58  BILITOT 0.3 0.4 0.5  PROT 7.8 8.0 8.7*  ALBUMIN 3.9 4.1 4.4    Recent Labs Lab 10/23/16 0526 10/24/16 0239 10/24/16 1841  LIPASE 28 33 63*   No results for input(s): AMMONIA in the last 168 hours. Coagulation Profile:  Recent Labs Lab 10/24/16 2137 10/25/16 0518 10/25/16 1630 10/25/16 1833 10/26/16 0600  INR >10.00* 2.83 4.10* 5.15* 5.15*   CBC:  Recent Labs Lab 10/23/16 0526 10/24/16 0239 10/24/16 1841 10/24/16 2137 10/25/16 0518 10/26/16 0600  WBC 5.1 5.3 4.4  --  4.7 4.5  HGB 15.2 15.0 15.3  --  14.8 13.4  HCT 45.7 44.0 44.7  --  43.3 41.0  MCV 81.0 80.9 78.3  --  78.2 79.2  PLT 240 209 213 213 204 192   Cardiac Enzymes:  Recent Labs Lab 10/24/16 2027  CKTOTAL 179   BNP: Invalid input(s): POCBNP CBG:  Recent Labs Lab 10/25/16 2055 10/26/16 0008 10/26/16 0433 10/26/16 0751 10/26/16 1148  GLUCAP 105* 104* 111* 103* 200*   HbA1C:  Recent Labs  10/25/16 1339  HGBA1C 5.8*   Urine analysis:    Component Value Date/Time   COLORURINE RED (A) 10/25/2016 1831   APPEARANCEUR CLOUDY (A) 10/25/2016 1831   LABSPEC 1.021 10/25/2016 1831   PHURINE 5.0 10/25/2016 1831   GLUCOSEU NEGATIVE 10/25/2016 1831   HGBUR MODERATE (A) 10/25/2016 1831   BILIRUBINUR NEGATIVE 10/25/2016 1831   KETONESUR 5 (A) 10/25/2016 1831   PROTEINUR 100 (A) 10/25/2016 1831   UROBILINOGEN 1.0 03/20/2014 2351   NITRITE NEGATIVE 10/25/2016 1831    LEUKOCYTESUR NEGATIVE 10/25/2016 1831   Sepsis Labs: @LABRCNTIP (procalcitonin:4,lacticidven:4) )No results found for this or any previous visit (from the past 240 hour(s)).   Scheduled Meds: . amLODipine  5 mg Oral Daily  . buprenorphine-naloxone  2 tablet Sublingual BID  . chlorhexidine  15 mL Mouth Rinse BID  . feeding supplement (ENSURE ENLIVE)  237 mL Oral BID BM  . insulin aspart  0-9 Units Subcutaneous TID WC  . mouth rinse  15 mL Mouth Rinse q12n4p  . multivitamin with minerals  1 tablet Oral Daily  . pantoprazole (PROTONIX) IV  40 mg Intravenous Q12H  . phytonadione  10 mg Oral BID   Continuous Infusions:  Procedures/Studies: Ct Abdomen Pelvis W Contrast  Result Date: 10/23/2016 CLINICAL DATA:  Nausea and abdominal pain EXAM: CT  ABDOMEN AND PELVIS WITH CONTRAST TECHNIQUE: Multidetector CT imaging of the abdomen and pelvis was performed using the standard protocol following bolus administration of intravenous contrast. CONTRAST:  100 mL ISOVUE-300 IOPAMIDOL (ISOVUE-300) INJECTION 61% COMPARISON:  Ultrasound from earlier in the same day, 07/01/2016 FINDINGS: Lower chest: Minimal dependent atelectatic changes are noted. Hepatobiliary: The liver is diffusely decreased in attenuation consistent fatty infiltration. The gallbladder is well distended without focal abnormality. Pancreas: Unremarkable. No pancreatic ductal dilatation or surrounding inflammatory changes. Spleen: Spleen is within normal limits. Adrenals/Urinary Tract: Adrenal glands are unremarkable. Kidneys are normal, without renal calculi, focal lesion, or hydronephrosis. Bladder is decompressed. Stomach/Bowel: Scattered diverticular changes noted. No findings to suggest acute no diverticulitis is seen. Vascular/Lymphatic: Aortic atherosclerosis. No enlarged abdominal or pelvic lymph nodes. Reproductive: Prostate is unremarkable. Other: No abdominal wall hernia or abnormality. No abdominopelvic ascites. Musculoskeletal:  Postsurgical changes are noted in the lumbar spine and bilateral proximal femurs. No acute abnormality is seen. IMPRESSION: Chronic changes as described above stable from the previous exam. No acute abnormality noted. Electronically Signed   By: Alcide Clever M.D.   On: 10/23/2016 11:14   US Abdomen Limited Ruq  Result Date: 10/23/2016 CLINICAL DATA:  Abdominal pain with nausea and vomiting pain in pain History of systemic lupus erythematosus EXAM: US ABDOMEN LIMITED - RIGHT UPPER QUADRANT COMPARISON:  CT abdomen and pelvis July 01, 2016 FINDINGS: Gallbladder: No gallstones or wall thickening visualized. There is no pericholecystic fluid. No sonographic Murphy sign noted by sonographer. Common bile duct: Diameter: 3 mm. No intrahepatic or extrahepatic biliary duct dilatation. Liver: No focal lesion identified. Within normal limits in parenchymal echogenicity. Right kidney is noted to be somewhat echogenic. IMPRESSION: Somewhat echogenic right kidney. This is a finding that may be seen with medical renal disease. Appropriate laboratory correlation advised. Study otherwise unremarkable. Electronically Signed   By: Bretta Bang III M.D.   On: 10/23/2016 08:31    Anique Beckley, DO  [facility] Pager [phone]  If 7PM-7AM, please contact night-coverage [email or web address] Password [email or web address] 10/26/2016, 3:30 PM   LOS: 1 day

## 2016-10-26 NOTE — Progress Notes (Signed)
Came to see patient due to new finding of "fist size" induration on left flank  Pt denies any falls or injury.  c/o pain with palpation  Left Flank--~6x6 cm induration without erythema or open wound.  Tender to palpation.  No crepitance  CT abd/pelvis without contrast--?hematoma -plan additional FFP if hematoma -check CBC  -family at bedside updated.  DTat

## 2016-10-27 DIAGNOSIS — M7981 Nontraumatic hematoma of soft tissue: Secondary | ICD-10-CM

## 2016-10-27 DIAGNOSIS — R3121 Asymptomatic microscopic hematuria: Secondary | ICD-10-CM

## 2016-10-27 DIAGNOSIS — S301XXA Contusion of abdominal wall, initial encounter: Secondary | ICD-10-CM

## 2016-10-27 DIAGNOSIS — S301XXD Contusion of abdominal wall, subsequent encounter: Secondary | ICD-10-CM

## 2016-10-27 LAB — URINE CULTURE

## 2016-10-27 LAB — APTT
APTT: 66 s — AB (ref 24–36)
APTT: 49 s — AB (ref 24–36)

## 2016-10-27 LAB — OCCULT BLOOD X 1 CARD TO LAB, STOOL: Fecal Occult Bld: POSITIVE — AB

## 2016-10-27 LAB — PROTIME-INR
INR: 2.53
INR: 1.82
PROTHROMBIN TIME: 21.3 s — AB (ref 11.4–15.2)
Prothrombin Time: 27.7 seconds — ABNORMAL HIGH (ref 11.4–15.2)

## 2016-10-27 LAB — GLUCOSE, CAPILLARY
Glucose-Capillary: 96 mg/dL (ref 65–99)
Glucose-Capillary: 125 mg/dL — ABNORMAL HIGH (ref 65–99)
Glucose-Capillary: 103 mg/dL — ABNORMAL HIGH (ref 65–99)
Glucose-Capillary: 114 mg/dL — ABNORMAL HIGH (ref 65–99)

## 2016-10-27 LAB — MISC LABCORP TEST (SEND OUT)

## 2016-10-27 MED ORDER — PHYTONADIONE 5 MG PO TABS
10.0000 mg | ORAL_TABLET | Freq: Every day | ORAL | Status: DC
  Administered 2016-10-27 – 2016-10-28 (×7): 10 mg via ORAL
  Filled 2016-10-27 (×8): qty 2

## 2016-10-27 MED ORDER — PHYTONADIONE 5 MG PO TABS
10.0000 mg | ORAL_TABLET | Freq: Three times a day (TID) | ORAL | Status: DC
  Filled 2016-10-27: qty 2

## 2016-10-27 NOTE — Progress Notes (Addendum)
[facility]   HEMATOLOGY/ONCOLOGY INPATIENT PROGRESS NOTE  Date of Service: 10/27/2016  Inpatient Attending: .Catarina Hartshorn, MD   SUBJECTIVE  Patient notes he is doing okay. Had an induration develop over his left flank and had a CT that showed subcutaneous hematoma. No rectal bleeding or hematemesis. Urine still appears dark. PT/PTT  Improved this AM. Vitk dose adjusted per labs.   OBJECTIVE:  NAD  PHYSICAL EXAMINATION: . Vitals:   10/26/16 0438 10/26/16 1506 10/26/16 2127 10/27/16 0611  BP: (!) 146/95 134/85 131/87 135/82  Pulse: (!) 52 72 67 68  Resp: Temp: 98.5 F (36.9 C) 98.9 F (37.2 C) 98.9 F (37.2 C) 98.6 F (37 C)  TempSrc: Oral Oral Oral Oral  SpO2: 94% 98% 100% 99%  Weight:      Height:       Filed Weights   10/25/16 0235  Weight: 142 lb 4.8 oz (64.5 kg)   .Body mass index is 21.01 kg/m.  GENERAL:alert, in no acute distress and comfortable SKIN: skin color, texture, turgor are normal, no rashes or significant lesions EYES: normal, conjunctiva are pink and non-injected, sclera clear OROPHARYNX:no exudate, no erythema and lips, buccal mucosa, and tongue normal  NECK: supple, no JVD, thyroid normal size, non-tender, without nodularity LYMPH:  no palpable lymphadenopathy in the cervical, axillary or inguinal LUNGS: clear to auscultation with normal respiratory effort HEART: regular rate & rhythm,  no murmurs and no lower extremity edema ABDOMEN: abdomen soft, non-tender, normoactive bowel sounds  Musculoskeletal: no cyanosis of digits and no clubbing  PSYCH: alert & oriented x 3 with fluent speech NEURO: no focal motor/sensory deficits  MEDICAL HISTORY:  Past Medical History:  Diagnosis Date  . Complication of anesthesia   . COPD (chronic obstructive pulmonary disease) (HCC)    chronic Bronchhititis  . Diabetes mellitus   . Hypertension   . 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  . Pneumonia   . Seizures (HCC)    last one 3  years ago.  Md discontinued meds- Dr Anne Hahn    SURGICAL HISTORY: Past Surgical History:  Procedure Laterality Date  . ANTERIOR CERVICAL DECOMP/DISCECTOMY FUSION  12/20/2011   Procedure: ANTERIOR CERVICAL DECOMPRESSION/DISCECTOMY FUSION 2 LEVELS;  Surgeon: Mariam Dollar, MD;  Location: [facility];  Service: Neurosurgery;  Laterality: N/A;  Cervical four-five, five-six anterior cervical decompression/discectomy fusion with plating  . BACK SURGERY     5 from degenerative disease..    . HIP SURGERY     rods to each hip- hip can ot of socket.  . TONSILLECTOMY    . WRIST SURGERY     repair due to crush    SOCIAL HISTORY: Social History   Social History  . Marital status: Single    Spouse name: N/A  . Number of children: N/A  . Years of education: N/A   Occupational History  . Not on file.   Social History Main Topics  . Smoking status: Former Smoker    Packs/day: 0.50    Quit date: 09/22/2016  . Smokeless tobacco: Never Used  . Alcohol use No     Comment: occassional   . Drug use: No  . Sexual activity: Not on file   Other Topics Concern  . Not on file   Social History Narrative  . No narrative on file    FAMILY HISTORY: Family History  Problem Relation Age of Onset  . Diabetes Mellitus II Mother  ALLERGIES:  is allergic to darvocet [propoxyphene n-acetaminophen] and onion.  MEDICATIONS:  Scheduled Meds: . amLODipine  5 mg Oral Daily  . buprenorphine-naloxone  2 tablet Sublingual BID  . chlorhexidine  15 mL Mouth Rinse BID  . feeding supplement (ENSURE ENLIVE)  237 mL Oral BID BM  . insulin aspart  0-9 Units Subcutaneous TID WC  . mouth rinse  15 mL Mouth Rinse q12n4p  . multivitamin with minerals  1 tablet Oral Daily  . pantoprazole (PROTONIX) IV  40 mg Intravenous Q12H  . phytonadione  10 mg Subcutaneous TID AC & HS  . phytonadione  10 mg Oral BID   Continuous Infusions: PRN Meds:.hydrALAZINE, morphine injection, ondansetron **OR** ondansetron (ZOFRAN)  IV  REVIEW OF SYSTEMS:    10 Point review of Systems was done is negative except as noted above.   LABORATORY DATA:  I have reviewed the data as listed  . CBC Latest Ref Rng & Units 10/26/2016 10/26/2016 10/25/2016  WBC 4.0 - 10.5 K/uL 4.6 4.5 4.7  Hemoglobin 13.0 - 17.0 g/dL 16.1 09.6 04.5  Hematocrit 39.0 - 52.0 % 43.1 41.0 43.3  Platelets 150 - 400 K/uL 218 192 204    . CMP Latest Ref Rng & Units 10/25/2016 10/24/2016 10/24/2016  Glucose 65 - 99 mg/dL 94 409(W) 98  BUN 6 - 20 mg/dL Creatinine 0.61 - 1.24 mg/dL 1.19 1.47 8.29  Sodium 135 - 145 mmol/L 136 136 136  Potassium 3.5 - 5.1 mmol/L 3.6 3.7 3.6  Chloride 101 - 111 mmol/L 102 101 104  CO2 22 - 32 mmol/L Calcium 8.9 - 10.3 mg/dL 9.0 9.1 8.9  Total Protein 6.5 - 8.1 g/dL - 8.7(H) 8.0  Total Bilirubin 0.3 - 1.2 mg/dL - 0.5 0.4  Alkaline Phos 38 - 126 U/L - 58 54  AST 15 - 41 U/L - 19 18  ALT 17 - 63 U/L - 12(L) 11(L)   Component     Latest Ref Rng & Units 10/26/2016 10/26/2016 10/27/2016         6:00 AM  5:33 PM   Prothrombin Time     11.4 - 15.2 seconds 49.0 (H) 50.7 (H) 27.7 (H)  INR      5.15 (HH) 5.37 (HH) 2.53  APTT     24 - 36 seconds 77 (H) 81 (H) 66 (H)    RADIOGRAPHIC STUDIES: I have personally reviewed the radiological images as listed and agreed with the findings in the report. Ct Abdomen Pelvis Wo Contrast  Result Date: 10/27/2016 CLINICAL DATA:  Hypertension, persistent pain do the abdomen. Nausea vomiting and rectal bleeding EXAM: CT ABDOMEN AND PELVIS WITHOUT CONTRAST TECHNIQUE: Multidetector CT imaging of the abdomen and pelvis was performed following the standard protocol without IV contrast. COMPARISON:  CT 10/23/2016 FINDINGS: Lower chest: Lung bases are clear. Hepatobiliary: No focal hepatic lesions noncontrast exam. Normal gallbladder Pancreas: Pancreas is normal. No ductal dilatation. No pancreatic inflammation. Spleen: Normal spleen Adrenals/urinary tract: Bilateral adrenal  adenomas no nephrolithiasis or ureterolithiasis. No bladder calculi Stomach/Bowel: Stomach, small-bowel and cecum are normal. The appendix is not identified but there is no pericecal inflammation to suggest appendicitis. The colon and rectosigmoid colon are normal. Vascular/Lymphatic: Abdominal aorta is normal caliber. There is no retroperitoneal or periportal lymphadenopathy. No pelvic lymphadenopathy. Reproductive: Uterus normal. Other: No free fluid. Musculoskeletal: No aggressive osseous lesion. Posterior lumbar fusion and decompression. Significant streak artifact generated from the metallic hardware. 2.8 x 1.6 cm hematoma  superficial to the LEFT iliac wing (image 51, series 2). IMPRESSION: 1. Subcutaneous hematoma LEFT flank adjacent to the iliac wing *No bowel abnormality identified on this noncontrast exam.  I *Atherosclerotic calcification of the aorta. Electronically Signed   By: Genevive Bi M.D.   On: 10/27/2016 09:13   Ct Abdomen Pelvis W Contrast  Result Date: 10/23/2016 CLINICAL DATA:  Nausea and abdominal pain EXAM: CT ABDOMEN AND PELVIS WITH CONTRAST TECHNIQUE: Multidetector CT imaging of the abdomen and pelvis was performed using the standard protocol following bolus administration of intravenous contrast. CONTRAST:  100 mL ISOVUE-300 IOPAMIDOL (ISOVUE-300) INJECTION 61% COMPARISON:  Ultrasound from earlier in the same day, 07/01/2016 FINDINGS: Lower chest: Minimal dependent atelectatic changes are noted. Hepatobiliary: The liver is diffusely decreased in attenuation consistent fatty infiltration. The gallbladder is well distended without focal abnormality. Pancreas: Unremarkable. No pancreatic ductal dilatation or surrounding inflammatory changes. Spleen: Spleen is within normal limits. Adrenals/Urinary Tract: Adrenal glands are unremarkable. Kidneys are normal, without renal calculi, focal lesion, or hydronephrosis. Bladder is decompressed. Stomach/Bowel: Scattered diverticular changes  noted. No findings to suggest acute no diverticulitis is seen. Vascular/Lymphatic: Aortic atherosclerosis. No enlarged abdominal or pelvic lymph nodes. Reproductive: Prostate is unremarkable. Other: No abdominal wall hernia or abnormality. No abdominopelvic ascites. Musculoskeletal: Postsurgical changes are noted in the lumbar spine and bilateral proximal femurs. No acute abnormality is seen. IMPRESSION: Chronic changes as described above stable from the previous exam. No acute abnormality noted. Electronically Signed   By: Alcide Clever M.D.   On: 10/23/2016 11:14   US Abdomen Limited Ruq  Result Date: 10/23/2016 CLINICAL DATA:  Abdominal pain with nausea and vomiting pain in pain History of systemic lupus erythematosus EXAM: US ABDOMEN LIMITED - RIGHT UPPER QUADRANT COMPARISON:  CT abdomen and pelvis July 01, 2016 FINDINGS: Gallbladder: No gallstones or wall thickening visualized. There is no pericholecystic fluid. No sonographic Murphy sign noted by sonographer. Common bile duct: Diameter: 3 mm. No intrahepatic or extrahepatic biliary duct dilatation. Liver: No focal lesion identified. Within normal limits in parenchymal echogenicity. Right kidney is noted to be somewhat echogenic. IMPRESSION: Somewhat echogenic right kidney. This is a finding that may be seen with medical renal disease. Appropriate laboratory correlation advised. Study otherwise unremarkable. Electronically Signed   By: Bretta Bang III M.D.   On: 10/23/2016 08:31    ASSESSMENT & PLAN:   51 year old African-American male with    #1 Severe coagulopathy with upper and lower GI bleeding and hematuria.  Patient had significantly elevated PT>90, PTT 419 with nl fibrinogen level of 419 and neg  D dimer. Patient received FFP and vitamin K 10 mg IV with improvement of PT to 30.  His history of likely exposure to D con rat poison (Brodifacoum) along with elevation of PT and PTT and evidence of his most vitamin K is consistent  with super warfarin toxicity unless proven otherwise.  Patient reports no exposure to Coumadin and no evidence of using somebody else's medications or other anticoagulants.  PT and PTT better today on  daily of vit K yesterday (  [place] and  PO) PT and PTT mixing studies do not suggest obvious factor inhibitors being present. #2 Rectal bleeing- resolved at this time #3 Hematuria - still with some pinkish urine but no overt blood clots #4 Likely Brodifacoum exposure. #5 Left flank induration - ?hematoma  Plan -increased PO Vit K to /d (  5 times daily)  -continue [place] VitK total /d (  [place] 4 times daily) -target INR to  goal of <3 if no bleeding tighter goals if actively bleeding. - if no bleeding will start transitioning the [place] to PO vit K to determine PO discharge dose. (literature suggests that can varies from 50-200mg  of Vit K daily) --Once we know the total daily requirements will switch that all to oral vitamin K and will have to ensure stability of coags and no clinical bleeding for 24-48 hours prior to considering discharge. -No intramuscular injections --Would recommend getting PT and PTT twice daily to help adjust vitamin K dose. -If the patient has acute severe  bleeding would recommend using prothrombin complex concentrate (Kcentra) 25-50 international units per kilogram to a maximum dose of 2000 international units. - could also use FFP's to control minor bleeds but will not be as effective as K centra -If the patient has significant severe life-threatening bleeding including brain bleeds could also consider recombinant factor VIIa -Fall precautions and avoiding activities that could cause trauma due to high risk of bleeding. -discussed with lab-  rodenticide anticoagulant testing results will likely to available tomorrow  -It will be important for the social worker to confirm the cost of oral vitamin K based on the patient's insurance since this can be quite  costly and difficult to cover and is an important element in his discharge planning. -GI prophylaxis with PPIs twice a day . -Avoid NSAIDs . -Do not discharge the patient from the hospital until his PT and PTT can be well controlled on a defined dose of vitamin K . If this is confirmed to be Brodifacoum toxicity. vitamin K replacement could be needed for several weeks as outpatient after discharge at fairly high doses . - we will continue to follow in patient. He will need to be seen in hematology clinic in 5-7 days after discharge.  PLZ call on-call oncologist over the weekend if any questions or guidance needed. I shall return to see patient on Monday.  I spent 30 minutes counseling the patient face to face. The total time spent in the appointment was 40 minutes and more than 50% was on counseling and direct patient cares.    Wyvonnia Lora MD MS AAHIVMS [facility] Hematology/Oncology Physician [facility]  (Office):       [phone] (Work cell):  [phone] (Fax):           [phone]  10/27/2016 8:36 AM

## 2016-10-27 NOTE — Progress Notes (Signed)
PROGRESS NOTE  [name] ZOX:[id number] DOB: [birth year] DOA: 10/24/2016 PCP: [facility]  Brief History:  50 y.o.malewith history of diabetes mellitus type 2, hypertension, COPD and chronic pain presents with the ER with complaints of persistent pain in the abdomen with nausea vomiting and rectal bleedingof 2-3 day duration. Patient also complains of bleeding gums and hematuria for the past 2-3 days. Patient also complained of abdominal pain. He visited the emergency department on 10/23/2016. CT of the abdomen and pelvis at that time was negative for any acute abnormalities. Fatty liver was noted. Since admission, the patient states that his emesis has improved and feels hungry. He states that his abdominal pain has also improved since admission. His last bowel movement was on 10/23/2016. He was noted to have a coagulopathy with INR greater than 10 and PTT 147. Urinalysis also showed TNTC RBC. Notably, the patient states that he has been handling "D-Con" to try to get rid of rats in his backyard. 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 Candise Che -1 unit FFP and vitamin K 10mg  IV given initially -INR starting to trend down -still has some bleeding gums and hematuria but improving -FOBT--negative -increasing vitamin K to 90 mg daily (40 mg [place], 50 mg po) per hematology recommendation -recheck INR, PTT daily -(Kcentra)25-50 international units per kilogram for severe bleeding -Hgb remains stable -am CBC  Hematuria -10/23/16 CT abd/pelvis with contrast--no acute findings -appreciate urology consult -?coagulopathy unmasking underlying bladder issue? -UA with TNTC RBC on 4/23  Left Flank hematoma -10/26/16 confirmed by CT abd/pelvis -Fall precautions and  avoiding activities that could cause trauma due to high risk of bleeding.  ?Rectal bleeding -FOBT negative on 4/24 -repeat FOBT  Pyuria -pt without dysuria -repeat UA--pyuria resolved  Diabetes mellitus  -check A1C--5.8 -novolog sliding scale -holding metformin and amaryl  HTN -not on any medications outpt -started amlodipine  COPD -stable on RA  Chronic pain syndrome -continue home dose suboxone -I have reviewed NCSRS data base--pt getting only from one source -pain controlled  Moderate malnutrition -continue supplements     Disposition Plan:   Home in 2-3 days  Family Communication:   No Family at bedside  Consultants:  Hematology--Kale; Urology --McDiarmid  Code Status:  FULL  DVT Prophylaxis:  SCDs    Procedures: As Listed in Progress Note Above  Antibiotics: None    Subjective: Patient has developed a small hematoma on his left flank. He denies any recent injury or trauma. Denies any chest pain, shortness breath, nausea, vomiting, diarrhea. He has some epigastric discomfort. He is tolerating his diet. He states that his hematuria is improving. There's been no evidence of rectal bleeding since admission.  Objective: Vitals:   10/26/16 1506 10/26/16 2127 10/27/16 0611 10/27/16 1348  BP: 134/85 131/87 135/82 (!) 124/92  Pulse: 72 67 68 74  Resp: 19 18 20 18   Temp: 98.9 F (37.2 C) 98.9 F (37.2 C) 98.6 F (37 C) 98 F (36.7 C)  TempSrc: Oral Oral Oral Oral  SpO2: 98% 100% 99% 100%  Weight:      Height:        Intake/Output Summary (Last 24 hours) at 10/27/16 1701 Last data filed at 10/27/16 1030  Gross per 24 hour  Intake  240 ml  Output             1150 ml  Net             -910 ml   Weight change:  Exam:   General:  Pt is alert, follows commands appropriately, not in acute distress  HEENT: No icterus, No thrush, No neck mass, [place]/AT  Cardiovascular: RRR, S1/S2, no rubs, no gallops  Respiratory: CTA  bilaterally, no wheezing, no crackles, no rhonchi  Abdomen: Soft/+BS, non tender, non distended, no guarding  Extremities: No edema, No lymphangitis, No petechiae, No rashes, no synovitis; approximately 3 cm hematoma left flank above the iliac crest. Approximately 3 cm hematoma  lateral aspect of right thigh.   Data Reviewed: I have personally reviewed following labs and imaging studies Basic Metabolic Panel:  Recent Labs Lab 10/23/16 0526 10/24/16 0239 10/24/16 1841 10/25/16 0518  NA 136 136 136 136  K 3.8 3.6 3.7 3.6  CL 104 104 101 102  CO2 24 24 24 24   GLUCOSE 95 98 102* 94  BUN 11 12 9 13   CREATININE 1.02 0.92 0.97 0.95  CALCIUM 8.9 8.9 9.1 9.0   Liver Function Tests:  Recent Labs Lab 10/23/16 0526 10/24/16 0239 10/24/16 1841  AST 19 18 19   ALT 10* 11* 12*  ALKPHOS 57 54 58  BILITOT 0.3 0.4 0.5  PROT 7.8 8.0 8.7*  ALBUMIN 3.9 4.1 4.4    Recent Labs Lab 10/23/16 0526 10/24/16 0239 10/24/16 1841  LIPASE 28 33 63*   No results for input(s): AMMONIA in the last 168 hours. Coagulation Profile:  Recent Labs Lab 10/25/16 1630 10/25/16 1833 10/26/16 0600 10/26/16 1733 10/27/16 0532  INR 4.10* 5.15* 5.15* 5.37* 2.53   CBC:  Recent Labs Lab 10/24/16 0239 10/24/16 1841 10/24/16 2137 10/25/16 0518 10/26/16 0600 10/26/16 1733  WBC 5.3 4.4  --  4.7 4.5 4.6  HGB 15.0 15.3  --  14.8 13.4 14.1  HCT 44.0 44.7  --  43.3 41.0 43.1  MCV 80.9 78.3  --  78.2 79.2 82.3  PLT 209 213 213 204 192 218   Cardiac Enzymes:  Recent Labs Lab 10/24/16 2027  CKTOTAL 179   BNP: Invalid input(s): POCBNP CBG:  Recent Labs Lab 10/26/16 1646 10/26/16 2125 10/27/16 0724 10/27/16 1128 10/27/16 1642  GLUCAP 101* 242* 96 125* 103*   HbA1C:  Recent Labs  10/25/16 1339  HGBA1C 5.8*   Urine analysis:    Component Value Date/Time   COLORURINE RED (A) 10/25/2016 1831   APPEARANCEUR CLOUDY (A) 10/25/2016 1831   LABSPEC 1.021 10/25/2016 1831   PHURINE  5.0 10/25/2016 1831   GLUCOSEU NEGATIVE 10/25/2016 1831   HGBUR MODERATE (A) 10/25/2016 1831   BILIRUBINUR NEGATIVE 10/25/2016 1831   KETONESUR 5 (A) 10/25/2016 1831   PROTEINUR 100 (A) 10/25/2016 1831   UROBILINOGEN 1.0 03/20/2014 2351   NITRITE NEGATIVE 10/25/2016 1831   LEUKOCYTESUR NEGATIVE 10/25/2016 1831   Sepsis Labs: @LABRCNTIP (procalcitonin:4,lacticidven:4) ) Recent Results (from the past 240 hour(s))  Culture, Urine     Status: Abnormal   Collection Time: 10/24/16  6:31 PM  Result Value Ref Range Status   Specimen Description URINE, RANDOM  Final   Special Requests NONE  Final   Culture MULTIPLE SPECIES PRESENT, SUGGEST RECOLLECTION (A)  Final   Report Status 10/27/2016 FINAL  Final     Scheduled Meds: . amLODipine  5 mg Oral Daily  . buprenorphine-naloxone  2 tablet Sublingual BID  . chlorhexidine  15 mL Mouth Rinse BID  . feeding supplement (ENSURE ENLIVE)  237 mL Oral BID BM  . insulin aspart  0-9 Units Subcutaneous TID WC  . mouth rinse  15 mL Mouth Rinse q12n4p  . multivitamin with minerals  1 tablet Oral Daily  . pantoprazole (PROTONIX) IV  40 mg Intravenous Q12H  . phytonadione  10 mg Subcutaneous TID AC & HS  . phytonadione  10 mg Oral 5 X Daily   Continuous Infusions:  Procedures/Studies: Ct Abdomen Pelvis Wo Contrast  Result Date: 10/27/2016 CLINICAL DATA:  Hypertension, persistent pain do the abdomen. Nausea vomiting and rectal bleeding EXAM: CT ABDOMEN AND PELVIS WITHOUT CONTRAST TECHNIQUE: Multidetector CT imaging of the abdomen and pelvis was performed following the standard protocol without IV contrast. COMPARISON:  CT 10/23/2016 FINDINGS: Lower chest: Lung bases are clear. Hepatobiliary: No focal hepatic lesions noncontrast exam. Normal gallbladder Pancreas: Pancreas is normal. No ductal dilatation. No pancreatic inflammation. Spleen: Normal spleen Adrenals/urinary tract: Bilateral adrenal adenomas no nephrolithiasis or ureterolithiasis. No bladder  calculi Stomach/Bowel: Stomach, small-bowel and cecum are normal. The appendix is not identified but there is no pericecal inflammation to suggest appendicitis. The colon and rectosigmoid colon are normal. Vascular/Lymphatic: Abdominal aorta is normal caliber. There is no retroperitoneal or periportal lymphadenopathy. No pelvic lymphadenopathy. Reproductive: Uterus normal. Other: No free fluid. Musculoskeletal: No aggressive osseous lesion. Posterior lumbar fusion and decompression. Significant streak artifact generated from the metallic hardware. 2.8 x 1.6 cm hematoma superficial to the LEFT iliac wing (image 51, series 2). IMPRESSION: 1. Subcutaneous hematoma LEFT flank adjacent to the iliac wing *No bowel abnormality identified on this noncontrast exam.  I *Atherosclerotic calcification of the aorta. Electronically Signed   By: Genevive Bi M.D.   On: 10/27/2016 09:13   Ct Abdomen Pelvis W Contrast  Result Date: 10/23/2016 CLINICAL DATA:  Nausea and abdominal pain EXAM: CT ABDOMEN AND PELVIS WITH CONTRAST TECHNIQUE: Multidetector CT imaging of the abdomen and pelvis was performed using the standard protocol following bolus administration of intravenous contrast. CONTRAST:  100 mL ISOVUE-300 IOPAMIDOL (ISOVUE-300) INJECTION 61% COMPARISON:  Ultrasound from earlier in the same day, 07/01/2016 FINDINGS: Lower chest: Minimal dependent atelectatic changes are noted. Hepatobiliary: The liver is diffusely decreased in attenuation consistent fatty infiltration. The gallbladder is well distended without focal abnormality. Pancreas: Unremarkable. No pancreatic ductal dilatation or surrounding inflammatory changes. Spleen: Spleen is within normal limits. Adrenals/Urinary Tract: Adrenal glands are unremarkable. Kidneys are normal, without renal calculi, focal lesion, or hydronephrosis. Bladder is decompressed. Stomach/Bowel: Scattered diverticular changes noted. No findings to suggest acute no diverticulitis is seen.  Vascular/Lymphatic: Aortic atherosclerosis. No enlarged abdominal or pelvic lymph nodes. Reproductive: Prostate is unremarkable. Other: No abdominal wall hernia or abnormality. No abdominopelvic ascites. Musculoskeletal: Postsurgical changes are noted in the lumbar spine and bilateral proximal femurs. No acute abnormality is seen. IMPRESSION: Chronic changes as described above stable from the previous exam. No acute abnormality noted. Electronically Signed   By: Alcide Clever M.D.   On: 10/23/2016 11:14   US Abdomen Limited Ruq  Result Date: 10/23/2016 CLINICAL DATA:  Abdominal pain with nausea and vomiting pain in pain History of systemic lupus erythematosus EXAM: US ABDOMEN LIMITED - RIGHT UPPER QUADRANT COMPARISON:  CT abdomen and pelvis July 01, 2016 FINDINGS: Gallbladder: No gallstones or wall thickening visualized. There is no pericholecystic fluid. No sonographic Murphy sign noted by sonographer. Common bile duct: Diameter: 3 mm. No intrahepatic or extrahepatic biliary duct dilatation. Liver: No focal lesion identified. Within normal  limits in parenchymal echogenicity. Right kidney is noted to be somewhat echogenic. IMPRESSION: Somewhat echogenic right kidney. This is a finding that may be seen with medical renal disease. Appropriate laboratory correlation advised. Study otherwise unremarkable. Electronically Signed   By: Bretta Bang III M.D.   On: 10/23/2016 08:31    Marciana Uplinger, DO  [facility] Pager [phone]  If 7PM-7AM, please contact night-coverage [email or web address] Password [email or web address] 10/27/2016, 5:01 PM   LOS: 2 days

## 2016-10-28 LAB — CBC
HCT: 41 % (ref 39.0–52.0)
Hemoglobin: 13.4 g/dL (ref 13.0–17.0)
MCH: 26.6 pg (ref 26.0–34.0)
MCHC: 32.7 g/dL (ref 30.0–36.0)
MCV: 81.5 fL (ref 78.0–100.0)
Platelets: 218 K/uL (ref 150–400)
RBC: 5.03 MIL/uL (ref 4.22–5.81)
RDW: 14.1 % (ref 11.5–15.5)
WBC: 4.6 K/uL (ref 4.0–10.5)

## 2016-10-28 LAB — PROTIME-INR
INR: 1.38
Prothrombin Time: 17.1 seconds — ABNORMAL HIGH (ref 11.4–15.2)

## 2016-10-28 LAB — GLUCOSE, CAPILLARY
GLUCOSE-CAPILLARY: 85 mg/dL (ref 65–99)
GLUCOSE-CAPILLARY: 123 mg/dL — AB (ref 65–99)
Glucose-Capillary: 166 mg/dL — ABNORMAL HIGH (ref 65–99)
Glucose-Capillary: 92 mg/dL (ref 65–99)

## 2016-10-28 LAB — APTT: aPTT: 40 s — ABNORMAL HIGH (ref 24–36)

## 2016-10-28 MED ORDER — PHYTONADIONE 5 MG PO TABS
20.0000 mg | ORAL_TABLET | Freq: Three times a day (TID) | ORAL | Status: DC
  Administered 2016-10-28 – 2016-10-30 (×5): 20 mg via ORAL
  Filled 2016-10-28 (×7): qty 4

## 2016-10-28 MED ORDER — VITAMIN K1 10 MG/ML IJ SOLN
10.0000 mg | Freq: Two times a day (BID) | INTRAMUSCULAR | Status: DC
Start: 2016-10-28 — End: 2016-10-29
  Administered 2016-10-28 – 2016-10-29 (×2): 10 mg via SUBCUTANEOUS
  Filled 2016-10-28 (×2): qty 1

## 2016-10-28 NOTE — Progress Notes (Signed)
PROGRESS NOTE  [name] XBJ:[id number] DOB: 05-17-1966 DOA: 10/24/2016 PCP: [facility]   Brief History: 50 y.o.malewith history of diabetes mellitus type 2, hypertension, COPD and chronic pain presents with the ER with complaints of persistent pain in the abdomen with nausea vomiting and rectal bleedingof 2-3 day duration. Patient also complains of bleeding gums and hematuriafor the past 2-3 days. Patient also complained of abdominal pain. He visited the emergency department on 10/23/2016. CT of the abdomen and pelvis at that time was negative for any acute abnormalities. Fatty liver was noted. Since admission, the patient states that his emesis has improved and feels hungry. He states that his abdominal pain has also improved since admission. His last bowel movement was on 10/23/2016. He was noted to have a coagulopathy with INR greater than 10 and PTT 147. Urinalysis also showed TNTC RBC.Notably, the patient states that he has been handling "D-Con" to try to get rid of rats in his backyard. 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 Candise Che -1 unit FFP and vitamin K 10mg  IV given initially -INR starting to trend down -still has some bleeding gums and hematuria but improving -FOBT--negative -increasing vitamin K to 90 mg daily (40 mg [place], 50 mg po) per hematology recommendation-->transition to po vitaming K -recheck INR, PTT daily-->improving -(Kcentra)25-50 international units per kilogram for severe bleeding -dropped initially, but stable since then -am CBC  Hematuria -10/23/16 CT abd/pelvis with contrast--no acute findings -appreciate urology consult -?coagulopathy unmasking underlying bladder issue? -UA with TNTC RBC on 4/23  Left Flank  hematoma/R-leg hematoma -10/26/16 confirmed by CT abd/pelvis -Fall precautions and avoiding activities that could cause trauma due to high risk of bleeding. -stable  ?Rectal bleeding/hematochezia -FOBT negative on 4/24 -repeat FOBT--positive -consult [facility]  Pyuria -pt without dysuria -repeat UA--pyuria resolved  Diabetes mellitus  -check A1C--5.8 -novolog sliding scale -holding metformin and amaryl -CBGs well controlled  HTN -not on any medications outpt -started amlodipine  COPD -stable on RA  Chronic pain syndrome -continue home dose suboxone -I have reviewed NCSRS data base--pt getting only from one source -pain controlled  Moderate malnutrition -continue supplements     Disposition Plan: 4/30 or 5/1 Family Communication: No Family at bedside  Consultants: Hematology--Kale; Urology --McDiarmid; [facility]  Code Status: FULL  DVT Prophylaxis: SCDs    Procedures: As Listed in Progress Note Above  Antibiotics: None         Subjective: Overall feeling better. He feels hungry and wants to see more. Denies any nausea, vomiting, diarrhea. No further hematochezia. Hematuria is better. Denies any headache or neck pain.  Objective: Vitals:   10/27/16 2143 10/28/16 0515 10/28/16 0829 10/28/16 1405  BP: (!) 159/97 134/82 (!) 148/98 (!) 136/100  Pulse: 65 (!) 56 71 81  Resp: 18 20 18 18   Temp: 98.2 F (36.8 C) 98.3 F (36.8 C) 98.3 F (36.8 C) 98.4 F (36.9 C)  TempSrc: Oral Oral Oral Oral  SpO2: 100% 100% 100% 100%  Weight:      Height:        Intake/Output Summary (Last 24 hours) at 10/28/16 1538 Last data filed at 10/28/16 1406  Gross per 24 hour  Intake              600 ml  Output  1475 ml  Net             -875 ml   Weight change:  Exam:   General:  Pt is alert, follows commands appropriately, not in acute distress  HEENT: No icterus, No thrush, No neck mass, [place]/AT  Cardiovascular: RRR,  S1/S2, no rubs, no gallops  Respiratory: CTA bilaterally, no wheezing, no crackles, no rhonchi  Abdomen: Soft/+BS, non tender, non distended, no guarding  Extremities: No edema, No lymphangitis, No petechiae, No rashes, no synovitis  Left flank above the iliac crest hematoma--3cm without crepitance, no erythema; right thigh hematoma-3cm   Data Reviewed: I have personally reviewed following labs and imaging studies Basic Metabolic Panel:  Recent Labs Lab 10/23/16 0526 10/24/16 0239 10/24/16 1841 10/25/16 0518  NA 136 136 136 136  K 3.8 3.6 3.7 3.6  CL 104 104 101 102  CO2 24 24 24 24   GLUCOSE 95 98 102* 94  BUN 11 12 9 13   CREATININE 1.02 0.92 0.97 0.95  CALCIUM 8.9 8.9 9.1 9.0   Liver Function Tests:  Recent Labs Lab 10/23/16 0526 10/24/16 0239 10/24/16 1841  AST 19 18 19   ALT 10* 11* 12*  ALKPHOS 57 54 58  BILITOT 0.3 0.4 0.5  PROT 7.8 8.0 8.7*  ALBUMIN 3.9 4.1 4.4    Recent Labs Lab 10/23/16 0526 10/24/16 0239 10/24/16 1841  LIPASE 28 33 63*   No results for input(s): AMMONIA in the last 168 hours. Coagulation Profile:  Recent Labs Lab 10/26/16 0600 10/26/16 1733 10/27/16 0532 10/27/16 1648 10/28/16 0532  INR 5.15* 5.37* 2.53 1.82 1.38   CBC:  Recent Labs Lab 10/24/16 1841 10/24/16 2137 10/25/16 0518 10/26/16 0600 10/26/16 1733 10/28/16 0532  WBC 4.4  --  4.7 4.5 4.6 4.6  HGB 15.3  --  14.8 13.4 14.1 13.4  HCT 44.7  --  43.3 41.0 43.1 41.0  MCV 78.3  --  78.2 79.2 82.3 81.5  PLT 213 213 204 192 218 218   Cardiac Enzymes:  Recent Labs Lab 10/24/16 2027  CKTOTAL 179   BNP: Invalid input(s): POCBNP CBG:  Recent Labs Lab 10/27/16 1128 10/27/16 1642 10/27/16 2142 10/28/16 0728 10/28/16 1116  GLUCAP 125* 103* 114* 85 123*   HbA1C: No results for input(s): HGBA1C in the last 72 hours. Urine analysis:    Component Value Date/Time   COLORURINE RED (A) 10/25/2016 1831   APPEARANCEUR CLOUDY (A) 10/25/2016 1831   LABSPEC  1.021 10/25/2016 1831   PHURINE 5.0 10/25/2016 1831   GLUCOSEU NEGATIVE 10/25/2016 1831   HGBUR MODERATE (A) 10/25/2016 1831   BILIRUBINUR NEGATIVE 10/25/2016 1831   KETONESUR 5 (A) 10/25/2016 1831   PROTEINUR 100 (A) 10/25/2016 1831   UROBILINOGEN 1.0 03/20/2014 2351   NITRITE NEGATIVE 10/25/2016 1831   LEUKOCYTESUR NEGATIVE 10/25/2016 1831   Sepsis Labs: @LABRCNTIP (procalcitonin:4,lacticidven:4) ) Recent Results (from the past 240 hour(s))  Culture, Urine     Status: Abnormal   Collection Time: 10/24/16  6:31 PM  Result Value Ref Range Status   Specimen Description URINE, RANDOM  Final   Special Requests NONE  Final   Culture MULTIPLE SPECIES PRESENT, SUGGEST RECOLLECTION (A)  Final   Report Status 10/27/2016 FINAL  Final     Scheduled Meds: . amLODipine  5 mg Oral Daily  . buprenorphine-naloxone  2 tablet Sublingual BID  . chlorhexidine  15 mL Mouth Rinse BID  . feeding supplement (ENSURE ENLIVE)  237 mL Oral BID BM  . insulin aspart  0-9 Units Subcutaneous TID WC  . mouth rinse  15 mL Mouth Rinse q12n4p  . multivitamin with minerals  1 tablet Oral Daily  . pantoprazole (PROTONIX) IV  40 mg Intravenous Q12H  . phytonadione  10 mg Subcutaneous BID  . phytonadione  10 mg Oral 5 X Daily   Continuous Infusions:  Procedures/Studies: Ct Abdomen Pelvis Wo Contrast  Result Date: 10/27/2016 CLINICAL DATA:  Hypertension, persistent pain do the abdomen. Nausea vomiting and rectal bleeding EXAM: CT ABDOMEN AND PELVIS WITHOUT CONTRAST TECHNIQUE: Multidetector CT imaging of the abdomen and pelvis was performed following the standard protocol without IV contrast. COMPARISON:  CT 10/23/2016 FINDINGS: Lower chest: Lung bases are clear. Hepatobiliary: No focal hepatic lesions noncontrast exam. Normal gallbladder Pancreas: Pancreas is normal. No ductal dilatation. No pancreatic inflammation. Spleen: Normal spleen Adrenals/urinary tract: Bilateral adrenal adenomas no nephrolithiasis or  ureterolithiasis. No bladder calculi Stomach/Bowel: Stomach, small-bowel and cecum are normal. The appendix is not identified but there is no pericecal inflammation to suggest appendicitis. The colon and rectosigmoid colon are normal. Vascular/Lymphatic: Abdominal aorta is normal caliber. There is no retroperitoneal or periportal lymphadenopathy. No pelvic lymphadenopathy. Reproductive: Uterus normal. Other: No free fluid. Musculoskeletal: No aggressive osseous lesion. Posterior lumbar fusion and decompression. Significant streak artifact generated from the metallic hardware. 2.8 x 1.6 cm hematoma superficial to the LEFT iliac wing (image 51, series 2). IMPRESSION: 1. Subcutaneous hematoma LEFT flank adjacent to the iliac wing *No bowel abnormality identified on this noncontrast exam.  I *Atherosclerotic calcification of the aorta. Electronically Signed   By: Genevive Bi M.D.   On: 10/27/2016 09:13   Ct Abdomen Pelvis W Contrast  Result Date: 10/23/2016 CLINICAL DATA:  Nausea and abdominal pain EXAM: CT ABDOMEN AND PELVIS WITH CONTRAST TECHNIQUE: Multidetector CT imaging of the abdomen and pelvis was performed using the standard protocol following bolus administration of intravenous contrast. CONTRAST:  100 mL ISOVUE-300 IOPAMIDOL (ISOVUE-300) INJECTION 61% COMPARISON:  Ultrasound from earlier in the same day, 07/01/2016 FINDINGS: Lower chest: Minimal dependent atelectatic changes are noted. Hepatobiliary: The liver is diffusely decreased in attenuation consistent fatty infiltration. The gallbladder is well distended without focal abnormality. Pancreas: Unremarkable. No pancreatic ductal dilatation or surrounding inflammatory changes. Spleen: Spleen is within normal limits. Adrenals/Urinary Tract: Adrenal glands are unremarkable. Kidneys are normal, without renal calculi, focal lesion, or hydronephrosis. Bladder is decompressed. Stomach/Bowel: Scattered diverticular changes noted. No findings to suggest  acute no diverticulitis is seen. Vascular/Lymphatic: Aortic atherosclerosis. No enlarged abdominal or pelvic lymph nodes. Reproductive: Prostate is unremarkable. Other: No abdominal wall hernia or abnormality. No abdominopelvic ascites. Musculoskeletal: Postsurgical changes are noted in the lumbar spine and bilateral proximal femurs. No acute abnormality is seen. IMPRESSION: Chronic changes as described above stable from the previous exam. No acute abnormality noted. Electronically Signed   By: Alcide Clever M.D.   On: 10/23/2016 11:14   US Abdomen Limited Ruq  Result Date: 10/23/2016 CLINICAL DATA:  Abdominal pain with nausea and vomiting pain in pain History of systemic lupus erythematosus EXAM: US ABDOMEN LIMITED - RIGHT UPPER QUADRANT COMPARISON:  CT abdomen and pelvis July 01, 2016 FINDINGS: Gallbladder: No gallstones or wall thickening visualized. There is no pericholecystic fluid. No sonographic Murphy sign noted by sonographer. Common bile duct: Diameter: 3 mm. No intrahepatic or extrahepatic biliary duct dilatation. Liver: No focal lesion identified. Within normal limits in parenchymal echogenicity. Right kidney is noted to be somewhat echogenic. IMPRESSION: Somewhat echogenic right kidney. This is a finding that may be seen  with medical renal disease. Appropriate laboratory correlation advised. Study otherwise unremarkable. Electronically Signed   By: Bretta Bang III M.D.   On: 10/23/2016 08:31    Harvest Deist, DO  [facility] Pager [phone]  If 7PM-7AM, please contact night-coverage [email or web address] Password [facility] 10/28/2016, 3:38 PM   LOS: 3 days

## 2016-10-29 LAB — GLUCOSE, CAPILLARY
GLUCOSE-CAPILLARY: 107 mg/dL — AB (ref 65–99)
GLUCOSE-CAPILLARY: 141 mg/dL — AB (ref 65–99)
Glucose-Capillary: 103 mg/dL — ABNORMAL HIGH (ref 65–99)
Glucose-Capillary: 154 mg/dL — ABNORMAL HIGH (ref 65–99)

## 2016-10-29 LAB — CBC
HCT: 42.5 % (ref 39.0–52.0)
HEMOGLOBIN: 14.1 g/dL (ref 13.0–17.0)
MCH: 27.2 pg (ref 26.0–34.0)
MCHC: 33.2 g/dL (ref 30.0–36.0)
MCV: 82 fL (ref 78.0–100.0)
Platelets: 228 10*3/uL (ref 150–400)
RBC: 5.18 MIL/uL (ref 4.22–5.81)
RDW: 14.1 % (ref 11.5–15.5)
WBC: 4.5 10*3/uL (ref 4.0–10.5)

## 2016-10-29 LAB — APTT: APTT: 36 s (ref 24–36)

## 2016-10-29 LAB — PROTIME-INR
INR: 1.17
PROTHROMBIN TIME: 14.9 s (ref 11.4–15.2)

## 2016-10-29 MED ORDER — ENSURE ENLIVE PO LIQD: 237.0000 mL | Freq: Two times a day (BID) | ORAL | 0 refills | Status: DC

## 2016-10-29 MED ORDER — PANTOPRAZOLE SODIUM 40 MG PO TBEC
40.0000 mg | DELAYED_RELEASE_TABLET | Freq: Two times a day (BID) | ORAL | Status: DC
Start: 2016-10-29 — End: 2016-10-30
  Administered 2016-10-29 – 2016-10-30 (×2): 40 mg via ORAL
  Filled 2016-10-29 (×2): qty 1

## 2016-10-29 MED ORDER — OXYCODONE HCL 5 MG PO TABS
5.0000 mg | ORAL_TABLET | Freq: Four times a day (QID) | ORAL | Status: DC | PRN
  Administered 2016-10-29 – 2016-10-30 (×4): 5 mg via ORAL
  Filled 2016-10-29 (×4): qty 1

## 2016-10-29 NOTE — Discharge Summary (Addendum)
Physician Discharge Summary  [name] ZOX:[id number] DOB: 1965/07/15 DOA: 10/24/2016  PCP: [facility]  Admit date: 10/24/2016 Discharge date: 10/29/2016  Admitted From: Home Disposition:  Home   Recommendations for Outpatient Follow-up:  1. Follow up with PCP in 1-2 weeks 2. Please obtain BMP/CBC in one week 3. Follow up with hematology, Dr. Candise Che in one week    Discharge Condition: Stable CODE STATUS: FULL Diet recommendation: Heart Healthy / Carb Modified   Brief/Interim Summary: 50 y.o.malewith history of diabetes mellitus type 2, hypertension, COPD and chronic pain presents with the ER with complaints of persistent pain in the abdomen with nausea vomiting and rectal bleedingof 2-3 day duration. Patient also complains of bleeding gums and hematuriafor the past 2-3 days. Patient also complained of abdominal pain. He visited the emergency department on 10/23/2016. CT of the abdomen and pelvis at that time was negative for any acute abnormalities. Fatty liver was noted. Since admission, the patient states that his emesis has improved and feels hungry. He states that his abdominal pain has also improved since admission. His last bowel movement was on 10/23/2016. He was noted to have a coagulopathy with INR greater than 10 and PTT 147. Urinalysis also showed TNTC RBC.Notably, the patient states that he has been handling "[employer]-Con" to try to get rid of rats in his backyard. He states that he has been handling the [employer]-Confor the past 5 years  Discharge Diagnoses:  Coagulopathy--superwarfarin toxicity -presenting INR >10, PTT 147 -PT and PTT mixing studies corrected-->do not suggest presence of inhibitor -fibrinogen 419 -secondary to anticoagulant rodenticide toxicity -appreciatehematology--Dr. Candise Che -1 unit FFP and vitamin K 10mg  IV given initially--received 2 units FFP for the hospitalization -INR starting to trend down after increasing vitamin K  supplementation - bleeding gums and hematuria--improved -FOBT--negative initially but repeat was positive -initially vitamin K to 90 mg daily (40 mg [place], 50 mg po)-->transition to all po vitaming K -INR remained stable without any new signs of systemic bleeding after transitioning to 100% po vitamin K -plan to discharge pt with vitamin K-->20 mg po tid and follow up with Dr. Candise Che in one week -recheck INR, PTT daily-->improving -INR 1.10 on day of d/c -(Kcentra)25-50 international units per kilogram for severe bleeding--did not require -Hgb dropped initially, but stable since then-->Hgb 13.6 on day of d/c  Hematuria -10/23/16 CT abd/pelvis with contrast--no acute findings -appreciate urology consult-->follow up as out patient -?coagulopathy unmasking underlying bladder issue? -UA with TNTC RBC on 4/23 -ultimately resolved  Left Flank hematoma/R-leg hematoma -10/26/16 confirmed by CT abd/pelvis -Fall precautions and avoiding activities that could cause trauma due to high risk of bleeding. -stable for several days  ?Rectal bleeding/hematochezia -FOBT negative on 4/24 -repeat FOBT--positive -consulted [facility]-->outpt colonoscopy -improved/resolved at time of d/c -Hgb remained stable after initial drop  Pyuria -pt without dysuria -repeat UA--pyuria resolved  Diabetes mellitus  -check A1C--5.8 -novolog sliding scale -holding metformin and amaryl-->not on his MAR, but pt states he is taking these-->resume after d/c -CBGs well controlled  HTN -not on any medications outpt -continueamlodipine-->increase to 10 mg daily  COPD -stable on RA  Chronic pain syndrome -continue home dose suboxone -I have reviewed NCSRS data base--pt getting only from one source -pain controlled -follow with outpt pain management specialist  Moderate malnutrition -continue supplements    Discharge Instructions  Discharge Instructions    Diet Carb Modified    Complete by:  As  directed    Increase activity slowly  Complete by:  As directed      Allergies as of 10/30/2016      Reactions   Darvocet [propoxyphene N-acetaminophen] Itching, Nausea And Vomiting   Onion Itching      Medication List    STOP taking these medications   levofloxacin 750 MG tablet Commonly known as:  LEVAQUIN   metoCLOPramide 10 MG tablet Commonly known as:  REGLAN   ondansetron 4 MG tablet Commonly known as:  ZOFRAN     TAKE these medications   albuterol 108 (90 Base) MCG/ACT inhaler Commonly known as:  PROVENTIL HFA;VENTOLIN HFA Inhale 2 puffs into the lungs every 6 (six) hours as needed for wheezing or shortness of breath. What changed:  Another medication with the same name was removed. Continue taking this medication, and follow the directions you see here.   amLODipine 10 MG tablet Commonly known as:  NORVASC Take 1 tablet (10 mg total) by mouth daily. Start taking on:  10/31/2016   feeding supplement (ENSURE ENLIVE) Liqd Take 237 mLs by mouth 2 (two) times daily between meals.   Fluticasone-Salmeterol 250-50 MCG/DOSE Aepb Commonly known as:  ADVAIR Inhale 1 puff into the lungs 2 (two) times daily.   omeprazole 20 MG capsule Commonly known as:  PRILOSEC Take 1 capsule (20 mg total) by mouth daily.   phytonadione 5 MG tablet Commonly known as:  VITAMIN K Take 4 tablets (20 mg total) by mouth 3 (three) times daily.   promethazine 25 MG tablet Commonly known as:  PHENERGAN Take 1 tablet (25 mg total) by mouth every 6 (six) hours as needed for nausea or vomiting.   ranitidine 150 MG tablet Commonly known as:  ZANTAC Take 1 tablet (150 mg total) by mouth 2 (two) times daily.   sucralfate 1 GM/10ML suspension Commonly known as:  CARAFATE Take 10 mLs (1 g total) by mouth 4 (four) times daily -  with meals and at bedtime.      Follow-up Information    Wyvonnia Lora, MD Follow up in 1 week(s).   Specialties:  Hematology, Oncology Contact information: [address] [place] [address] [phone]        MACDIARMID,SCOTT A, MD Follow up in 2 week(s).   Specialty:  Urology Contact information: [address] [place] [address] [phone]        EDWARDS JR,JAMES L, MD Follow up in 2 week(s).   Specialty:  Gastroenterology Contact information: [address] [place] [address] [phone]          Allergies  Allergen Reactions  . Darvocet [Propoxyphene N-Acetaminophen] Itching and Nausea And Vomiting  . Onion Itching    Consultations:  Hematology--Dr. [facility]  Urology--MacDiarmid   Procedures/Studies: Ct Abdomen Pelvis Wo Contrast  Result Date: 10/27/2016 CLINICAL DATA:  Hypertension, persistent pain do the abdomen. Nausea vomiting and rectal bleeding EXAM: CT ABDOMEN AND PELVIS WITHOUT CONTRAST TECHNIQUE: Multidetector CT imaging of the abdomen and pelvis was performed following the standard protocol without IV contrast. COMPARISON:  CT 10/23/2016 FINDINGS: Lower chest: Lung bases are clear. Hepatobiliary: No focal hepatic lesions noncontrast exam. Normal gallbladder Pancreas: Pancreas is normal. No ductal dilatation. No pancreatic inflammation. Spleen: Normal spleen Adrenals/urinary tract: Bilateral adrenal adenomas no nephrolithiasis or ureterolithiasis. No bladder calculi Stomach/Bowel: Stomach, small-bowel and cecum are normal. The appendix is not identified but there is no pericecal inflammation to suggest appendicitis. The colon and rectosigmoid colon are normal. Vascular/Lymphatic: Abdominal aorta is normal caliber. There  is no retroperitoneal or periportal lymphadenopathy. No pelvic lymphadenopathy. Reproductive: Uterus normal. Other: No free fluid. Musculoskeletal: No aggressive osseous lesion. Posterior lumbar fusion and decompression. Significant streak artifact generated from the metallic hardware. 2.8 x 1.6 cm hematoma superficial to the LEFT iliac wing (image 51, series  2). IMPRESSION: 1. Subcutaneous hematoma LEFT flank adjacent to the iliac wing *No bowel abnormality identified on this noncontrast exam.  I *Atherosclerotic calcification of the aorta. Electronically Signed   By: Genevive Bi M.D.   On: 10/27/2016 09:13   Ct Abdomen Pelvis W Contrast  Result Date: 10/23/2016 CLINICAL DATA:  Nausea and abdominal pain EXAM: CT ABDOMEN AND PELVIS WITH CONTRAST TECHNIQUE: Multidetector CT imaging of the abdomen and pelvis was performed using the standard protocol following bolus administration of intravenous contrast. CONTRAST:  100 mL ISOVUE-300 IOPAMIDOL (ISOVUE-300) INJECTION 61% COMPARISON:  Ultrasound from earlier in the same day, 07/01/2016 FINDINGS: Lower chest: Minimal dependent atelectatic changes are noted. Hepatobiliary: The liver is diffusely decreased in attenuation consistent fatty infiltration. The gallbladder is well distended without focal abnormality. Pancreas: Unremarkable. No pancreatic ductal dilatation or surrounding inflammatory changes. Spleen: Spleen is within normal limits. Adrenals/Urinary Tract: Adrenal glands are unremarkable. Kidneys are normal, without renal calculi, focal lesion, or hydronephrosis. Bladder is decompressed. Stomach/Bowel: Scattered diverticular changes noted. No findings to suggest acute no diverticulitis is seen. Vascular/Lymphatic: Aortic atherosclerosis. No enlarged abdominal or pelvic lymph nodes. Reproductive: Prostate is unremarkable. Other: No abdominal wall hernia or abnormality. No abdominopelvic ascites. Musculoskeletal: Postsurgical changes are noted in the lumbar spine and bilateral proximal femurs. No acute abnormality is seen. IMPRESSION: Chronic changes as described above stable from the previous exam. No acute abnormality noted. Electronically Signed   By: Alcide Clever M.D.   On: 10/23/2016 11:14   US Abdomen Limited Ruq  Result Date: 10/23/2016 CLINICAL DATA:  Abdominal pain with nausea and vomiting pain in  pain History of systemic lupus erythematosus EXAM: US ABDOMEN LIMITED - RIGHT UPPER QUADRANT COMPARISON:  CT abdomen and pelvis July 01, 2016 FINDINGS: Gallbladder: No gallstones or wall thickening visualized. There is no pericholecystic fluid. No sonographic Murphy sign noted by sonographer. Common bile duct: Diameter: 3 mm. No intrahepatic or extrahepatic biliary duct dilatation. Liver: No focal lesion identified. Within normal limits in parenchymal echogenicity. Right kidney is noted to be somewhat echogenic. IMPRESSION: Somewhat echogenic right kidney. This is a finding that may be seen with medical renal disease. Appropriate laboratory correlation advised. Study otherwise unremarkable. Electronically Signed   By: Bretta Bang III M.D.   On: 10/23/2016 08:31        Discharge Exam: Vitals:   10/29/16 2114 10/30/16 0555  BP: (!) 154/82 135/88  Pulse: 71 62  Resp: 18 20  Temp: 98.4 F (36.9 C) 98.5 F (36.9 C)   Vitals:   10/29/16 0521 10/29/16 1353 10/29/16 2114 10/30/16 0555  BP: (!) 147/79 (!) 139/93 (!) 154/82 135/88  Pulse: 96  71 62  Resp: 20 18 18 20   Temp: 98.7 F (37.1 C) 98.8 F (37.1 C) 98.4 F (36.9 C) 98.5 F (36.9 C)  TempSrc: Oral Oral Oral Oral  SpO2: 99% 100% 100% 98%  Weight:      Height:        General: Pt is alert, awake, not in acute distress Cardiovascular: RRR, S1/S2 +, no rubs, no gallops Respiratory: CTA bilaterally, no wheezing, no rhonchi Abdominal: Soft, NT, ND, bowel sounds + Extremities: no edema, no cyanosis;  Left flank and right thigh hematomas  remained within circumscribed areas   The results of significant diagnostics from this hospitalization (including imaging, microbiology, ancillary and laboratory) are listed below for reference.    Significant Diagnostic Studies: Ct Abdomen Pelvis Wo Contrast  Result Date: 10/27/2016 CLINICAL DATA:  Hypertension, persistent pain do the abdomen. Nausea vomiting and rectal bleeding EXAM: CT  ABDOMEN AND PELVIS WITHOUT CONTRAST TECHNIQUE: Multidetector CT imaging of the abdomen and pelvis was performed following the standard protocol without IV contrast. COMPARISON:  CT 10/23/2016 FINDINGS: Lower chest: Lung bases are clear. Hepatobiliary: No focal hepatic lesions noncontrast exam. Normal gallbladder Pancreas: Pancreas is normal. No ductal dilatation. No pancreatic inflammation. Spleen: Normal spleen Adrenals/urinary tract: Bilateral adrenal adenomas no nephrolithiasis or ureterolithiasis. No bladder calculi Stomach/Bowel: Stomach, small-bowel and cecum are normal. The appendix is not identified but there is no pericecal inflammation to suggest appendicitis. The colon and rectosigmoid colon are normal. Vascular/Lymphatic: Abdominal aorta is normal caliber. There is no retroperitoneal or periportal lymphadenopathy. No pelvic lymphadenopathy. Reproductive: Uterus normal. Other: No free fluid. Musculoskeletal: No aggressive osseous lesion. Posterior lumbar fusion and decompression. Significant streak artifact generated from the metallic hardware. 2.8 x 1.6 cm hematoma superficial to the LEFT iliac wing (image 51, series 2). IMPRESSION: 1. Subcutaneous hematoma LEFT flank adjacent to the iliac wing *No bowel abnormality identified on this noncontrast exam.  I *Atherosclerotic calcification of the aorta. Electronically Signed   By: Genevive Bi M.D.   On: 10/27/2016 09:13   Ct Abdomen Pelvis W Contrast  Result Date: 10/23/2016 CLINICAL DATA:  Nausea and abdominal pain EXAM: CT ABDOMEN AND PELVIS WITH CONTRAST TECHNIQUE: Multidetector CT imaging of the abdomen and pelvis was performed using the standard protocol following bolus administration of intravenous contrast. CONTRAST:  100 mL ISOVUE-300 IOPAMIDOL (ISOVUE-300) INJECTION 61% COMPARISON:  Ultrasound from earlier in the same day, 07/01/2016 FINDINGS: Lower chest: Minimal dependent atelectatic changes are noted. Hepatobiliary: The liver is  diffusely decreased in attenuation consistent fatty infiltration. The gallbladder is well distended without focal abnormality. Pancreas: Unremarkable. No pancreatic ductal dilatation or surrounding inflammatory changes. Spleen: Spleen is within normal limits. Adrenals/Urinary Tract: Adrenal glands are unremarkable. Kidneys are normal, without renal calculi, focal lesion, or hydronephrosis. Bladder is decompressed. Stomach/Bowel: Scattered diverticular changes noted. No findings to suggest acute no diverticulitis is seen. Vascular/Lymphatic: Aortic atherosclerosis. No enlarged abdominal or pelvic lymph nodes. Reproductive: Prostate is unremarkable. Other: No abdominal wall hernia or abnormality. No abdominopelvic ascites. Musculoskeletal: Postsurgical changes are noted in the lumbar spine and bilateral proximal femurs. No acute abnormality is seen. IMPRESSION: Chronic changes as described above stable from the previous exam. No acute abnormality noted. Electronically Signed   By: Alcide Clever M.D.   On: 10/23/2016 11:14   US Abdomen Limited Ruq  Result Date: 10/23/2016 CLINICAL DATA:  Abdominal pain with nausea and vomiting pain in pain History of systemic lupus erythematosus EXAM: US ABDOMEN LIMITED - RIGHT UPPER QUADRANT COMPARISON:  CT abdomen and pelvis July 01, 2016 FINDINGS: Gallbladder: No gallstones or wall thickening visualized. There is no pericholecystic fluid. No sonographic Murphy sign noted by sonographer. Common bile duct: Diameter: 3 mm. No intrahepatic or extrahepatic biliary duct dilatation. Liver: No focal lesion identified. Within normal limits in parenchymal echogenicity. Right kidney is noted to be somewhat echogenic. IMPRESSION: Somewhat echogenic right kidney. This is a finding that may be seen with medical renal disease. Appropriate laboratory correlation advised. Study otherwise unremarkable. Electronically Signed   By: Bretta Bang III M.D.   On: 10/23/2016 08:31  Microbiology: Recent Results (from the past 240 hour(s))  Culture, Urine     Status: Abnormal   Collection Time: 10/24/16  6:31 PM  Result Value Ref Range Status   Specimen Description URINE, RANDOM  Final   Special Requests NONE  Final   Culture MULTIPLE SPECIES PRESENT, SUGGEST RECOLLECTION (A)  Final   Report Status 10/27/2016 FINAL  Final     Labs: Basic Metabolic Panel:  Recent Labs Lab 10/24/16 0239 10/24/16 1841 10/25/16 0518 10/30/16 0609  NA 136 136 136 135  K 3.6 3.7 3.6 4.6  CL 104 101 102 98*  CO2 24 24 24 29   GLUCOSE 98 102* 94 113*  BUN 12 9 13  27*  CREATININE 0.92 0.97 0.95 0.85  CALCIUM 8.9 9.1 9.0 9.7   Liver Function Tests:  Recent Labs Lab 10/24/16 0239 10/24/16 1841  AST 18 19  ALT 11* 12*  ALKPHOS 54 58  BILITOT 0.4 0.5  PROT 8.0 8.7*  ALBUMIN 4.1 4.4    Recent Labs Lab 10/24/16 0239 10/24/16 1841  LIPASE 33 63*   No results for input(s): AMMONIA in the last 168 hours. CBC:  Recent Labs Lab 10/26/16 0600 10/26/16 1733 10/28/16 0532 10/29/16 0933 10/30/16 0609  WBC 4.5 4.6 4.6 4.5 4.2  HGB 13.4 14.1 13.4 14.1 13.6  HCT 41.0 43.1 41.0 42.5 41.4  MCV 79.2 82.3 81.5 82.0 82.0  PLT 192 218 218 228 254   Cardiac Enzymes:  Recent Labs Lab 10/24/16 2027  CKTOTAL 179   BNP: Invalid input(s): POCBNP CBG:  Recent Labs Lab 10/29/16 0808 10/29/16 1128 10/29/16 1636 10/29/16 2118 10/30/16 0748  GLUCAP 107* 103* 141* 154* 109*    Time coordinating discharge:  Greater than 30 minutes  Signed:  Razia Screws, DO [facility] Pager: [phone] 10/30/2016, 11:46 AM

## 2016-10-29 NOTE — Progress Notes (Signed)
PROGRESS NOTE  [name] NWG:[id number] DOB: 06-21-1966 DOA: 10/24/2016 PCP: [facility]  Brief History: 50 y.o.malewith history of diabetes mellitus type 2, hypertension, COPD and chronic pain presents with the ER with complaints of persistent pain in the abdomen with nausea vomiting and rectal bleedingof 2-3 day duration. Patient also complains of bleeding gums and hematuriafor the past 2-3 days. Patient also complained of abdominal pain. He visited the emergency department on 10/23/2016. CT of the abdomen and pelvis at that time was negative for any acute abnormalities. Fatty liver was noted. Since admission, the patient states that his emesis has improved and feels hungry. He states that his abdominal pain has also improved since admission. His last bowel movement was on 10/23/2016. He was noted to have a coagulopathy with INR greater than 10 and PTT 147. Urinalysis also showed TNTC RBC.Notably, the patient states that he has been handling "D-Con" to try to get rid of rats in his backyard. 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 Candise Che -1 unit FFP and vitamin K 10mg  IV given initially -INR starting to trend down - bleeding gums and hematuria--improved -FOBT--negative -initially vitamin K to 90 mg daily (40 mg Lindsay, 50 mg po) per hematology recommendation-->transition to po vitaming K -recheck INR, PTT daily-->improving -(Kcentra)25-50 international units per kilogram for severe bleeding -Hgb dropped initially, but stable since then -am CBC  Hematuria -10/23/16 CT abd/pelvis with contrast--no acute findings -appreciate urology consult-->follow up as out patient -?coagulopathy unmasking underlying bladder issue? -UA with TNTC RBC on 4/23  Left Flank  hematoma/R-leg hematoma -10/26/16 confirmed by CT abd/pelvis -Fall precautions and avoiding activities that could cause trauma due to high risk of bleeding. -stable  ?Rectal bleeding/hematochezia -FOBT negative on 4/24 -repeat FOBT--positive -consult [facility]-->outpt colonoscopy  Pyuria -pt without dysuria -repeat UA--pyuria resolved  Diabetes mellitus  -check A1C--5.8 -novolog sliding scale -holding metformin and amaryl -CBGs well controlled  HTN -not on any medications outpt -continueamlodipine  COPD -stable on RA  Chronic pain syndrome -continue home dose suboxone -I have reviewed NCSRS data base--pt getting only from one source -pain controlled  Moderate malnutrition -continue supplements     Disposition Plan: 4/30 or 5/1 Family Communication: No Family at bedside  Consultants: Hematology--Kale; Urology --McDiarmid; [facility]  Code Status: FULL  DVT Prophylaxis: SCDs    Subjective: He is doing better overall. He had another bowel movement without any blood. Denies any chest pain, shortness breath, nausea, vomiting, diarrhea. Hematuria has improved. His bleeding gums have improved. Denies any headache or visual disturbance.  Objective: Vitals:   10/28/16 0829 10/28/16 1405 10/28/16 2205 10/29/16 0521  BP: (!) 148/98 (!) 136/100 137/90 (!) 147/79  Pulse: 71 81 80 96  Resp: 18 18 20 20   Temp: 98.3 F (36.8 C) 98.4 F (36.9 C) 98.7 F (37.1 C) 98.7 F (37.1 C)  TempSrc: Oral Oral Oral Oral  SpO2: 100% 100% 100% 99%  Weight:      Height:        Intake/Output Summary (Last 24 hours) at 10/29/16 1212 Last data filed at 10/29/16 0600  Gross per 24 hour  Intake              240 ml  Output             1525 ml  Net            -1285 ml   Weight change:  Exam:   General:  Pt is alert, follows commands appropriately, not in acute distress  HEENT: No icterus, No thrush, No neck mass, [place]/AT  Cardiovascular: RRR, S1/S2, no  rubs, no gallops  Respiratory: CTA bilaterally, no wheezing, no crackles, no rhonchi  Abdomen: Soft/+BS, non tender, non distended, no guarding  Extremities: No edema, No lymphangitis, No petechiae, No rashes, no synovitis   Data Reviewed: I have personally reviewed following labs and imaging studies Basic Metabolic Panel:  Recent Labs Lab 10/23/16 0526 10/24/16 0239 10/24/16 1841 10/25/16 0518  NA 136 136 136 136  K 3.8 3.6 3.7 3.6  CL 104 104 101 102  CO2 24 24 24 24   GLUCOSE 95 98 102* 94  BUN 11 12 9 13   CREATININE 1.02 0.92 0.97 0.95  CALCIUM 8.9 8.9 9.1 9.0   Liver Function Tests:  Recent Labs Lab 10/23/16 0526 10/24/16 0239 10/24/16 1841  AST 19 18 19   ALT 10* 11* 12*  ALKPHOS 57 54 58  BILITOT 0.3 0.4 0.5  PROT 7.8 8.0 8.7*  ALBUMIN 3.9 4.1 4.4    Recent Labs Lab 10/23/16 0526 10/24/16 0239 10/24/16 1841  LIPASE 28 33 63*   No results for input(s): AMMONIA in the last 168 hours. Coagulation Profile:  Recent Labs Lab 10/26/16 1733 10/27/16 0532 10/27/16 1648 10/28/16 0532 10/29/16 0933  INR 5.37* 2.53 1.82 1.38 1.17   CBC:  Recent Labs Lab 10/25/16 0518 10/26/16 0600 10/26/16 1733 10/28/16 0532 10/29/16 0933  WBC 4.7 4.5 4.6 4.6 4.5  HGB 14.8 13.4 14.1 13.4 14.1  HCT 43.3 41.0 43.1 41.0 42.5  MCV 78.2 79.2 82.3 81.5 82.0  PLT 204 192 218 218 228   Cardiac Enzymes:  Recent Labs Lab 10/24/16 2027  CKTOTAL 179   BNP: Invalid input(s): POCBNP CBG:  Recent Labs Lab 10/28/16 1116 10/28/16 1704 10/28/16 2203 10/29/16 0808 10/29/16 1128  GLUCAP 123* 166* 92 107* 103*   HbA1C: No results for input(s): HGBA1C in the last 72 hours. Urine analysis:    Component Value Date/Time   COLORURINE RED (A) 10/25/2016 1831   APPEARANCEUR CLOUDY (A) 10/25/2016 1831   LABSPEC 1.021 10/25/2016 1831   PHURINE 5.0 10/25/2016 1831   GLUCOSEU NEGATIVE 10/25/2016 1831   HGBUR MODERATE (A) 10/25/2016 1831   BILIRUBINUR NEGATIVE  10/25/2016 1831   KETONESUR 5 (A) 10/25/2016 1831   PROTEINUR 100 (A) 10/25/2016 1831   UROBILINOGEN 1.0 03/20/2014 2351   NITRITE NEGATIVE 10/25/2016 1831   LEUKOCYTESUR NEGATIVE 10/25/2016 1831   Sepsis Labs: @LABRCNTIP (procalcitonin:4,lacticidven:4) ) Recent Results (from the past 240 hour(s))  Culture, Urine     Status: Abnormal   Collection Time: 10/24/16  6:31 PM  Result Value Ref Range Status   Specimen Description URINE, RANDOM  Final   Special Requests NONE  Final   Culture MULTIPLE SPECIES PRESENT, SUGGEST RECOLLECTION (A)  Final   Report Status 10/27/2016 FINAL  Final     Scheduled Meds: . amLODipine  5 mg Oral Daily  . buprenorphine-naloxone  2 tablet Sublingual BID  . chlorhexidine  15 mL Mouth Rinse BID  . feeding supplement (ENSURE ENLIVE)  237 mL Oral BID BM  . insulin aspart  0-9 Units Subcutaneous TID WC  . mouth rinse  15 mL Mouth Rinse q12n4p  . multivitamin with minerals  1 tablet Oral Daily  . pantoprazole  40 mg Oral BID  .  phytonadione  20 mg Oral TID   Continuous Infusions:  Procedures/Studies: Ct Abdomen Pelvis Wo Contrast  Result Date: 10/27/2016 CLINICAL DATA:  Hypertension, persistent pain do the abdomen. Nausea vomiting and rectal bleeding EXAM: CT ABDOMEN AND PELVIS WITHOUT CONTRAST TECHNIQUE: Multidetector CT imaging of the abdomen and pelvis was performed following the standard protocol without IV contrast. COMPARISON:  CT 10/23/2016 FINDINGS: Lower chest: Lung bases are clear. Hepatobiliary: No focal hepatic lesions noncontrast exam. Normal gallbladder Pancreas: Pancreas is normal. No ductal dilatation. No pancreatic inflammation. Spleen: Normal spleen Adrenals/urinary tract: Bilateral adrenal adenomas no nephrolithiasis or ureterolithiasis. No bladder calculi Stomach/Bowel: Stomach, small-bowel and cecum are normal. The appendix is not identified but there is no pericecal inflammation to suggest appendicitis. The colon and rectosigmoid colon are  normal. Vascular/Lymphatic: Abdominal aorta is normal caliber. There is no retroperitoneal or periportal lymphadenopathy. No pelvic lymphadenopathy. Reproductive: Uterus normal. Other: No free fluid. Musculoskeletal: No aggressive osseous lesion. Posterior lumbar fusion and decompression. Significant streak artifact generated from the metallic hardware. 2.8 x 1.6 cm hematoma superficial to the LEFT iliac wing (image 51, series 2). IMPRESSION: 1. Subcutaneous hematoma LEFT flank adjacent to the iliac wing *No bowel abnormality identified on this noncontrast exam.  I *Atherosclerotic calcification of the aorta. Electronically Signed   By: Genevive Bi M.D.   On: 10/27/2016 09:13   Ct Abdomen Pelvis W Contrast  Result Date: 10/23/2016 CLINICAL DATA:  Nausea and abdominal pain EXAM: CT ABDOMEN AND PELVIS WITH CONTRAST TECHNIQUE: Multidetector CT imaging of the abdomen and pelvis was performed using the standard protocol following bolus administration of intravenous contrast. CONTRAST:  100 mL ISOVUE-300 IOPAMIDOL (ISOVUE-300) INJECTION 61% COMPARISON:  Ultrasound from earlier in the same day, 07/01/2016 FINDINGS: Lower chest: Minimal dependent atelectatic changes are noted. Hepatobiliary: The liver is diffusely decreased in attenuation consistent fatty infiltration. The gallbladder is well distended without focal abnormality. Pancreas: Unremarkable. No pancreatic ductal dilatation or surrounding inflammatory changes. Spleen: Spleen is within normal limits. Adrenals/Urinary Tract: Adrenal glands are unremarkable. Kidneys are normal, without renal calculi, focal lesion, or hydronephrosis. Bladder is decompressed. Stomach/Bowel: Scattered diverticular changes noted. No findings to suggest acute no diverticulitis is seen. Vascular/Lymphatic: Aortic atherosclerosis. No enlarged abdominal or pelvic lymph nodes. Reproductive: Prostate is unremarkable. Other: No abdominal wall hernia or abnormality. No abdominopelvic  ascites. Musculoskeletal: Postsurgical changes are noted in the lumbar spine and bilateral proximal femurs. No acute abnormality is seen. IMPRESSION: Chronic changes as described above stable from the previous exam. No acute abnormality noted. Electronically Signed   By: Alcide Clever M.D.   On: 10/23/2016 11:14   US Abdomen Limited Ruq  Result Date: 10/23/2016 CLINICAL DATA:  Abdominal pain with nausea and vomiting pain in pain History of systemic lupus erythematosus EXAM: US ABDOMEN LIMITED - RIGHT UPPER QUADRANT COMPARISON:  CT abdomen and pelvis July 01, 2016 FINDINGS: Gallbladder: No gallstones or wall thickening visualized. There is no pericholecystic fluid. No sonographic Murphy sign noted by sonographer. Common bile duct: Diameter: 3 mm. No intrahepatic or extrahepatic biliary duct dilatation. Liver: No focal lesion identified. Within normal limits in parenchymal echogenicity. Right kidney is noted to be somewhat echogenic. IMPRESSION: Somewhat echogenic right kidney. This is a finding that may be seen with medical renal disease. Appropriate laboratory correlation advised. Study otherwise unremarkable. Electronically Signed   By: Bretta Bang III M.D.   On: 10/23/2016 08:31    Lysha Schrade, DO  [facility] Pager [phone]  If 7PM-7AM, please contact night-coverage [email or web address] Password [facility] 10/29/2016,  12:12 PM   LOS: 4 days

## 2016-10-29 NOTE — Consult Note (Signed)
[facility] CONSULT Reason for consult: rectal bleeding Referring Physician: [facility]. PCP: Logan Bores [facility]. Primary G.I.: nonpatient unassigned  [name] is an 51 y.o. male.  HPI: 51 year old who has a history of diabetes type II that's reasonably well controlled, COPD and hypertension he came in with severe nausea vomiting abdominal pain profuse rectal bleeding and gross hematuria. Workup revealed that his INR was 51. The patient was not on any Coumadin containing medications and at this point after evaluation from hematology it appears that he had been spreading rat poison around his utility building a couple days before. This rat poison contained Coumadin type medication. The patient insists that he wore gloves while handling this but the only way he could have gotten this into his system would've been by skin absorption. He has had massive amounts of IV and oral vitamin K as well as FFP. His last pro time was down to 17. He's had no further rectal bleeding. The patient notes that he has been somewhat constipated on occasion but is not had any rectal bleeding prior to this episode. He has a strong family history of breast cancer in several members but no family history of colon cancer to the best of his knowledge. He is unaware of anyone having had colon polyps. He's 51 years old has never had colonoscopy. No abdominal pain dyspepsia etc. He is eating here in the hospital without any symptoms.  Past Medical History:  Diagnosis Date  . Complication of anesthesia   . COPD (chronic obstructive pulmonary disease) (HCC)    chronic Bronchhititis  . Diabetes mellitus   . Hypertension   . 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  . Pneumonia   . Seizures (HCC)    last one 3 years ago.  Md discontinued meds- Dr Anne Hahn    Past Surgical History:  Procedure Laterality Date  . ANTERIOR CERVICAL DECOMP/DISCECTOMY FUSION  12/20/2011   Procedure:  ANTERIOR CERVICAL DECOMPRESSION/DISCECTOMY FUSION 2 LEVELS;  Surgeon: Mariam Dollar, MD;  Location: [facility];  Service: Neurosurgery;  Laterality: N/A;  Cervical four-five, five-six anterior cervical decompression/discectomy fusion with plating  . BACK SURGERY     5 from degenerative disease..    . HIP SURGERY     rods to each hip- hip can ot of socket.  . TONSILLECTOMY    . WRIST SURGERY     repair due to crush    Family History  Problem Relation Age of Onset  . Diabetes Mellitus II Mother     Social History:  reports that he quit smoking about 5 weeks ago. He smoked 0.50 packs per day. He has never used smokeless tobacco. He reports that he does not drink alcohol or use drugs.  Allergies:  Allergies  Allergen Reactions  . Darvocet [Propoxyphene N-Acetaminophen] Itching and Nausea And Vomiting  . Onion Itching    Medications; Prior to Admission medications   Medication Sig Start Date End Date Taking? Authorizing Provider  albuterol (PROVENTIL HFA;VENTOLIN HFA) 108 (90 Base) MCG/ACT inhaler Inhale 2 puffs into the lungs every 6 (six) hours as needed for wheezing or shortness of breath.   Yes Historical Provider, MD  Fluticasone-Salmeterol (ADVAIR) 250-50 MCG/DOSE AEPB Inhale 1 puff into the lungs 2 (two) times daily.   Yes Historical Provider, MD  omeprazole (PRILOSEC) 20 MG capsule Take 1 capsule (20 mg total) by mouth daily. 10/23/16 11/06/16 Yes Shaune Pollack, MD  ranitidine (ZANTAC) 150 MG tablet Take  1 tablet (150 mg total) by mouth 2 (two) times daily. 10/23/16 11/06/16 Yes Shaune Pollack, MD  sucralfate (CARAFATE) 1 GM/10ML suspension Take 10 mLs (1 g total) by mouth 4 (four) times daily -  with meals and at bedtime. 10/23/16  Yes Shaune Pollack, MD  albuterol (PROVENTIL HFA;VENTOLIN HFA) 108 (90 Base) MCG/ACT inhaler Inhale 2 puffs into the lungs every 4 (four) hours as needed for wheezing or shortness of breath. Patient not taking: Reported on 10/23/2016 09/08/15   Hayden Rasmussen, NP   levofloxacin (LEVAQUIN) 750 MG tablet Take 1 tablet (750 mg total) by mouth daily. X 7 days Patient not taking: Reported on 10/23/2016 07/04/16   Tomasita Crumble, MD  metoCLOPramide (REGLAN) 10 MG tablet Take 1 tablet (10 mg total) by mouth every 8 (eight) hours as needed for nausea (nausea/headache). Patient not taking: Reported on 10/24/2016 10/24/16   Tomasita Crumble, MD  ondansetron (ZOFRAN) 4 MG tablet Take 1 tablet (4 mg total) by mouth every 8 (eight) hours as needed for nausea or vomiting. Patient not taking: Reported on 10/23/2016 06/29/16   Shaune Pollack, MD  promethazine (PHENERGAN) 25 MG tablet Take 1 tablet (25 mg total) by mouth every 6 (six) hours as needed for nausea or vomiting. Patient not taking: Reported on 10/23/2016 07/02/16   Renne Crigler, PA-C   . amLODipine  5 mg Oral Daily  . buprenorphine-naloxone  2 tablet Sublingual BID  . chlorhexidine  15 mL Mouth Rinse BID  . feeding supplement (ENSURE ENLIVE)  237 mL Oral BID BM  . insulin aspart  0-9 Units Subcutaneous TID WC  . mouth rinse  15 mL Mouth Rinse q12n4p  . multivitamin with minerals  1 tablet Oral Daily  . pantoprazole (PROTONIX) IV  40 mg Intravenous Q12H  . phytonadione  10 mg Subcutaneous BID  . phytonadione  20 mg Oral TID   PRN Meds hydrALAZINE, morphine injection, ondansetron **OR** ondansetron (ZOFRAN) IV Results for orders placed or performed during the hospital encounter of 10/24/16 (from the past 48 hour(s))  Glucose, capillary     Status: None   Collection Time: 10/27/16  7:24 AM  Result Value Ref Range   Glucose-Capillary 96 65 - 99 mg/dL  Glucose, capillary     Status: Abnormal   Collection Time: 10/27/16 11:28 AM  Result Value Ref Range   Glucose-Capillary 125 (H) 65 - 99 mg/dL  Glucose, capillary     Status: Abnormal   Collection Time: 10/27/16  4:42 PM  Result Value Ref Range   Glucose-Capillary 103 (H) 65 - 99 mg/dL  Protime-INR     Status: Abnormal   Collection Time: 10/27/16  4:48 PM  Result  Value Ref Range   Prothrombin Time 21.3 (H) 11.4 - 15.2 seconds   INR 1.82   APTT     Status: Abnormal   Collection Time: 10/27/16  4:48 PM  Result Value Ref Range   aPTT 49 (H) 24 - 36 seconds    Comment:        IF BASELINE aPTT IS ELEVATED, SUGGEST PATIENT RISK ASSESSMENT BE USED TO DETERMINE APPROPRIATE ANTICOAGULANT THERAPY.   Glucose, capillary     Status: Abnormal   Collection Time: 10/27/16  9:42 PM  Result Value Ref Range   Glucose-Capillary 114 (H) 65 - 99 mg/dL  Occult blood card to lab, stool RN will collect     Status: Abnormal   Collection Time: 10/27/16  9:58 PM  Result Value Ref Range   Fecal Occult Bld  POSITIVE (A) NEGATIVE  Protime-INR     Status: Abnormal   Collection Time: 10/28/16  5:32 AM  Result Value Ref Range   Prothrombin Time 17.1 (H) 11.4 - 15.2 seconds   INR 1.38   APTT     Status: Abnormal   Collection Time: 10/28/16  5:32 AM  Result Value Ref Range   aPTT 40 (H) 24 - 36 seconds    Comment:        IF BASELINE aPTT IS ELEVATED, SUGGEST PATIENT RISK ASSESSMENT BE USED TO DETERMINE APPROPRIATE ANTICOAGULANT THERAPY.   CBC     Status: None   Collection Time: 10/28/16  5:32 AM  Result Value Ref Range   WBC 4.6 4.0 - 10.5 K/uL   RBC 5.03 4.22 - 5.81 MIL/uL   Hemoglobin 13.4 13.0 - 17.0 g/dL   HCT 19.1 47.8 - 29.5 %   MCV 81.5 78.0 - 100.0 fL   MCH 26.6 26.0 - 34.0 pg   MCHC 32.7 30.0 - 36.0 g/dL   RDW 62.1 30.8 - 65.7 %   Platelets 218 150 - 400 K/uL  Glucose, capillary     Status: None   Collection Time: 10/28/16  7:28 AM  Result Value Ref Range   Glucose-Capillary 85 65 - 99 mg/dL  Glucose, capillary     Status: Abnormal   Collection Time: 10/28/16 11:16 AM  Result Value Ref Range   Glucose-Capillary 123 (H) 65 - 99 mg/dL  Glucose, capillary     Status: Abnormal   Collection Time: 10/28/16  5:04 PM  Result Value Ref Range   Glucose-Capillary 166 (H) 65 - 99 mg/dL  Glucose, capillary     Status: None   Collection Time: 10/28/16  10:03 PM  Result Value Ref Range   Glucose-Capillary 92 65 - 99 mg/dL    No results found.            Blood pressure (!) 147/79, pulse 96, temperature 98.7 F (37.1 C), temperature source Oral, resp. rate 20, height  (1.753 m), weight 64.5 kg (142 lb 4.8 oz), SpO2 99 %.  Physical exam:   General--Pleasant African-American male no acute distress ENT-- sclera nonicteric. Patient has a surgical mask in place to avoid picking up any infections in the hospital. Neck-- no lymphadenopathy Heart-- regular rate and rhythm without murmurs are gallops Lungs-- clear Abdomen-- soft and nontender Psych-- alert and oriented and appropriate   Assessment: 1. Rectal bleeding. Probably due to coagulopathy but it 51 years of age he will need further evaluation at some point 2. Severe coagulopathy. This is almost certainly due to the rat poison and appears to be resolving. He has been seen by hematology  Plan: 1. I have discussed this with the patient and told him that he will need a colonoscopy since it is possible that he could have a lesion in the colon with the bleeding exacerbated by the severe coagulopathy. I told him I did not feel comfortable doing any type of procedure electively until we were absolutely certain that is clotting studies and return to normal. In view of this, I have given him my card and have suggested that he contact us and come to the office in about 2 weeks. We will repeat his clotting studies and if things are going well we will arrange an elective colonoscopy.    Shahab Polhamus JR,Satoria Dunlop L 10/29/2016, 7:14 AM   This note was created using voice recognition software and minor errors may Have occurred unintentionally. Pager: [phone]  If no answer or after hours call [phone]

## 2016-10-30 ENCOUNTER — Other Ambulatory Visit: Payer: Self-pay | Admitting: *Deleted

## 2016-10-30 ENCOUNTER — Telehealth: Payer: Self-pay | Admitting: Hematology

## 2016-10-30 DIAGNOSIS — D689 Coagulation defect, unspecified: Secondary | ICD-10-CM

## 2016-10-30 LAB — CBC
HCT: 41.4 % (ref 39.0–52.0)
Hemoglobin: 13.6 g/dL (ref 13.0–17.0)
MCH: 26.9 pg (ref 26.0–34.0)
MCHC: 32.9 g/dL (ref 30.0–36.0)
MCV: 82 fL (ref 78.0–100.0)
PLATELETS: 254 10*3/uL (ref 150–400)
RBC: 5.05 MIL/uL (ref 4.22–5.81)
RDW: 14 % (ref 11.5–15.5)
WBC: 4.2 10*3/uL (ref 4.0–10.5)

## 2016-10-30 LAB — BASIC METABOLIC PANEL
ANION GAP: 8 (ref 5–15)
BUN: 27 mg/dL — ABNORMAL HIGH (ref 6–20)
CALCIUM: 9.7 mg/dL (ref 8.9–10.3)
CHLORIDE: 98 mmol/L — AB (ref 101–111)
CO2: 29 mmol/L (ref 22–32)
Creatinine, Ser: 0.85 mg/dL (ref 0.61–1.24)
GFR calc non Af Amer: >60 mL/min (ref >60)
Glucose, Bld: 113 mg/dL — ABNORMAL HIGH (ref 65–99)
Potassium: 4.6 mmol/L (ref 3.5–5.1)
SODIUM: 135 mmol/L (ref 135–145)

## 2016-10-30 LAB — FACTOR 8 ASSAY: COAGULATION FACTOR VIII: 84 % (ref 57–163)

## 2016-10-30 LAB — FACTOR 2 ASSAY: FACTOR II ACTIVITY: 12 % — AB (ref 50–154)

## 2016-10-30 LAB — FACTOR 7 ASSAY: Factor VII Activity: 4 % — ABNORMAL LOW (ref 51–186)

## 2016-10-30 LAB — LUPUS ANTICOAGULANT PANEL
DRVVT: 113.2 s — ABNORMAL HIGH (ref 0.0–47.0)
PTT LA: 106.5 s — AB (ref 0.0–51.9)

## 2016-10-30 LAB — GLUCOSE, CAPILLARY
GLUCOSE-CAPILLARY: 109 mg/dL — AB (ref 65–99)
Glucose-Capillary: 136 mg/dL — ABNORMAL HIGH (ref 65–99)

## 2016-10-30 LAB — PTT-LA MIX: PTT-LA MIX: 55.4 s — AB (ref 0.0–48.9)

## 2016-10-30 LAB — FACTOR 9 ASSAY: Coagulation Factor IX: 4 % — ABNORMAL LOW (ref 60–177)

## 2016-10-30 LAB — APTT: APTT: 35 s (ref 24–36)

## 2016-10-30 LAB — HEXAGONAL PHASE PHOSPHOLIPID: HEXAGONAL PHASE PHOSPHOLIPID: 5 s (ref 0–11)

## 2016-10-30 LAB — FACTOR 5 ASSAY: Factor V Activity: 111 % (ref 70–150)

## 2016-10-30 LAB — DRVVT MIX: DRVVT MIX: 41 s (ref 0.0–47.0)

## 2016-10-30 LAB — FACTOR 10 ASSAY: Factor X Activity: 9 % — ABNORMAL LOW (ref 76–183)

## 2016-10-30 LAB — PROTIME-INR
INR: 1.1
PROTHROMBIN TIME: 14.2 s (ref 11.4–15.2)

## 2016-10-30 LAB — FACTOR 11 ASSAY: FACTOR XI ACTIVITY: 79 % (ref 60–150)

## 2016-10-30 MED ORDER — AMLODIPINE BESYLATE 10 MG PO TABS: 10.0000 mg | ORAL_TABLET | Freq: Every day | ORAL | Status: DC

## 2016-10-30 MED ORDER — PHYTONADIONE 5 MG PO TABS: 20.0000 mg | ORAL_TABLET | Freq: Three times a day (TID) | ORAL | 0 refills | Status: DC

## 2016-10-30 MED ORDER — AMLODIPINE BESYLATE 10 MG PO TABS: 10.0000 mg | ORAL_TABLET | Freq: Every day | ORAL | 1 refills | Status: DC

## 2016-10-30 MED FILL — AMLODIPINE BESYLATE 10 MG T: 10 | 30 days supply | Qty: 30 | Fill #0

## 2016-10-30 MED FILL — MEPHYTON 5 MG TABLET: 5 | 21 days supply | Qty: 252 | Fill #0

## 2016-10-30 MED FILL — SUBOXONE 4 MG-1 MG SL FILM: 4-1 | 30 days supply | Qty: 60 | Fill #0

## 2016-10-30 NOTE — Progress Notes (Signed)
Marland Kitchen   HEMATOLOGY/ONCOLOGY INPATIENT PROGRESS NOTE  Date of Service: 10/29/2016  Inpatient Attending: .Catarina Hartshorn, MD   SUBJECTIVE  Patient was seen evening on 10/29/2016. He notes that no further issues with bleeding. Had a bowel movement with no bleeding. Gross hematuria has resolved.  Vit K has been transitioned to PO today. This am coags WNL with PTT at 36 and PT 14.9 with INR 1.17. No other acute new symptoms.   OBJECTIVE:  NAD  PHYSICAL EXAMINATION: . Vitals:   10/29/16 0521 10/29/16 1353 10/29/16 2114 10/30/16 0555  BP: (!) 147/79 (!) 139/93 (!) 154/82   Pulse: 96  71   Resp: Temp: 98.7 F (37.1 C) 98.8 F (37.1 C) 98.4 F (36.9 C)   TempSrc: Oral Oral Oral   SpO2: 99% 100% 100%   Weight:      Height:       Filed Weights   10/25/16 0235  Weight: 142 lb 4.8 oz (64.5 kg)   .Body mass index is 21.01 kg/m.  GENERAL:alert, in no acute distress and comfortable SKIN: skin color, texture, turgor are normal, no rashes or significant lesions EYES: normal, conjunctiva are pink and non-injected, sclera clear OROPHARYNX:no exudate, no erythema and lips, buccal mucosa, and tongue normal  NECK: supple, no JVD, thyroid normal size, non-tender, without nodularity LYMPH:  no palpable lymphadenopathy in the cervical, axillary or inguinal LUNGS: clear to auscultation with normal respiratory effort HEART: regular rate & rhythm,  no murmurs and no lower extremity edema ABDOMEN: abdomen soft, non-tender, normoactive bowel sounds , resolving hematoma left flank Musculoskeletal: no cyanosis of digits and no clubbing  PSYCH: alert & oriented x 3 with fluent speech NEURO: no focal motor/sensory deficits  MEDICAL HISTORY:  Past Medical History:  Diagnosis Date  . Complication of anesthesia   . COPD (chronic obstructive pulmonary disease) (HCC)    chronic Bronchhititis  . Diabetes mellitus   . Hypertension   . 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  .  Pneumonia   . Seizures (HCC)    last one 3 years ago.  Md discontinued meds- Dr Anne Hahn    SURGICAL HISTORY: Past Surgical History:  Procedure Laterality Date  . ANTERIOR CERVICAL DECOMP/DISCECTOMY FUSION  12/20/2011   Procedure: ANTERIOR CERVICAL DECOMPRESSION/DISCECTOMY FUSION 2 LEVELS;  Surgeon: Mariam Dollar, MD;  Location: [facility];  Service: Neurosurgery;  Laterality: N/A;  Cervical four-five, five-six anterior cervical decompression/discectomy fusion with plating  . BACK SURGERY     5 from degenerative disease..    . HIP SURGERY     rods to each hip- hip can ot of socket.  . TONSILLECTOMY    . WRIST SURGERY     repair due to crush    SOCIAL HISTORY: Social History   Social History  . Marital status: Single    Spouse name: N/A  . Number of children: N/A  . Years of education: N/A   Occupational History  . Not on file.   Social History Main Topics  . Smoking status: Former Smoker    Packs/day: 0.50    Quit date: 09/22/2016  . Smokeless tobacco: Never Used  . Alcohol use No     Comment: occassional   . Drug use: No  . Sexual activity: Not on file   Other Topics Concern  . Not on file   Social History Narrative  . No narrative on file    FAMILY HISTORY: Family History  Problem  Relation Age of Onset  . Diabetes Mellitus II Mother     ALLERGIES:  is allergic to darvocet [propoxyphene n-acetaminophen] and onion.  MEDICATIONS:  Scheduled Meds: . amLODipine  5 mg Oral Daily  . buprenorphine-naloxone  2 tablet Sublingual BID  . chlorhexidine  15 mL Mouth Rinse BID  . feeding supplement (ENSURE ENLIVE)  237 mL Oral BID BM  . insulin aspart  0-9 Units Subcutaneous TID WC  . mouth rinse  15 mL Mouth Rinse q12n4p  . multivitamin with minerals  1 tablet Oral Daily  . pantoprazole  40 mg Oral BID  . phytonadione  20 mg Oral TID   Continuous Infusions: PRN Meds:.hydrALAZINE, ondansetron **OR** ondansetron (ZOFRAN) IV, oxyCODONE  REVIEW OF SYSTEMS:    10  Point review of Systems was done is negative except as noted above.   LABORATORY DATA:  I have reviewed the data as listed  . CBC Latest Ref Rng & Units 10/29/2016 10/28/2016 10/26/2016  WBC 4.0 - 10.5 K/uL 4.5 4.6 4.6  Hemoglobin 13.0 - 17.0 g/dL 16.1 09.6 04.5  Hematocrit 39.0 - 52.0 % 42.5 41.0 43.1  Platelets 150 - 400 K/uL 228 218 218    . CMP Latest Ref Rng & Units 10/25/2016 10/24/2016 10/24/2016  Glucose 65 - 99 mg/dL 94 409(W) 98  BUN 6 - 20 mg/dL Creatinine 0.61 - 1.24 mg/dL 1.19 1.47 8.29  Sodium 135 - 145 mmol/L 136 136 136  Potassium 3.5 - 5.1 mmol/L 3.6 3.7 3.6  Chloride 101 - 111 mmol/L 102 101 104  CO2 22 - 32 mmol/L Calcium 8.9 - 10.3 mg/dL 9.0 9.1 8.9  Total Protein 6.5 - 8.1 g/dL - 8.7(H) 8.0  Total Bilirubin 0.3 - 1.2 mg/dL - 0.5 0.4  Alkaline Phos 38 - 126 U/L - 58 54  AST 15 - 41 U/L - 19 18  ALT 17 - 63 U/L - 12(L) 11(L)       RADIOGRAPHIC STUDIES: I have personally reviewed the radiological images as listed and agreed with the findings in the report. Ct Abdomen Pelvis Wo Contrast  Result Date: 10/27/2016 CLINICAL DATA:  Hypertension, persistent pain do the abdomen. Nausea vomiting and rectal bleeding EXAM: CT ABDOMEN AND PELVIS WITHOUT CONTRAST TECHNIQUE: Multidetector CT imaging of the abdomen and pelvis was performed following the standard protocol without IV contrast. COMPARISON:  CT 10/23/2016 FINDINGS: Lower chest: Lung bases are clear. Hepatobiliary: No focal hepatic lesions noncontrast exam. Normal gallbladder Pancreas: Pancreas is normal. No ductal dilatation. No pancreatic inflammation. Spleen: Normal spleen Adrenals/urinary tract: Bilateral adrenal adenomas no nephrolithiasis or ureterolithiasis. No bladder calculi Stomach/Bowel: Stomach, small-bowel and cecum are normal. The appendix is not identified but there is no pericecal inflammation to suggest appendicitis. The colon and rectosigmoid colon are normal. Vascular/Lymphatic:  Abdominal aorta is normal caliber. There is no retroperitoneal or periportal lymphadenopathy. No pelvic lymphadenopathy. Reproductive: Uterus normal. Other: No free fluid. Musculoskeletal: No aggressive osseous lesion. Posterior lumbar fusion and decompression. Significant streak artifact generated from the metallic hardware. 2.8 x 1.6 cm hematoma superficial to the LEFT iliac wing (image 51, series 2). IMPRESSION: 1. Subcutaneous hematoma LEFT flank adjacent to the iliac wing *No bowel abnormality identified on this noncontrast exam.  I *Atherosclerotic calcification of the aorta. Electronically Signed   By: Genevive Bi M.D.   On: 10/27/2016 09:13   Ct Abdomen Pelvis W Contrast  Result Date: 10/23/2016 CLINICAL DATA:  Nausea and abdominal pain EXAM: CT ABDOMEN  AND PELVIS WITH CONTRAST TECHNIQUE: Multidetector CT imaging of the abdomen and pelvis was performed using the standard protocol following bolus administration of intravenous contrast. CONTRAST:  100 mL ISOVUE-300 IOPAMIDOL (ISOVUE-300) INJECTION 61% COMPARISON:  Ultrasound from earlier in the same day, 07/01/2016 FINDINGS: Lower chest: Minimal dependent atelectatic changes are noted. Hepatobiliary: The liver is diffusely decreased in attenuation consistent fatty infiltration. The gallbladder is well distended without focal abnormality. Pancreas: Unremarkable. No pancreatic ductal dilatation or surrounding inflammatory changes. Spleen: Spleen is within normal limits. Adrenals/Urinary Tract: Adrenal glands are unremarkable. Kidneys are normal, without renal calculi, focal lesion, or hydronephrosis. Bladder is decompressed. Stomach/Bowel: Scattered diverticular changes noted. No findings to suggest acute no diverticulitis is seen. Vascular/Lymphatic: Aortic atherosclerosis. No enlarged abdominal or pelvic lymph nodes. Reproductive: Prostate is unremarkable. Other: No abdominal wall hernia or abnormality. No abdominopelvic ascites. Musculoskeletal:  Postsurgical changes are noted in the lumbar spine and bilateral proximal femurs. No acute abnormality is seen. IMPRESSION: Chronic changes as described above stable from the previous exam. No acute abnormality noted. Electronically Signed   By: Alcide Clever M.D.   On: 10/23/2016 11:14   US Abdomen Limited Ruq  Result Date: 10/23/2016 CLINICAL DATA:  Abdominal pain with nausea and vomiting pain in pain History of systemic lupus erythematosus EXAM: US ABDOMEN LIMITED - RIGHT UPPER QUADRANT COMPARISON:  CT abdomen and pelvis July 01, 2016 FINDINGS: Gallbladder: No gallstones or wall thickening visualized. There is no pericholecystic fluid. No sonographic Murphy sign noted by sonographer. Common bile duct: Diameter: 3 mm. No intrahepatic or extrahepatic biliary duct dilatation. Liver: No focal lesion identified. Within normal limits in parenchymal echogenicity. Right kidney is noted to be somewhat echogenic. IMPRESSION: Somewhat echogenic right kidney. This is a finding that may be seen with medical renal disease. Appropriate laboratory correlation advised. Study otherwise unremarkable. Electronically Signed   By: Bretta Bang III M.D.   On: 10/23/2016 08:31    ASSESSMENT & PLAN:   51 year old African-American male with    #1 Severe coagulopathy with upper and lower GI bleeding and hematuria.  Patient had significantly elevated PT>90, PTT 419 with nl fibrinogen level of 419 and neg D dimer. Patient received FFP and vitamin K 10 mg IV with improvement of PT to 30.  His history of likely exposure to D con rat poison (Brodifacoum) along with elevation of PT and PTT and evidence of his most vitamin K is consistent with super warfarin toxicity unless proven otherwise.  Patient reports no exposure to Coumadin and no evidence of using somebody else's medications or other anticoagulants.  PT and PTT normalized today  PT and PTT mixing studies do not suggest obvious factor inhibitors being  present. #2 Rectal bleeing- resolved at this time #3 Hematuria - still with some pinkish urine but no overt blood clots #4 Likely Brodifacoum exposure. #5 Left flank induration - subcutaneous hematoma.  Plan -was on  PO Vit K to /d (  5 times daily) + [place] VitK total /d (  [place] 4 times daily) now has been transitioned to PO Vit K  po TID . -rpt coags in the AM. If uptrending on this dose might need to increase to  po QID. --he appears to need between 60-90mg  of Vit K daily based on response...this requirement will progressively decrease with time. --may discharge if coags stable and no clinical bleeding for 24-48 hours on PO vit K. -No intramuscular injections -If the patient has acute severe bleedingwould recommend using prothrombin complexconcentrate (Kcentra)25-50 international units per kilogram  to a maximum dose of 2000 international units. -Fall precautions and avoiding activities that could cause trauma due to high risk of bleeding. -It will be important for the social worker to confirm the cost of oral vitamin K based on the patient's insurance since this can be quite costly and difficult to cover and is an important element in his discharge planning. -GI prophylaxis with PPIs twice a day . -Avoid NSAIDs . -I have sent a scheduling message to our new patient schedulers to help setup hematology clinic followup in 7-10 days with labs. -he needs to make sure he has a PCP setup and connects with them as well.  Kindly call if any additional questions arise.  I spent 25 minutes counseling the patient face to face. The total time spent in the appointment was 35 minutes and more than 50% was on counseling and direct patient cares.    Wyvonnia Lora MD MS AAHIVMS [facility] Hematology/Oncology Physician [facility]  (Office):       [phone] (Work cell):  [phone] (Fax):           [phone]

## 2016-10-30 NOTE — Telephone Encounter (Signed)
I spoke to someone at the pt's home who says he's still in the hospital. She will have the pt call back to schedule once he's out of the hospital.

## 2016-10-30 NOTE — Progress Notes (Signed)
Pt have Medicare and Medicaid for insurance coverage. Which will cover his medications.

## 2016-10-30 NOTE — Progress Notes (Signed)
Spoke with [name] at [facility]  There was concern regarding duration and dose of warfarin prescription. I explained to pharmacist that patient has been seen by Dr. Candise Che and this was the recommendation regarding dosing. I subsequently instructed [name] to disregard the initial prescription for vitamin K, #360. I resent new prescription via E-prescribe for vitamin K 20 mg po tid, #84 (1 week supply) I advised [name] to contact Dr. Candise Che directly if there additional concerns regarding dosing and duration of therapy.  I called Dr. Candise Che and left voicemail to update him and requested that pt follow up with him within one week.  DTat

## 2016-10-30 NOTE — Progress Notes (Signed)
Pt will use [facility] for medications.

## 2016-10-30 NOTE — Progress Notes (Signed)
Initial Nutrition Assessment  DOCUMENTATION CODES:   Non-severe (moderate) malnutrition in context of chronic illness  INTERVENTION:   Ensure Enlive po BID, each supplement provides 350 kcal and 20 grams of protein  MVI  NUTRITION DIAGNOSIS:   Malnutrition (moderate) related to chronic illness, COPD, nausea as evidenced by severe depletion of muscle mass, 8 percent weight loss in one month, moderate depletion of body fat.  GOAL:   Patient will meet greater than or equal to 90% of their needs  MONITOR:   PO intake, Supplement acceptance, Labs, Weight trends  ASSESSMENT:   51 y.o. male with history of diabetes mellitus type 2, hypertension, COPD and chronic pain presents with the ER with complaints of persistent pain in the abdomen with nausea vomiting and rectal bleeding.  Admitted for coagulopathy; suspect rodenticide poisoning.   Pt continues to do well. Eating 100% meals and drinking Ensure. No new weight since admit. Pt transitioned to oral vitamin K.   Medications reviewed and include: suboxone, MVI, insulin, protonix, oxycodone, Vit K  Labs reviewed: Cl 98(L), BUN 27(H)  Diet Order:  Diet Heart Room service appropriate? Yes; Fluid consistency: Thin  Skin:  Reviewed, no issues  Last BM:  4/29  Height:   Ht Readings from Last 1 Encounters:  10/25/16  (1.753 m)    Weight:   Wt Readings from Last 1 Encounters:  10/25/16 142 lb 4.8 oz (64.5 kg)    Ideal Body Weight:  72.7 kg  BMI:  Body mass index is 21.01 kg/m.  Estimated Nutritional Needs:   Kcal:  1900-2200kcal/day   Protein:  97-110g/day   Fluid:  >1.9L/day   EDUCATION NEEDS:   No education needs identified at this time  Betsey Holiday, RD, LDN Pager #[phone]

## 2016-10-31 LAB — MISC LABCORP TEST (SEND OUT)

## 2016-11-01 ENCOUNTER — Telehealth: Payer: Self-pay | Admitting: Hematology

## 2016-11-01 ENCOUNTER — Encounter: Payer: Self-pay | Admitting: Hematology

## 2016-11-01 LAB — MULTIPLE MYELOMA PANEL, SERUM
ALBUMIN/GLOB SERPL: 1 (ref 0.7–1.7)
ALPHA2 GLOB SERPL ELPH-MCNC: 0.8 g/dL (ref 0.4–1.0)
Albumin SerPl Elph-Mcnc: 3.5 g/dL (ref 2.9–4.4)
Alpha 1: 0.3 g/dL (ref 0.0–0.4)
B-Globulin SerPl Elph-Mcnc: 0.9 g/dL (ref 0.7–1.3)
GLOBULIN, TOTAL: 3.6 g/dL (ref 2.2–3.9)
Gamma Glob SerPl Elph-Mcnc: 1.6 g/dL (ref 0.4–1.8)
IGA: 282 mg/dL (ref 90–386)
IGM, SERUM: 116 mg/dL (ref 20–172)
IgG (Immunoglobin G), Serum: 1809 mg/dL — ABNORMAL HIGH (ref 700–1600)
TOTAL PROTEIN ELP: 7.1 g/dL (ref 6.0–8.5)

## 2016-11-01 NOTE — Telephone Encounter (Signed)
error 

## 2016-11-09 ENCOUNTER — Inpatient Hospital Stay: Payer: Medicare Other | Admitting: Hematology

## 2016-11-10 ENCOUNTER — Telehealth: Payer: Self-pay | Admitting: Hematology

## 2016-11-10 NOTE — Telephone Encounter (Signed)
lvm to inform pt of r/s hospital f/u appt 5/14 at 220 pm per sch msg

## 2016-11-13 ENCOUNTER — Inpatient Hospital Stay: Payer: Medicare Other | Admitting: Hematology

## 2016-11-20 ENCOUNTER — Other Ambulatory Visit: Payer: Self-pay | Admitting: Gastroenterology

## 2016-11-22 MED FILL — SUBOXONE 4 MG-1 MG SL FILM: 4-1 | 30 days supply | Qty: 60 | Fill #0

## 2016-12-01 DIAGNOSIS — D649 Anemia, unspecified: Secondary | ICD-10-CM

## 2016-12-01 HISTORY — DX: Anemia, unspecified: D64.9

## 2016-12-05 ENCOUNTER — Ambulatory Visit (HOSPITAL_BASED_OUTPATIENT_CLINIC_OR_DEPARTMENT_OTHER): Payer: Medicare Other

## 2016-12-05 ENCOUNTER — Encounter: Payer: Self-pay | Admitting: Hematology

## 2016-12-05 ENCOUNTER — Ambulatory Visit (HOSPITAL_BASED_OUTPATIENT_CLINIC_OR_DEPARTMENT_OTHER): Payer: Medicare Other | Admitting: Hematology

## 2016-12-05 VITALS — BP 129/76 | HR 73 | Temp 98.9°F | Resp 20 | Ht 69.0 in | Wt 161.0 lb

## 2016-12-05 DIAGNOSIS — T45511S Poisoning by anticoagulants, accidental (unintentional), sequela: Secondary | ICD-10-CM

## 2016-12-05 DIAGNOSIS — D689 Coagulation defect, unspecified: Secondary | ICD-10-CM

## 2016-12-05 DIAGNOSIS — R319 Hematuria, unspecified: Secondary | ICD-10-CM

## 2016-12-05 DIAGNOSIS — T45511A Poisoning by anticoagulants, accidental (unintentional), initial encounter: Secondary | ICD-10-CM

## 2016-12-05 LAB — CBC & DIFF AND RETIC
BASO%: 0.4 % (ref 0.0–2.0)
BASOS ABS: 0 10*3/uL (ref 0.0–0.1)
EOS%: 1.2 % (ref 0.0–7.0)
Eosinophils Absolute: 0.1 10*3/uL (ref 0.0–0.5)
HEMATOCRIT: 38.8 % (ref 38.4–49.9)
HEMOGLOBIN: 12.7 g/dL — AB (ref 13.0–17.1)
IMMATURE RETIC FRACT: 5.2 % (ref 3.00–10.60)
LYMPH%: 45.2 % (ref 14.0–49.0)
MCH: 27.3 pg (ref 27.2–33.4)
MCHC: 32.7 g/dL (ref 32.0–36.0)
MCV: 83.3 fL (ref 79.3–98.0)
MONO#: 0.5 10*3/uL (ref 0.1–0.9)
MONO%: 9.8 % (ref 0.0–14.0)
NEUT#: 2.2 10*3/uL (ref 1.5–6.5)
NEUT%: 43.4 % (ref 39.0–75.0)
Platelets: 210 10*3/uL (ref 140–400)
RBC: 4.66 10*6/uL (ref 4.20–5.82)
RDW: 15.5 % — ABNORMAL HIGH (ref 11.0–14.6)
RETIC CT ABS: 53.12 10*3/uL (ref 34.80–93.90)
Retic %: 1.14 % (ref 0.80–1.80)
WBC: 5.1 10*3/uL (ref 4.0–10.3)
lymph#: 2.3 10*3/uL (ref 0.9–3.3)

## 2016-12-05 LAB — COMPREHENSIVE METABOLIC PANEL
ALBUMIN: 4 g/dL (ref 3.5–5.0)
ALK PHOS: 54 U/L (ref 40–150)
ALT: 10 U/L (ref 0–55)
ANION GAP: 8 meq/L (ref 3–11)
AST: 14 U/L (ref 5–34)
BILIRUBIN TOTAL: 0.32 mg/dL (ref 0.20–1.20)
BUN: 15.9 mg/dL (ref 7.0–26.0)
CO2: 30 mEq/L — ABNORMAL HIGH (ref 22–29)
CREATININE: 0.9 mg/dL (ref 0.7–1.3)
Calcium: 9.4 mg/dL (ref 8.4–10.4)
Chloride: 102 mEq/L (ref 98–109)
Glucose: 66 mg/dl — ABNORMAL LOW (ref 70–140)
Potassium: 4.3 mEq/L (ref 3.5–5.1)
Sodium: 140 mEq/L (ref 136–145)
TOTAL PROTEIN: 7.7 g/dL (ref 6.4–8.3)

## 2016-12-05 LAB — TECHNOLOGIST REVIEW

## 2016-12-05 LAB — PROTIME-INR
INR: 1.1 — AB (ref 2.00–3.50)
Protime: 13.2 Seconds (ref 10.6–13.4)

## 2016-12-06 LAB — APTT: APTT: 31 s (ref 24–33)

## 2016-12-20 MED FILL — SUBOXONE 4 MG-1 MG SL FILM: 4-1 | 30 days supply | Qty: 60 | Fill #0

## 2017-01-03 NOTE — Progress Notes (Signed)
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  Current Treatment: High dose vit K  INTERVAL HISTORY:  Patient was seen in the hospital previously hematology consultation when he presented with GI bleeding and hematuria and after extensive workup was noted to have superwarfarin toxicity from accidental exposure to rat poison (D-Con). Testing eventually revealed he was positive for Difenacoum , Brodifacoum, Bromadiolone . He was treated with high doses of Vit K IV and PO till his coagulopathy and bleeding was controlled and then was discharged on Vit K 20mg  po TID. He was to see [place] in clinic in 1 week to reassess his coagulopathy. He no showed in clinic x 2 . He comes to clinic for f/u today. Notes e did take the vit K at the discharge dose for 3 weeks and had no issues with bleeding.    REVIEW OF SYSTEMS:    10 Point review of systems of done and is negative except as noted above.  . Past Medical History:  Diagnosis Date  . Complication of anesthesia   . COPD (chronic obstructive pulmonary disease) (HCC)    chronic Bronchhititis  . Diabetes mellitus   . Hypertension   . 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  . Pneumonia   . Seizures (HCC)    last one 3 years ago.  Md discontinued meds- Dr Anne Hahn    . Past Surgical History:  Procedure Laterality Date  . ANTERIOR CERVICAL DECOMP/DISCECTOMY FUSION  12/20/2011   Procedure: ANTERIOR CERVICAL DECOMPRESSION/DISCECTOMY FUSION 2 LEVELS;  Surgeon: Mariam Dollar, MD;  Location: [facility];  Service: Neurosurgery;  Laterality: N/A;  Cervical four-five, five-six anterior cervical decompression/discectomy fusion with plating  . BACK SURGERY     5 from degenerative disease..    . HIP SURGERY     rods to each hip- hip can ot of socket.  .  TONSILLECTOMY    . WRIST SURGERY     repair due to crush    . Social History  Substance Use Topics  . Smoking status: Former Smoker    Packs/day: 0.50    Quit date: 09/22/2016  . Smokeless tobacco: Never Used  . Alcohol use No     Comment: occassional     ALLERGIES:  is allergic to darvocet [propoxyphene n-acetaminophen] and onion.  MEDICATIONS:  Current Outpatient Prescriptions  Medication Sig Dispense Refill  . albuterol (PROVENTIL HFA;VENTOLIN HFA) 108 (90 Base) MCG/ACT inhaler Inhale 2 puffs into the lungs every 6 (six) hours as needed for wheezing or shortness of breath.    [name] amLODipine (NORVASC) 10 MG tablet Take 1 tablet (10 mg total) by mouth daily. 30 tablet 1  . atenolol (TENORMIN) 25 MG tablet     . Fluticasone-Salmeterol (ADVAIR) 250-50 MCG/DOSE AEPB Inhale 1 puff into the lungs 2 (two) times daily.    [name] glimepiride (AMARYL) 2 MG tablet     . hydrochlorothiazide (HYDRODIURIL) 25 MG tablet     . metFORMIN (GLUCOPHAGE) 850 MG tablet     . SUBOXONE 4-1 MG FILM   0  . phytonadione (VITAMIN K) 5 MG tablet Take 4 tablets (20 mg total) by mouth 3 (three) times daily. (Patient not taking: Reported on 12/05/2016) 84 tablet 0   No current facility-administered medications for this visit.     PHYSICAL EXAMINATION: ECOG  PERFORMANCE STATUS: 0 - Asymptomatic  . Vitals:   12/05/16 1023  BP: 129/76  Pulse: 73  Resp: 20  Temp: 98.9 F (37.2 C)    Filed Weights   12/05/16 1023  Weight: 161 lb (73 kg)   .Body mass index is 23.78 kg/m.  GENERAL:alert, in no acute distress and comfortable SKIN: no acute rashes, no significant lesions EYES: conjunctiva are pink and non-injected, sclera anicteric OROPHARYNX: MMM, no exudates, no oropharyngeal erythema or ulceration NECK: supple, no JVD LYMPH:  no palpable lymphadenopathy in the cervical, axillary or inguinal regions LUNGS: clear to auscultation b/l with normal respiratory effort HEART: regular rate & rhythm ABDOMEN:   normoactive bowel sounds , non tender, not distended. Extremity: no pedal edema PSYCH: alert & oriented x 3 with fluent speech NEURO: no focal motor/sensory deficits  LABORATORY DATA:   I have reviewed the data as listed  . CBC Latest Ref Rng & Units 12/05/2016 10/30/2016 10/29/2016  WBC 4.0 - 10.3 10e3/uL 5.1 4.2 4.5  Hemoglobin 13.0 - 17.1 g/dL 12.7(L) 13.6 14.1  Hematocrit 38.4 - 49.9 % 38.8 41.4 42.5  Platelets 140 - 400 10e3/uL 210 254 228    . CMP Latest Ref Rng & Units 12/05/2016 10/30/2016 10/25/2016  Glucose 70 - 140 mg/dl 16(X) 096(E) 94  BUN 7.0 - 26.0 mg/dL 45.4 09(W) 13  Creatinine 0.7 - 1.3 mg/dL 0.9 1.19 1.47  Sodium 829 - 145 mEq/L 140 135 136  Potassium 3.5 - 5.1 mEq/L 4.3 4.6 3.6  Chloride 101 - 111 mmol/L - 98(L) 102  CO2 22 - 29 mEq/L 30(H) 29 24  Calcium 8.4 - 10.4 mg/dL 9.4 9.7 9.0  Total Protein 6.4 - 8.3 g/dL 7.7 - -  Total Bilirubin 0.20 - 1.20 mg/dL 5.62 - -  Alkaline Phos 40 - 150 U/L 54 - -  AST 5 - 34 U/L 14 - -  ALT 0 - 55 U/L 10 - -   Component     Latest Ref Rng & Units 12/05/2016  Protime     10.6 - 13.4 Seconds 13.2  INR     2.00 - 3.50 1.10 (L)  Lovenox      No  APTT     24 - 33 sec 31    [facility] test code LC TEST [id number]   [facility] test name ANTICOAGULANT POISONING PANEL   Misc [facility] result COMMENT   Comment: (NOTE)  Test Ordered: [id number] Anticoagulant Poisoning Pnl,QL  Warfarin            Note:      ng/mL  NM    None Detected                         Reporting Limit: 10 ng/mL  Synonym(s): Coumadin  Normal: None Detected  Analysis by High Performance Liquid Chromatography/  TandemMass Spectrometry (LC-MS/MS)  Dicumarol           Note:      ng/mL  NM    None Detected                         Reporting Limit: 10 ng/mL  Normal: None Detected  Analysis by High Performance Liquid Chromatography/  TandemMass Spectrometry (LC-MS/MS)  Diphacinone           Note:      ng/mL  NM    None Detected  Reporting Limit: 10 ng/mL  Synonym(s): Diphenadione  Normal: None Detected  Analysis by High Performance Liquid Chromatography/  TandemMass Spectrometry (LC-MS/MS)  Chlorophacinone        Note:      ng/mL  NM    None Detected                        Reporting Limit: 10 ng/mL  Normal: None Detected  Analysis by High Performance Liquid Chromatography/  TandemMass Spectrometry (LC-MS/MS)  Difenacoum           Positive     ng/mL  NM   Reporting Limit: 10 ng/mL  Synonym(s): Diphenacoum  Normal: None Detected  Analysis by High Performance Liquid Chromatography/  TandemMass Spectrometry (LC-MS/MS)  Brodifacoum          Positive     ng/mL  NM   Reporting Limit: 10 ng/mL  Synonym(s): Talon(R)  Normal: None Detected  Analysis by High Performance Liquid Chromatography/  TandemMass Spectrometry (LC-MS/MS)  Bromadiolone          Positive     ng/mL  NM   Reporting Limit: 10 ng/mL  Normal: None Detected  Analysis by High Performance Liquid Chromatography/  TandemMass Spectrometry (LC-MS/MS)  Performed At: [facility]  [address] [place], [place] [id number]    Difenacoum   Brodifacoum Bromadiolone   RADIOGRAPHIC STUDIES: I have personally reviewed the radiological images as listed and agreed with the findings in the report. No results found.  ASSESSMENT & PLAN:   51 year old African-American male with    #1 Severe coagulopathy with upper and lower GI bleeding and hematuria.  Patient had significantly elevated PT>90, PTT 419 with nl fibrinogen level of 419 and neg D dimer. Patient received FFP and vitamin K 10 mg IV with improvement of PT to 30.  His history of likely exposure to D con rat poison along with elevation of PT and PTT and evidence of response to vitamin  K is consistent with super warfarin toxicity . Testing eventually resulted in confirming he was positive for Difenacoum, Brodifacoum Bromadiolone exposure. #2 Rectal bleeing- resolved at this time #3 Hematuria - still with some pinkish urine but no overt blood clots #4 Likely Brodifacoum exposure. #5 Left flank induration - subcutaneous hematoma.  Plan -patient no showed in clinic 2 times despite being advised regarding the importance off/u. - he reports that he did take vit K 20mg  po TID for 3 weeks and had no bleeding. -Labs were done today and show stable hgb and normal PT/PTT off vit K for >1 week suggesting resolution of super-warfarin toxicity . -no indication for additional Vt K at this time. -continue F/u with PCP to consider outpatient GI w/u and urology w/u to r/o other etiology/underlying lesions that might have contributed to his hematuria and GI bleeding.  RTC with Dr Candise CheKale on an as needed basis only if any new questions arise     I spent 30 minutes counseling the patient face to face. The total time spent in the appointment was 40 minutes and more than 50% was on counseling and direct patient cares.    Wyvonnia LoraGautam Keyetta Hollingworth MD MS AAHIVMS [facility] Hematology/Oncology Physician [facility]  (Office):       [phone] (Work cell):  [phone] (Fax):           [phone]

## 2017-01-08 ENCOUNTER — Other Ambulatory Visit: Payer: Self-pay | Admitting: Gastroenterology

## 2017-01-10 ENCOUNTER — Ambulatory Visit (HOSPITAL_COMMUNITY): Admission: RE | Admit: 2017-01-10 | Payer: Medicare Other | Source: Ambulatory Visit | Admitting: Gastroenterology

## 2017-01-10 ENCOUNTER — Encounter (HOSPITAL_COMMUNITY): Admission: RE | Payer: Self-pay | Source: Ambulatory Visit

## 2017-01-10 SURGERY — COLONOSCOPY WITH PROPOFOL
Anesthesia: Monitor Anesthesia Care

## 2017-01-18 MED FILL — SUBOXONE 4 MG-1 MG SL FILM: 4-1 | 28 days supply | Qty: 70 | Fill #0

## 2017-01-18 MED FILL — MOVANTIK 25 MG TABLET: 25 | 30 days supply | Qty: 30 | Fill #0

## 2017-01-31 ENCOUNTER — Other Ambulatory Visit: Payer: Self-pay | Admitting: Gastroenterology

## 2017-02-16 MED FILL — SUBOXONE 4 MG-1 MG SL FILM: 4-1 | 30 days supply | Qty: 75 | Fill #0

## 2017-02-19 ENCOUNTER — Encounter (HOSPITAL_COMMUNITY): Payer: Self-pay | Admitting: *Deleted

## 2017-03-20 ENCOUNTER — Encounter (HOSPITAL_COMMUNITY): Payer: Self-pay | Admitting: *Deleted

## 2017-03-21 ENCOUNTER — Encounter (HOSPITAL_COMMUNITY): Payer: Self-pay | Admitting: Certified Registered Nurse Anesthetist

## 2017-03-21 MED FILL — SUBOXONE 4 MG-1 MG SL FILM: 4-1 | 30 days supply | Qty: 75 | Fill #0

## 2017-03-22 ENCOUNTER — Ambulatory Visit (HOSPITAL_COMMUNITY): Admission: RE | Admit: 2017-03-22 | Payer: Medicare Other | Source: Ambulatory Visit | Admitting: Gastroenterology

## 2017-03-22 HISTORY — DX: Gastro-esophageal reflux disease without esophagitis: K21.9

## 2017-03-22 HISTORY — DX: Disease of blood and blood-forming organs, unspecified: D75.9

## 2017-03-22 HISTORY — DX: Unspecified osteoarthritis, unspecified site: M19.90

## 2017-03-22 HISTORY — DX: Anemia, unspecified: D64.9

## 2017-03-22 SURGERY — COLONOSCOPY WITH PROPOFOL
Anesthesia: Monitor Anesthesia Care

## 2017-04-20 MED FILL — MOVANTIK 25 MG TABLET: 25 | 30 days supply | Qty: 30 | Fill #1 | Status: TO

## 2017-04-20 MED FILL — SUBOXONE 4 MG-1 MG SL FILM: 4-1 | 30 days supply | Qty: 75 | Fill #0

## 2017-05-18 MED FILL — SUBOXONE 4 MG-1 MG SL FILM: 4-1 | 30 days supply | Qty: 75 | Fill #0

## 2017-06-20 MED FILL — SUBOXONE 4 MG-1 MG SL FILM: 4-1 | 30 days supply | Qty: 75 | Fill #0

## 2017-07-19 MED FILL — SUBOXONE 4 MG-1 MG SL FILM: 4-1 | 3 days supply | Qty: 8 | Fill #0

## 2017-07-24 MED FILL — SUBOXONE 4 MG-1 MG SL FILM: 4-1 | 30 days supply | Qty: 75 | Fill #0

## 2017-08-20 MED FILL — SUBOXONE 4 MG-1 MG SL FILM: 4-1 | 30 days supply | Qty: 75 | Fill #0

## 2017-09-17 MED FILL — SUBOXONE 4 MG-1 MG SL FILM: 4-1 | 30 days supply | Qty: 75 | Fill #0

## 2017-10-17 MED FILL — SUBOXONE 4 MG-1 MG SL FILM: 4-1 | 30 days supply | Qty: 75 | Fill #0

## 2017-11-15 MED FILL — SUBOXONE 4 MG-1 MG SL FILM: 4-1 | 30 days supply | Qty: 75 | Fill #0

## 2017-12-14 MED FILL — SUBOXONE 4 MG-1 MG SL FILM: 4-1 | 30 days supply | Qty: 75 | Fill #0

## 2018-01-11 MED FILL — SUBOXONE 4 MG-1 MG SL FILM: 4-1 | 6 days supply | Qty: 15 | Fill #0

## 2018-01-11 MED FILL — MOVANTIK 25 MG TABLET: 25 | 30 days supply | Qty: 30 | Fill #0 | Status: TO

## 2018-01-17 MED FILL — SUBOXONE 4 MG-1 MG SL FILM: 4-1 | 30 days supply | Qty: 75 | Fill #0

## 2018-02-14 MED FILL — SUBOXONE 4 MG-1 MG SL FILM: 4-1 | 30 days supply | Qty: 75 | Fill #0

## 2018-03-14 MED FILL — SUBOXONE 4 MG-1 MG SL FILM: 4-1 | 37 days supply | Qty: 75 | Fill #0

## 2018-04-15 MED FILL — SUBOXONE 4 MG-1 MG SL FILM: 4-1 | 28 days supply | Qty: 84 | Fill #0

## 2018-05-13 MED FILL — SUBOXONE 4 MG-1 MG SL FILM: 4-1 | 30 days supply | Qty: 90 | Fill #0

## 2018-06-10 MED FILL — SUBOXONE 4 MG-1 MG SL FILM: 4-1 | 30 days supply | Qty: 90 | Fill #0

## 2018-06-10 MED FILL — NARCAN 4 MG NASAL SPRAY: 4 | 30 days supply | Qty: 2 | Fill #0

## 2018-07-10 MED FILL — SUBOXONE 4 MG-1 MG SL FILM: 4-1 | 30 days supply | Qty: 90 | Fill #0

## 2018-08-07 MED FILL — BUPRENORPHINE HCL-NALOXONE: 4-1 | 30 days supply | Qty: 90 | Fill #0

## 2018-09-04 MED FILL — BUPRENORPHINE HCL-NALOXONE: 4-1 | 30 days supply | Qty: 90 | Fill #0

## 2018-10-02 MED FILL — BUPRENORPHINE HCL-NALOXONE: 4-1 | 30 days supply | Qty: 90 | Fill #0

## 2018-10-30 MED FILL — BUPRENORPHINE HCL-NALOXONE: 4-1 | 30 days supply | Qty: 90 | Fill #0

## 2018-11-27 MED FILL — BUPRENORPHINE HCL-NALOXONE: 4-1 | 30 days supply | Qty: 90 | Fill #0

## 2018-12-26 MED FILL — BUPRENORPHINE HCL-NALOXONE: 4-1 | 30 days supply | Qty: 90 | Fill #0

## 2019-01-20 MED FILL — BUPRENORPHINE HCL-NALOXONE: 4-1 | 90 days supply | Qty: 90 | Fill #0

## 2019-02-17 MED FILL — BUPRENORPHINE HCL-NALOXONE: 4-1 | 30 days supply | Qty: 90 | Fill #0

## 2019-03-19 MED FILL — RELISTOR 150 MG TABLET: 150 | 30 days supply | Qty: 90 | Fill #0

## 2019-03-19 MED FILL — BUPRENORPHINE HCL-NALOXONE: 4-1 | 30 days supply | Qty: 90 | Fill #0

## 2019-04-17 MED FILL — BUPRENORPHINE HCL-NALOXONE: 4-1 | 30 days supply | Qty: 90 | Fill #0

## 2019-05-15 MED FILL — BUPRENORPHINE HCL-NALOXONE: 4-1 | 30 days supply | Qty: 90 | Fill #0

## 2019-06-12 MED FILL — BUPRENORPHINE HCL-NALOXONE: 4-1 | 30 days supply | Qty: 90 | Fill #0

## 2019-07-10 MED FILL — BUPRENORPHINE HCL-NALOXONE: 4-1 | 30 days supply | Qty: 90 | Fill #0

## 2019-08-07 MED FILL — BUPRENORPHINE HCL-NALOXONE: 4-1 | 30 days supply | Qty: 90 | Fill #0

## 2019-09-04 MED FILL — BUPRENORPHINE HCL-NALOXONE: 4-1 | 30 days supply | Qty: 90 | Fill #0

## 2019-09-15 MED FILL — BUPRENORPHINE HCL-NALOXONE: 4-1 | 30 days supply | Qty: 90 | Fill #0

## 2019-09-29 MED FILL — BUPRENORPHINE HCL-NALOXONE: 4-1 | 30 days supply | Qty: 90 | Fill #0

## 2019-10-28 MED FILL — BUPRENORPHINE HCL-NALOXONE: 4-1 | 30 days supply | Qty: 90 | Fill #0

## 2019-11-25 MED FILL — BUPRENORPHINE HCL-NALOXONE: 4-1 | 30 days supply | Qty: 90 | Fill #0

## 2020-09-06 ENCOUNTER — Telehealth: Payer: Self-pay

## 2020-09-06 NOTE — Telephone Encounter (Signed)
Did not contact this patient, entered in error.

## 2021-08-05 ENCOUNTER — Other Ambulatory Visit: Payer: Self-pay | Admitting: Family Medicine

## 2021-08-05 DIAGNOSIS — R7989 Other specified abnormal findings of blood chemistry: Secondary | ICD-10-CM

## 2021-08-22 ENCOUNTER — Other Ambulatory Visit: Payer: Medicare Other

## 2021-08-23 ENCOUNTER — Ambulatory Visit: Payer: Medicare Other | Admitting: Physician Assistant

## 2021-09-09 ENCOUNTER — Other Ambulatory Visit: Payer: Medicare Other

## 2021-09-21 ENCOUNTER — Ambulatory Visit: Payer: Medicare Other | Admitting: Physician Assistant

## 2021-10-07 ENCOUNTER — Other Ambulatory Visit: Payer: Medicare Other

## 2021-11-03 ENCOUNTER — Inpatient Hospital Stay: Admission: RE | Admit: 2021-11-03 | Payer: Medicare Other | Source: Ambulatory Visit

## 2021-11-07 ENCOUNTER — Other Ambulatory Visit: Payer: Medicare Other

## 2022-01-31 ENCOUNTER — Other Ambulatory Visit: Payer: Medicare Other

## 2022-01-31 ENCOUNTER — Inpatient Hospital Stay: Admission: RE | Admit: 2022-01-31 | Payer: Medicare Other | Source: Ambulatory Visit

## 2022-07-18 ENCOUNTER — Other Ambulatory Visit: Payer: Self-pay | Admitting: Family Medicine

## 2022-07-18 DIAGNOSIS — R7401 Elevation of levels of liver transaminase levels: Secondary | ICD-10-CM

## 2022-08-02 ENCOUNTER — Other Ambulatory Visit: Payer: Self-pay | Admitting: Internal Medicine

## 2022-08-02 ENCOUNTER — Other Ambulatory Visit: Payer: Self-pay

## 2022-08-02 DIAGNOSIS — R7401 Elevation of levels of liver transaminase levels: Secondary | ICD-10-CM

## 2022-09-13 ENCOUNTER — Other Ambulatory Visit: Payer: Medicare Other

## 2022-09-25 ENCOUNTER — Ambulatory Visit
Admission: RE | Admit: 2022-09-25 | Discharge: 2022-09-25 | Disposition: A | Discharge status: Home / Self Care | Payer: Medicare (Managed Care) | Source: Ambulatory Visit

## 2022-09-25 DIAGNOSIS — R7401 Elevation of levels of liver transaminase levels: Secondary | ICD-10-CM

## 2023-01-16 ENCOUNTER — Other Ambulatory Visit (INDEPENDENT_AMBULATORY_CARE_PROVIDER_SITE_OTHER): Payer: Medicare (Managed Care)

## 2023-01-16 ENCOUNTER — Ambulatory Visit (INDEPENDENT_AMBULATORY_CARE_PROVIDER_SITE_OTHER): Payer: Medicare (Managed Care) | Admitting: Orthopaedic Surgery

## 2023-01-16 ENCOUNTER — Encounter: Payer: Self-pay | Admitting: Orthopaedic Surgery

## 2023-01-16 DIAGNOSIS — G8929 Other chronic pain: Secondary | ICD-10-CM

## 2023-01-16 DIAGNOSIS — M1652 Unilateral post-traumatic osteoarthritis, left hip: Secondary | ICD-10-CM | POA: Diagnosis not present

## 2023-01-16 DIAGNOSIS — M545 Low back pain, unspecified: Secondary | ICD-10-CM

## 2023-01-16 NOTE — Progress Notes (Addendum)
Office Visit Note   Patient: [name]           Date of Birth: [birth year]           MRN: [id number] Visit Date: 01/16/2023              Requested by: Care, Jovita [facility] [address] [place],  [place] [address] PCP: [facility], [facility]   Assessment & Plan: Visit Diagnoses:  1. Post-traumatic osteoarthritis of left hip   2. 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  Treatment options were explained and given the bone-on-bone changes I have recommended total hip replacement is most reliable and durable means of pain relief.  He will think about his options and let me know.  We also talked about nonsurgical treatment options including physical therapy, medications, cortisone injections.  Follow-Up Instructions: No follow-ups on file.   Orders:  Orders Placed This Encounter  Procedures   XR HIP UNILAT W OR W/O PELVIS 2-3 VIEWS LEFT   XR Lumbar Spine 2-3 Views   No orders of the defined types were placed in this encounter.     Procedures: No procedures performed   Clinical Data: No additional findings.   Subjective: Chief Complaint  Patient presents with   Left Hip - Pain   Lower Back - Pain    HPI [name] is a 57 year old gentleman here for left hip and groin pain that is gotten worse in the last 6 months.  Also has history of back surgery by Dr. Wynetta Emery.  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  Currently on chronic pain medications and Suboxone.  [facility]erral from [facility]. Review of Systems  Constitutional: Negative.   HENT: Negative.    Eyes: Negative.   Respiratory: Negative.    Cardiovascular: Negative.   Gastrointestinal: Negative.   Endocrine: Negative.   Genitourinary: Negative.    Skin: Negative.   Allergic/Immunologic: Negative.   Neurological: Negative.   Hematological: Negative.   Psychiatric/Behavioral: Negative.    All other systems reviewed and are negative.    Objective: Vital Signs: There were no vitals taken for this visit.  Physical Exam Vitals and nursing note reviewed.  Constitutional:      Appearance: He is well-developed.  HENT:     Head: Normocephalic and atraumatic.  Eyes:     Pupils: Pupils are equal, round, and reactive to light.  Pulmonary:     Effort: Pulmonary effort is normal.  Abdominal:     Palpations: Abdomen is soft.  Musculoskeletal:        General: Normal range of motion.     Cervical back: Neck supple.  Skin:    General: Skin is warm.  Neurological:     Mental Status: He is alert and oriented to person, place, and time.  Psychiatric:        Behavior: Behavior normal.        Thought Content: Thought content normal.        Judgment: Judgment normal.     Ortho Exam Examination of the left hip shows antalgic gait.  Uses a cane.  Pain with movement of the hip joint.  No trochanteric tenderness. Specialty Comments:  No specialty comments available.  Imaging: XR HIP UNILAT W OR  W/O PELVIS 2-3 VIEWS LEFT  Result Date: 01/16/2023 Advanced left hip posttraumatic DJD with bone-on-bone joint space narrowing.  There is presence of Knowles pins in both hips.  XR Lumbar Spine 2-3 Views  Result Date: 01/16/2023 Status post lumbar spine surgery with fusion and instrumentation.  No abnormalities.    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    Hematemesis 10/24/2016   Past Medical History:  Diagnosis Date   Anemia 12/2016   in hospital for dcon rat poisioning for 6 days took vitamin k , accidental poisining   Blood dyscrasia    clot on lower spine after 6th back surgery could not move legs, clot removed then could move legs   Complication of anesthesia    after 6th back surgery clot on spine, could not move legs, had to have clot removed then could move legs   COPD (chronic obstructive pulmonary disease) (HCC)    chronic Bronchhititis   Diabetes mellitus    type II    DJD (degenerative joint disease)    lower and upper   GERD (gastroesophageal reflux disease)    occ, no meds for   Hypertension    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   Pneumonia yrs ago   hx of once    Seizures (HCC)    last seizure 5 years ago no cause found for seizures    Family History  Problem Relation Age of Onset   Diabetes Mellitus II Mother     Past Surgical History:  Procedure Laterality Date   4 surgeries right wrist     due to crush   ANTERIOR CERVICAL DECOMP/DISCECTOMY FUSION  12/20/2011   Procedure: ANTERIOR CERVICAL DECOMPRESSION/DISCECTOMY FUSION 2 LEVELS;  Surgeon: Mariam Dollar, MD;  Location: [facility];  Service: Neurosurgery;  Laterality: N/A;  Cervical four-five, five-six anterior cervical decompression/discectomy fusion with plating   BACK SURGERY     6 from degenerative disease.Marland Kitchen  1 upper neck   HIP SURGERY Bilateral    rods to each hip- hip can ot of socket.   TONSILLECTOMY     Social History   Occupational History   Not on file  Tobacco Use   Smoking status: Former    Current packs/day: 0.00    Average packs/day: 1 pack/day for 20.0 years (20.0 ttl pk-yrs)    Types: Cigarettes    Start date: 09/22/1996    Quit date: 09/22/2016    Years since quitting: 6.3   Smokeless tobacco: Never  Vaping Use   Vaping  status: Never Used  Substance and Sexual Activity   Alcohol use: No    Comment: occassional    Drug use: No   Sexual activity: Not on file

## 2023-01-18 ENCOUNTER — Telehealth: Payer: Self-pay | Admitting: Orthopaedic Surgery

## 2023-01-18 NOTE — Telephone Encounter (Signed)
Patient called. He would like to have the surgery on his hip. His cb# [phone]

## 2023-04-20 ENCOUNTER — Telehealth: Payer: Self-pay | Admitting: Orthopaedic Surgery

## 2023-04-20 NOTE — Telephone Encounter (Signed)
Patient called on the phone with Shanda Bumps from [facility] and wanted to let you know he wants to proceed with surgery. CB#[phone]

## 2023-05-28 ENCOUNTER — Telehealth: Payer: Self-pay | Admitting: *Deleted

## 2023-05-28 NOTE — Telephone Encounter (Signed)
   Pre-operative Risk Assessment    Patient Name: [name]  DOB: [birth year] MRN: [id number]  DATE OF LAST VISIT: NONE; PT WILL NEED A NEW PT APPT DATE OF NEXT VISIT: NONE PT WILL NEED A NEW PT APPT.     Request for Surgical Clearance    Procedure:   LEFT TOTAL HIP WITH REMOVAL OF HARDWARE  Date of Surgery:  Clearance 06/11/23                                 Surgeon:  DR. Cheral Almas Surgeon's Group or Practice Name:  [facility] Phone number:  [phone] Fax number:  [phone]   Type of Clearance Requested:   - Medical ; NONE INDICATED TO BE HELD   Type of Anesthesia:  Spinal   Additional requests/questions:    [name]   05/28/2023, 4:14 PM

## 2023-05-29 NOTE — Telephone Encounter (Signed)
LVM to schedule NP preop appt

## 2023-05-29 NOTE — Telephone Encounter (Signed)
I will send a message to our scheduling team to help find a new pt appt for pre op clearance. I will update the surgeon's office the pt is going to need a new pt appt.

## 2023-05-29 NOTE — Telephone Encounter (Signed)
Thank you :)

## 2023-05-30 ENCOUNTER — Telehealth: Payer: Self-pay | Admitting: *Deleted

## 2023-05-30 NOTE — Telephone Encounter (Signed)
Duplicate request.  Preoperative team has already submitted for new patient preop clearance visit.

## 2023-05-30 NOTE — Telephone Encounter (Signed)
Patient is calling to schedule NP preop appt

## 2023-06-05 ENCOUNTER — Other Ambulatory Visit: Payer: Self-pay | Admitting: Physician Assistant

## 2023-06-05 MED ORDER — DOXYCYCLINE HYCLATE 100 MG PO CAPS: 100.0000 mg | ORAL_CAPSULE | Freq: Two times a day (BID) | ORAL | 0 refills | Status: DC

## 2023-06-05 MED ORDER — OXYCODONE-ACETAMINOPHEN 5-325 MG PO TABS: 1.0000 | ORAL_TABLET | Freq: Four times a day (QID) | ORAL | 0 refills | Status: DC | PRN

## 2023-06-05 MED ORDER — METHOCARBAMOL 750 MG PO TABS: 750.0000 mg | ORAL_TABLET | Freq: Two times a day (BID) | ORAL | 2 refills | Status: AC | PRN

## 2023-06-05 MED ORDER — ONDANSETRON HCL 4 MG PO TABS: 4.0000 mg | ORAL_TABLET | Freq: Three times a day (TID) | ORAL | 0 refills | Status: DC | PRN

## 2023-06-05 MED ORDER — DOCUSATE SODIUM 100 MG PO CAPS: 100.0000 mg | ORAL_CAPSULE | Freq: Every day | ORAL | 2 refills | Status: AC | PRN

## 2023-06-05 MED ORDER — ASPIRIN 81 MG PO TBEC: 81.0000 mg | DELAYED_RELEASE_TABLET | Freq: Two times a day (BID) | ORAL | 0 refills | Status: DC

## 2023-06-05 NOTE — Pre-Procedure Instructions (Signed)
Surgical Instructions   Your procedure is scheduled on Monday, December 9th. Report to [facility] Main Entrance "A" at 12:00 P.M., then check in with the Admitting office. Any questions or running late day of surgery: call [phone]  Questions prior to your surgery date: call [phone], Monday-Friday, 8am-4pm. If you experience any cold or flu symptoms such as cough, fever, chills, shortness of breath, etc. between now and your scheduled surgery, please notify us at the above number.     Remember:  Do not eat after midnight the night before your surgery  You may drink clear liquids until 11:30 AM the morning of your surgery.   Clear liquids allowed are: Water, Non-Citrus Juices (without pulp), Carbonated Beverages, Clear Tea (no milk, honey, etc.), Black Coffee Only (NO MILK, CREAM OR POWDERED CREAMER of any kind), and Gatorade.  Patient Instructions  The night before surgery:  No food after midnight. ONLY clear liquids after midnight   The day of surgery (if you have diabetes): Drink ONE (1) 12 oz G2 given to you in your pre admission testing appointment by 11:30 AM the morning of surgery. Drink in one sitting. Do not sip.  This drink was given to you during your hospital  pre-op appointment visit.  Nothing else to drink after completing the  12 oz bottle of G2.         If you have questions, please contact your surgeon's office.    Take these medicines the morning of surgery with A SIP OF WATER  ADVAIR DISKUS  amLODipine (NORVASC)  atenolol (TENORMIN)     May take these medicines IF NEEDED: albuterol (PROVENTIL HFA;VENTOLIN HFA)- bring inhaler with you on day of surgery ipratropium-albuterol (DUONEB)     Follow your prescriber's instructions on when to stop SUBOXONE.  If no instructions were given by your prescriber, then you will need to call the office to get those instructions.    One week prior to surgery, STOP taking any Aspirin (unless otherwise instructed  by your surgeon) Aleve, Naproxen, Ibuprofen, Motrin, Advil, Goody's, BC's, all herbal medications, fish oil, and non-prescription vitamins.   WHAT DO I DO ABOUT MY DIABETES MEDICATION?   Do not take glimepiride (AMARYL) or metFORMIN (GLUCOPHAGE) the morning of surgery.    HOW TO MANAGE YOUR DIABETES BEFORE AND AFTER SURGERY  Why is it important to control my blood sugar before and after surgery? Improving blood sugar levels before and after surgery helps healing and can limit problems. A way of improving blood sugar control is eating a healthy diet by:  Eating less sugar and carbohydrates  Increasing activity/exercise  Talking with your doctor about reaching your blood sugar goals High blood sugars (greater than 180 mg/dL) can raise your risk of infections and slow your recovery, so you will need to focus on controlling your diabetes during the weeks before surgery. Make sure that the doctor who takes care of your diabetes knows about your planned surgery including the date and location.  How do I manage my blood sugar before surgery? Check your blood sugar at least 4 times a day, starting 2 days before surgery, to make sure that the level is not too high or low.  Check your blood sugar the morning of your surgery when you wake up and every 2 hours until you get to the Short Stay unit.  If your blood sugar is less than 70 mg/dL, you will need to treat for low blood sugar: Do not take insulin. Treat a low  blood sugar (less than 70 mg/dL) with  cup of clear juice (cranberry or apple), 4 glucose tablets, OR glucose gel. Recheck blood sugar in 15 minutes after treatment (to make sure it is greater than 70 mg/dL). If your blood sugar is not greater than 70 mg/dL on recheck, call [phone] for further instructions. Report your blood sugar to the short stay nurse when you get to Short Stay.  If you are admitted to the hospital after surgery: Your blood sugar will be checked by the staff  and you will probably be given insulin after surgery (instead of oral diabetes medicines) to make sure you have good blood sugar levels. The goal for blood sugar control after surgery is 80-180 mg/dL.                    Do NOT Smoke (Tobacco/Vaping) for 24 hours prior to your procedure.  If you use a CPAP at night, you may bring your mask/headgear for your overnight stay.   You will be asked to remove any contacts, glasses, piercing's, hearing aid's, dentures/partials prior to surgery. Please bring cases for these items if needed.    Patients discharged the day of surgery will not be allowed to drive home, and someone needs to stay with them for 24 hours.  SURGICAL WAITING ROOM VISITATION Patients may have no more than 2 support people in the waiting area - these visitors may rotate.   Pre-op nurse will coordinate an appropriate time for 1 ADULT support person, who may not rotate, to accompany patient in pre-op.  Children under the age of 50 must have an adult with them who is not the patient and must remain in the main waiting area with an adult.  If the patient needs to stay at the hospital during part of their recovery, the visitor guidelines for inpatient rooms apply.  Please refer to the [facility] website for the visitor guidelines for any additional information.   If you received a COVID test during your pre-op visit  it is requested that you wear a mask when out in public, stay away from anyone that may not be feeling well and notify your surgeon if you develop symptoms. If you have been in contact with anyone that has tested positive in the last 10 days please notify you surgeon.      Pre-operative 5 CHG Bathing Instructions   You can play a key role in reducing the risk of infection after surgery. Your skin needs to be as free of germs as possible. You can reduce the number of germs on your skin by washing with CHG (chlorhexidine gluconate) soap before surgery. CHG is an  antiseptic soap that kills germs and continues to kill germs even after washing.   DO NOT use if you have an allergy to chlorhexidine/CHG or antibacterial soaps. If your skin becomes reddened or irritated, stop using the CHG and notify one of our RNs at [phone].   Please shower with the CHG soap starting 4 days before surgery using the following schedule:     Please keep in mind the following:  DO NOT shave, including legs and underarms, starting the day of your first shower.   You may shave your face at any point before/day of surgery.  Place clean sheets on your bed the day you start using CHG soap. Use a clean washcloth (not used since being washed) for each shower. DO NOT sleep with pets once you start using the CHG.  CHG Shower Instructions:  Wash your face and private area with normal soap. If you choose to wash your hair, wash first with your normal shampoo.  After you use shampoo/soap, rinse your hair and body thoroughly to remove shampoo/soap residue.  Turn the water OFF and apply about 3 tablespoons (45 ml) of CHG soap to a CLEAN washcloth.  Apply CHG soap ONLY FROM YOUR NECK DOWN TO YOUR TOES (washing for 3-5 minutes)  DO NOT use CHG soap on face, private areas, open wounds, or sores.  Pay special attention to the area where your surgery is being performed.  If you are having back surgery, having someone wash your back for you may be helpful. Wait 2 minutes after CHG soap is applied, then you may rinse off the CHG soap.  Pat dry with a clean towel  Put on clean clothes/pajamas   If you choose to wear lotion, please use ONLY the CHG-compatible lotions on the back of this paper.   Additional instructions for the day of surgery: DO NOT APPLY any lotions, deodorants, cologne, or perfumes.   Do not bring valuables to the hospital. [facility] is not responsible for any belongings/valuables. Do not wear nail polish, gel polish, artificial nails, or any other type of covering  on natural nails (fingers and toes) Do not wear jewelry or makeup Put on clean/comfortable clothes.  Please brush your teeth.  Ask your nurse before applying any prescription medications to the skin.     CHG Compatible Lotions   Aveeno Moisturizing lotion  Cetaphil Moisturizing Cream  Cetaphil Moisturizing Lotion  Clairol Herbal Essence Moisturizing Lotion, Dry Skin  Clairol Herbal Essence Moisturizing Lotion, Extra Dry Skin  Clairol Herbal Essence Moisturizing Lotion, Normal Skin  Curel Age Defying Therapeutic Moisturizing Lotion with Alpha Hydroxy  Curel Extreme Care Body Lotion  Curel Soothing Hands Moisturizing Hand Lotion  Curel Therapeutic Moisturizing Cream, Fragrance-Free  Curel Therapeutic Moisturizing Lotion, Fragrance-Free  Curel Therapeutic Moisturizing Lotion, Original Formula  Eucerin Daily Replenishing Lotion  Eucerin Dry Skin Therapy Plus Alpha Hydroxy Crme  Eucerin Dry Skin Therapy Plus Alpha Hydroxy Lotion  Eucerin Original Crme  Eucerin Original Lotion  Eucerin Plus Crme Eucerin Plus Lotion  Eucerin TriLipid Replenishing Lotion  Keri Anti-Bacterial Hand Lotion  Keri Deep Conditioning Original Lotion Dry Skin Formula Softly Scented  Keri Deep Conditioning Original Lotion, Fragrance Free Sensitive Skin Formula  Keri Lotion Fast Absorbing Fragrance Free Sensitive Skin Formula  Keri Lotion Fast Absorbing Softly Scented Dry Skin Formula  Keri Original Lotion  Keri Skin Renewal Lotion Keri Silky Smooth Lotion  Keri Silky Smooth Sensitive Skin Lotion  Nivea Body Creamy Conditioning Oil  Nivea Body Extra Enriched Lotion  Nivea Body Original Lotion  Nivea Body Sheer Moisturizing Lotion Nivea Crme  Nivea Skin Firming Lotion  NutraDerm 30 Skin Lotion  NutraDerm Skin Lotion  NutraDerm Therapeutic Skin Cream  NutraDerm Therapeutic Skin Lotion  ProShield Protective Hand Cream  Provon moisturizing lotion  Please read over the following fact sheets that you  were given.

## 2023-06-06 ENCOUNTER — Telehealth: Payer: Self-pay | Admitting: Orthopaedic Surgery

## 2023-06-06 ENCOUNTER — Encounter (HOSPITAL_COMMUNITY): Payer: Self-pay

## 2023-06-06 ENCOUNTER — Other Ambulatory Visit: Payer: Self-pay | Admitting: Physician Assistant

## 2023-06-06 ENCOUNTER — Telehealth: Payer: Self-pay

## 2023-06-06 ENCOUNTER — Encounter (HOSPITAL_COMMUNITY)
Admission: RE | Admit: 2023-06-06 | Discharge: 2023-06-06 | Disposition: A | Discharge status: Home / Self Care | Payer: Medicare (Managed Care) | Source: Ambulatory Visit | Attending: Orthopaedic Surgery | Admitting: Orthopaedic Surgery

## 2023-06-06 ENCOUNTER — Other Ambulatory Visit: Payer: Self-pay

## 2023-06-06 VITALS — BP 141/89 | HR 63 | Temp 98.7°F | Resp 17 | Ht 68.0 in | Wt 183.5 lb

## 2023-06-06 DIAGNOSIS — Z01818 Encounter for other preprocedural examination: Secondary | ICD-10-CM | POA: Insufficient documentation

## 2023-06-06 DIAGNOSIS — M1652 Unilateral post-traumatic osteoarthritis, left hip: Secondary | ICD-10-CM | POA: Insufficient documentation

## 2023-06-06 LAB — TYPE AND SCREEN
ABO/RH(D): O POS
Antibody Screen: NEGATIVE

## 2023-06-06 LAB — CBC
HCT: 43.7 % (ref 39.0–52.0)
Hemoglobin: 14.2 g/dL (ref 13.0–17.0)
MCH: 28.8 pg (ref 26.0–34.0)
MCHC: 32.5 g/dL (ref 30.0–36.0)
MCV: 88.6 fL (ref 80.0–100.0)
Platelets: 170 10*3/uL (ref 150–400)
RBC: 4.93 MIL/uL (ref 4.22–5.81)
RDW: 13.1 % (ref 11.5–15.5)
WBC: 2.8 10*3/uL — ABNORMAL LOW (ref 4.0–10.5)
nRBC: 0 % (ref 0.0–0.2)

## 2023-06-06 LAB — HEMOGLOBIN A1C
Hgb A1c MFr Bld: 6.2 % — ABNORMAL HIGH (ref 4.8–5.6)
Mean Plasma Glucose: 131.24 mg/dL

## 2023-06-06 LAB — BASIC METABOLIC PANEL
Anion gap: 11 (ref 5–15)
BUN: 6 mg/dL (ref 6–20)
CO2: 25 mmol/L (ref 22–32)
Calcium: 9.3 mg/dL (ref 8.9–10.3)
Chloride: 98 mmol/L (ref 98–111)
Creatinine, Ser: 0.97 mg/dL (ref 0.61–1.24)
GFR, Estimated: >60 mL/min (ref >60)
Glucose, Bld: 119 mg/dL — ABNORMAL HIGH (ref 70–99)
Potassium: 4.3 mmol/L (ref 3.5–5.1)
Sodium: 134 mmol/L — ABNORMAL LOW (ref 135–145)

## 2023-06-06 LAB — SURGICAL PCR SCREEN
MRSA, PCR: NEGATIVE
Staphylococcus aureus: NEGATIVE

## 2023-06-06 NOTE — Progress Notes (Signed)
Nurse attempted to call [facility] to reach out to them without success. [phone])

## 2023-06-06 NOTE — Telephone Encounter (Signed)
Our office has been unable to reach the pt to schedule a new pt appt. Our scheduling team has tried multiple times to reach the pt but no reply.

## 2023-06-06 NOTE — Telephone Encounter (Signed)
Thanks for trying.  Nothing more we can do if the patient is unreachable.  He must not want surgery that badly.

## 2023-06-06 NOTE — Telephone Encounter (Signed)
[name] with [facility] called stating that Rx's for patient need to be faxed to PCP due to patient using mail order.  Fax# [phone].  CB# [phone].  Please advise.  Thank you.

## 2023-06-06 NOTE — Telephone Encounter (Signed)
See message.

## 2023-06-06 NOTE — Progress Notes (Signed)
PCP - [facility] Cardiologist - denies  PPM/ICD - denies Device Orders - n/a Rep Notified - n/a  Chest x-ray - denies EKG - 06-06-23 Stress Test - denies ECHO - denies Cardiac Cath - denies  Sleep Study - denies CPAP - n/a  DM denies patient states "my doctor says no"    Blood Thinner Instructions:denies Aspirin Instructions:n/a  ERAS Protcol -clear liquids PRE-SURGERY G2-   COVID TEST- n/a   Anesthesia review: yes hx of dm, htn,   Patient denies shortness of breath, fever, cough and chest pain at PAT appointment   All instructions explained to the patient, with a verbal understanding of the material. Patient agrees to go over the instructions while at home for a better understanding. Patient also instructed to self quarantine after being tested for COVID-19. The opportunity to ask questions was provided.

## 2023-06-06 NOTE — Telephone Encounter (Signed)
[name] (RN) from [facility] Pre Admissions called with FYI. States pt need cardic clearance and pt informed Sonny Dandy was suppose to get ot done. Pt surgery date is Monday 12/9. [name] phone number is [phone]. [name] also sent a chart message to [facility]

## 2023-06-06 NOTE — Progress Notes (Addendum)
Message left for Jessica at [facility] regarding patient needing a cardiology appointment prior to surgery.  He had received messages from [phone] where they had attempted to call to arrange the appointment.. Nurse did encourage patient to reach out to prescriber of SUBOXONE  for instructions prior to surgery.

## 2023-06-07 NOTE — Telephone Encounter (Signed)
ok 

## 2023-06-07 NOTE — Progress Notes (Signed)
Cardiology Office Note   Date:  06/08/2023   ID:  [name], DOB [birth year], MRN [id number]  PCP:  [facility]  Cardiologist:   Rollene Rotunda, MD Referring:  Tarry Kos, MD  Chief Complaint  Patient presents with   Pre-op Exam      History of Present Illness: [name] is a 57 y.o. male who presents for preop evaluation prior to hip surgery.   He has not had any prior cardiac history.  He did have apparently pulmonary embolism post a back surgery some years ago.  I actually reviewed some notes from hematology when he had coagulopathy at 1 point in time with bleeding and was thought to have accidental exposure to rat poison.    He is due to have a hip surgery on Monday.  He denies any cardiovascular symptoms such as chest pressure, neck or arm discomfort.  He does not have any shortness of breath, PND or orthopnea.  He does have a history of smoking and COPD.  He is limited by his hip pain but gets around with a cane and can walk to the grocery store.  I do note an EKG that was done recently that demonstrates sinus bradycardia with bigeminy but he does not feel palpitations and has had no presyncope or syncope.  Looking through the chart I do not see any other prior cardiovascular testing   Past Medical History:  Diagnosis Date   Anemia 12/2016   in hospital for dcon rat poisioning for 6 days took vitamin k , accidental poisining   Blood dyscrasia    clot on lower spine after 6th back surgery could not move legs, clot removed then could move legs   Complication of anesthesia    after 6th back surgery clot on spine, could not move legs, had to have clot removed then could move legs   COPD (chronic obstructive pulmonary disease) (HCC)    chronic Bronchhititis   DJD (degenerative joint disease)    lower and upper   GERD (gastroesophageal reflux disease)    occ, no meds for   Hypertension    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   Seizures (HCC)    last seizure 5 years ago no cause found for seizures    Past Surgical History:  Procedure Laterality Date   4 surgeries right wrist     due to crush   ANTERIOR CERVICAL DECOMP/DISCECTOMY FUSION  12/20/2011   Procedure: ANTERIOR CERVICAL DECOMPRESSION/DISCECTOMY FUSION 2 LEVELS;  Surgeon: Mariam Dollar, MD;  Location: [facility];  Service: Neurosurgery;  Laterality: N/A;  Cervical four-five, five-six anterior cervical decompression/discectomy fusion with plating   BACK SURGERY     6 from degenerative disease.Marland Kitchen  1 upper neck   HIP SURGERY Bilateral    rods to each hip- hip can ot of socket.   TONSILLECTOMY       Current Outpatient Medications  Medication Sig Dispense Refill   albuterol (PROVENTIL HFA;VENTOLIN HFA) 108 (90 Base) MCG/ACT inhaler Inhale 2 puffs into the lungs every 6 (six) hours as needed for wheezing or shortness of breath.     Cholecalciferol (VITAMIN D3) 50 MCG (2000 UT) CAPS Take 2,000 Units by mouth daily.     docusate sodium (COLACE) 100 MG capsule Take 1 capsule (100 mg total) by mouth daily as needed. (Patient taking differently: Take 100 mg by mouth 2 (two) times daily.) 30  capsule 2   doxycycline (VIBRAMYCIN) 100 MG capsule Take 1 capsule (100 mg total) by mouth 2 (two) times daily. To be taken after surgery 20 capsule 0   gabapentin (NEURONTIN) 400 MG capsule Take 400 mg by mouth 2 (two) times daily.     methocarbamol (ROBAXIN-750) 750 MG tablet Take 1 tablet (750 mg total) by mouth 2 (two) times daily as needed for muscle spasms. 20 tablet 2   metoprolol succinate (TOPROL-XL) 50 MG 24 hr tablet Take 50 mg by mouth daily. Take with or immediately following a meal.     SUBOXONE 4-1 MG FILM Place 1 Film under the tongue in the morning and at bedtime.  0   aspirin EC 81 MG tablet Take 1 tablet (81 mg total) by mouth 2 (two) times daily. To be taken after surgery to prevent blood clots (Patient not taking: Reported on  06/08/2023) 84 tablet 0   ondansetron (ZOFRAN) 4 MG tablet Take 1 tablet (4 mg total) by mouth every 8 (eight) hours as needed for nausea or vomiting. (Patient not taking: Reported on 06/08/2023) 40 tablet 0   oxyCODONE-acetaminophen (PERCOCET) 5-325 MG tablet Take 1-2 tablets by mouth every 6 (six) hours as needed. To be taken after surgery (Patient not taking: Reported on 06/08/2023) 40 tablet 0   psyllium (REGULOID) 0.52 g capsule Take 0.52 g by mouth in the morning and at bedtime. (Patient not taking: Reported on 06/08/2023)     No current facility-administered medications for this visit.   Facility-Administered Medications Ordered in Other Visits  Medication Dose Route Frequency Provider Last Rate Last Admin   [START ON 06/11/2023] tranexamic acid (CYKLOKAPRON) 2,000 mg in sodium chloride 0.9 % 50 mL Topical Application  2,000 mg Topical To OR Tarry Kos, MD        Allergies:   Darvocet [propoxyphene n-acetaminophen] and Onion    Social History:  The patient  reports that he quit smoking about 6 years ago. His smoking use included cigarettes. He started smoking about 26 years ago. He has a 20 pack-year smoking history. He has never used smokeless tobacco. He reports current alcohol use. He reports current drug use. Drug: Marijuana.   Family History:  The patient's family history includes Diabetes Mellitus II in his mother.    ROS:  Please see the history of present illness.   Otherwise, review of systems are positive for none.   All other systems are reviewed and negative.    PHYSICAL EXAM: VS:  BP (!) 134/90 (BP Location: Left Arm, Patient Position: Sitting, Cuff Size: Normal)   Pulse (!) 57   Ht 5\' 9"  (1.753 m)   Wt 183 lb 3.2 oz (83.1 kg)   SpO2 97%   BMI 27.05 kg/m  , BMI Body mass index is 27.05 kg/m. GENERAL:  Well appearing HEENT:  Pupils equal round and reactive, fundi not visualized, oral mucosa unremarkable NECK:  No jugular venous distention, waveform within normal  limits, carotid upstroke brisk and symmetric, no bruits, no thyromegaly LYMPHATICS:  No cervical, inguinal adenopathy LUNGS:  Clear to auscultation bilaterally BACK:  No CVA tenderness CHEST:  Unremarkable HEART:  PMI not displaced or sustained,S1 and S2 within normal limits, no S3, no S4, no clicks, no rubs, no murmurs ABD:  Flat, positive bowel sounds normal in frequency in pitch, no bruits, no rebound, no guarding, no midline pulsatile mass, no hepatomegaly, no splenomegaly EXT:  2 plus pulses throughout, no edema, no cyanosis no clubbing SKIN:  No  rashes no nodules NEURO:  Cranial nerves II through XII grossly intact, motor grossly intact throughout PSYCH:  Cognitively intact, oriented to person place and time    EKG: Sinus rhythm, rate 58, premature atrial contractions in a bigeminal pattern, poor anterior R wave progression, no acute ST-T wave changes.  06/06/2023    Recent Labs: 06/06/2023: BUN 6; Creatinine, Ser 0.97; Hemoglobin 14.2; Platelets 170; Potassium 4.3; Sodium 134    Lipid Panel No results found for: "CHOL", "TRIG", "HDL", "CHOLHDL", "VLDL", "LDLCALC", "LDLDIRECT"    Wt Readings from Last 3 Encounters:  06/08/23 183 lb 3.2 oz (83.1 kg)  06/06/23 183 lb 8 oz (83.2 kg)  12/05/16 161 lb (73 kg)      Other studies Reviewed: Additional studies/ records that were reviewed today include:   Labs, EKG. Review of the above records demonstrates:  Please see elsewhere in the note.     ASSESSMENT AND PLAN:  Preop clearance: The patient is not going for high risk surgery.  He has no high risk symptoms or physical findings.  He has had no past cardiac history.  Despite having some limited functionality he still able to achieve at least 4 METs.,  According to ACC/AHA guidelines the patient is at acceptable risk for the planned procedure.  No further cardiovascular testing is indicated.  Stop smoking.  We talked about the need to quit smoking.   Arrhythmia: He has no  symptomatic arrhythmia.  No change in therapy.   Current medicines are reviewed at length with the patient today.  The patient does not have concerns regarding medicines.  The following changes have been made:  no change  Labs/ tests ordered today include: None No orders of the defined types were placed in this encounter.    Disposition:   FU with with me as needed.     Signed, Rollene Rotunda, MD  06/08/2023 5:12 PM    [facility]

## 2023-06-07 NOTE — Telephone Encounter (Signed)
Dr. Antoine Poche will see pt tomorrow at 2:30 for cardiac clearance

## 2023-06-07 NOTE — Telephone Encounter (Signed)
Sounds like pre-op has not been able to get in touch with patient.  He needs additional clearance.  We wont fax meds over as it sounds like he may be cancelled

## 2023-06-08 ENCOUNTER — Encounter: Payer: Self-pay | Admitting: Cardiology

## 2023-06-08 ENCOUNTER — Other Ambulatory Visit: Payer: Self-pay | Admitting: Physician Assistant

## 2023-06-08 ENCOUNTER — Ambulatory Visit: Payer: Medicare (Managed Care) | Attending: Cardiology | Admitting: Cardiology

## 2023-06-08 VITALS — BP 134/90 | HR 57 | Ht 69.0 in | Wt 183.2 lb

## 2023-06-08 DIAGNOSIS — I499 Cardiac arrhythmia, unspecified: Secondary | ICD-10-CM | POA: Insufficient documentation

## 2023-06-08 MED ORDER — TRANEXAMIC ACID 1000 MG/10ML IV SOLN
2000.0000 mg | INTRAVENOUS | Status: DC
  Filled 2023-06-08 (×2): qty 20

## 2023-06-08 NOTE — Telephone Encounter (Signed)
Script for Percocet, Robaxin, Aspirin were faxed to [facility]. Fax #[phone] Ph# [phone]

## 2023-06-08 NOTE — Progress Notes (Signed)
Patient was called to be informed that the surgery time for Monday was changed. Patient wasn't available. This Clinical research associate left a message. Patient was instructed to be at the hospital Monday morning at 09:15 o'clock and stop drinking clear liquids at 08:45 o'clock.

## 2023-06-08 NOTE — Progress Notes (Signed)
Surgical Instructions     Your procedure is scheduled on Monday, December 9th. Report to [facility] Main Entrance "A" at 12:00 P.M., then check in with the Admitting office. Any questions or running late day of surgery: call [phone]   Questions prior to your surgery date: call [phone], Monday-Friday, 8am-4pm. If you experience any cold or flu symptoms such as cough, fever, chills, shortness of breath, etc. between now and your scheduled surgery, please notify us at the above number.            Remember:       Do not eat after midnight the night before your surgery   You may drink clear liquids until 11:30 AM the morning of your surgery.   Clear liquids allowed are: Water, Non-Citrus Juices (without pulp), Carbonated Beverages, Clear Tea (no milk, honey, etc.), Black Coffee Only (NO MILK, CREAM OR POWDERED CREAMER of any kind), and Gatorade.   Patient Instructions   The night before surgery:  No food after midnight. ONLY clear liquids after midnight     The day of surgery (if you have diabetes): Drink ONE (1) 12 oz G2 given to you in your pre admission testing appointment by 11:30 AM the morning of surgery. Drink in one sitting. Do not sip.  This drink was given to you during your hospital  pre-op appointment visit.  Nothing else to drink after completing the  12 oz bottle of G2.          If you have questions, please contact your surgeon's office.         Take these medicines the morning of surgery with A SIP OF WATER    gabapentin (NEURONTIN)  metoprolol succinate (TOPROL-XL)    May take these medicines IF NEEDED: albuterol (PROVENTIL HFA;VENTOLIN HFA)- bring inhaler with you on day of surgery ipratropium-albuterol (DUONEB)        Follow your prescriber's instructions on when to stop SUBOXONE.  If no instructions were given by your prescriber, then you will need to call the office to get those instructions.     One week prior to surgery, STOP taking any Aspirin  (unless otherwise instructed by your surgeon) Aleve, Naproxen, Ibuprofen, Motrin, Advil, Goody's, BC's, all herbal medications, fish oil, and non-prescription vitamins.   WHAT DO I DO ABOUT MY DIABETES MEDICATION?     Do not take glimepiride (AMARYL) or metFORMIN (GLUCOPHAGE) the morning of surgery.       HOW TO MANAGE YOUR DIABETES BEFORE AND AFTER SURGERY   Why is it important to control my blood sugar before and after surgery? Improving blood sugar levels before and after surgery helps healing and can limit problems. A way of improving blood sugar control is eating a healthy diet by:  Eating less sugar and carbohydrates  Increasing activity/exercise  Talking with your doctor about reaching your blood sugar goals High blood sugars (greater than 180 mg/dL) can raise your risk of infections and slow your recovery, so you will need to focus on controlling your diabetes during the weeks before surgery. Make sure that the doctor who takes care of your diabetes knows about your planned surgery including the date and location.   How do I manage my blood sugar before surgery? Check your blood sugar at least 4 times a day, starting 2 days before surgery, to make sure that the level is not too high or low.   Check your blood sugar the morning of your surgery when you wake up and  every 2 hours until you get to the Short Stay unit.   If your blood sugar is less than 70 mg/dL, you will need to treat for low blood sugar: Do not take insulin. Treat a low blood sugar (less than 70 mg/dL) with  cup of clear juice (cranberry or apple), 4 glucose tablets, OR glucose gel. Recheck blood sugar in 15 minutes after treatment (to make sure it is greater than 70 mg/dL). If your blood sugar is not greater than 70 mg/dL on recheck, call [phone] for further instructions. Report your blood sugar to the short stay nurse when you get to Short Stay.   If you are admitted to the hospital after surgery: Your  blood sugar will be checked by the staff and you will probably be given insulin after surgery (instead of oral diabetes medicines) to make sure you have good blood sugar levels. The goal for blood sugar control after surgery is 80-180 mg/dL.                    Do NOT Smoke (Tobacco/Vaping) for 24 hours prior to your procedure.   If you use a CPAP at night, you may bring your mask/headgear for your overnight stay.   You will be asked to remove any contacts, glasses, piercing's, hearing aid's, dentures/partials prior to surgery. Please bring cases for these items if needed.    Patients discharged the day of surgery will not be allowed to drive home, and someone needs to stay with them for 24 hours.   SURGICAL WAITING ROOM VISITATION Patients may have no more than 2 support people in the waiting area - these visitors may rotate.   Pre-op nurse will coordinate an appropriate time for 1 ADULT support person, who may not rotate, to accompany patient in pre-op.  Children under the age of 73 must have an adult with them who is not the patient and must remain in the main waiting area with an adult.   If the patient needs to stay at the hospital during part of their recovery, the visitor guidelines for inpatient rooms apply.   Please refer to the [facility] website for the visitor guidelines for any additional information.     If you received a COVID test during your pre-op visit  it is requested that you wear a mask when out in public, stay away from anyone that may not be feeling well and notify your surgeon if you develop symptoms. If you have been in contact with anyone that has tested positive in the last 10 days please notify you surgeon.         Pre-operative 5 CHG Bathing Instructions    You can play a key role in reducing the risk of infection after surgery. Your skin needs to be as free of germs as possible. You can reduce the number of germs on your skin by washing with CHG  (chlorhexidine gluconate) soap before surgery. CHG is an antiseptic soap that kills germs and continues to kill germs even after washing.    DO NOT use if you have an allergy to chlorhexidine/CHG or antibacterial soaps. If your skin becomes reddened or irritated, stop using the CHG and notify one of our RNs at [phone].    Please shower with the CHG soap starting 4 days before surgery using the following schedule:       Please keep in mind the following:  DO NOT shave, including legs and underarms, starting the day of your  first shower.   You may shave your face at any point before/day of surgery.  Place clean sheets on your bed the day you start using CHG soap. Use a clean washcloth (not used since being washed) for each shower. DO NOT sleep with pets once you start using the CHG.    CHG Shower Instructions:  Wash your face and private area with normal soap. If you choose to wash your hair, wash first with your normal shampoo.  After you use shampoo/soap, rinse your hair and body thoroughly to remove shampoo/soap residue.  Turn the water OFF and apply about 3 tablespoons (45 ml) of CHG soap to a CLEAN washcloth.  Apply CHG soap ONLY FROM YOUR NECK DOWN TO YOUR TOES (washing for 3-5 minutes)  DO NOT use CHG soap on face, private areas, open wounds, or sores.  Pay special attention to the area where your surgery is being performed.  If you are having back surgery, having someone wash your back for you may be helpful. Wait 2 minutes after CHG soap is applied, then you may rinse off the CHG soap.  Pat dry with a clean towel  Put on clean clothes/pajamas   If you choose to wear lotion, please use ONLY the CHG-compatible lotions on the back of this paper.    Additional instructions for the day of surgery: DO NOT APPLY any lotions, deodorants, cologne, or perfumes.   Do not bring valuables to the hospital. [facility] is not responsible for any belongings/valuables. Do not wear nail  polish, gel polish, artificial nails, or any other type of covering on natural nails (fingers and toes) Do not wear jewelry or makeup Put on clean/comfortable clothes.  Please brush your teeth.  Ask your nurse before applying any prescription medications to the skin.        CHG Compatible Lotions    Aveeno Moisturizing lotion  Cetaphil Moisturizing Cream  Cetaphil Moisturizing Lotion  Clairol Herbal Essence Moisturizing Lotion, Dry Skin  Clairol Herbal Essence Moisturizing Lotion, Extra Dry Skin  Clairol Herbal Essence Moisturizing Lotion, Normal Skin  Curel Age Defying Therapeutic Moisturizing Lotion with Alpha Hydroxy  Curel Extreme Care Body Lotion  Curel Soothing Hands Moisturizing Hand Lotion  Curel Therapeutic Moisturizing Cream, Fragrance-Free  Curel Therapeutic Moisturizing Lotion, Fragrance-Free  Curel Therapeutic Moisturizing Lotion, Original Formula  Eucerin Daily Replenishing Lotion  Eucerin Dry Skin Therapy Plus Alpha Hydroxy Crme  Eucerin Dry Skin Therapy Plus Alpha Hydroxy Lotion  Eucerin Original Crme  Eucerin Original Lotion  Eucerin Plus Crme Eucerin Plus Lotion  Eucerin TriLipid Replenishing Lotion  Keri Anti-Bacterial Hand Lotion  Keri Deep Conditioning Original Lotion Dry Skin Formula Softly Scented  Keri Deep Conditioning Original Lotion, Fragrance Free Sensitive Skin Formula  Keri Lotion Fast Absorbing Fragrance Free Sensitive Skin Formula  Keri Lotion Fast Absorbing Softly Scented Dry Skin Formula  Keri Original Lotion  Keri Skin Renewal Lotion Keri Silky Smooth Lotion  Keri Silky Smooth Sensitive Skin Lotion  Nivea Body Creamy Conditioning Oil  Nivea Body Extra Enriched Lotion  Nivea Body Original Lotion  Nivea Body Sheer Moisturizing Lotion Nivea Crme  Nivea Skin Firming Lotion  NutraDerm 30 Skin Lotion  NutraDerm Skin Lotion  NutraDerm Therapeutic Skin Cream  NutraDerm Therapeutic Skin Lotion  ProShield Protective Hand Cream  Provon  moisturizing lotion   Please read over the following fact sheets that you were given.

## 2023-06-08 NOTE — Patient Instructions (Signed)
Medication Instructions:  No changes. *If you need a refill on your cardiac medications before your next appointment, please call your pharmacy*   Follow-Up: At [facility], you and your health needs are our priority.  As part of our continuing mission to provide you with exceptional heart care, we have created designated Provider Care Teams.  These Care Teams include your primary Cardiologist (physician) and Advanced Practice Providers (APPs -  Physician Assistants and Nurse Practitioners) who all work together to provide you with the care you need, when you need it.  We recommend signing up for the patient portal called "MyChart".  Sign up information is provided on this After Visit Summary.  MyChart is used to connect with patients for Virtual Visits (Telemedicine).  Patients are able to view lab/test results, encounter notes, upcoming appointments, etc.  Non-urgent messages can be sent to your provider as well.   To learn more about what you can do with MyChart, go to [email or web address].    Your next appointment:    As needed.   Provider:   Rollene Rotunda, MD

## 2023-06-11 ENCOUNTER — Ambulatory Visit (HOSPITAL_COMMUNITY): Payer: Medicare (Managed Care)

## 2023-06-11 ENCOUNTER — Inpatient Hospital Stay (HOSPITAL_COMMUNITY)
Admission: AD | Admit: 2023-06-11 | Discharge: 2023-06-16 | DRG: 470 | Disposition: A | Discharge status: Home Health | Payer: Medicare (Managed Care) | Attending: Orthopaedic Surgery | Admitting: Orthopaedic Surgery

## 2023-06-11 ENCOUNTER — Ambulatory Visit (HOSPITAL_COMMUNITY): Payer: Medicare (Managed Care) | Admitting: Physician Assistant

## 2023-06-11 ENCOUNTER — Encounter (HOSPITAL_COMMUNITY)
Admission: AD | Disposition: A | Discharge status: Home Health | Payer: Self-pay | Source: Home / Self Care | Attending: Orthopaedic Surgery

## 2023-06-11 ENCOUNTER — Other Ambulatory Visit: Payer: Self-pay

## 2023-06-11 ENCOUNTER — Encounter (HOSPITAL_COMMUNITY): Payer: Self-pay | Admitting: Orthopaedic Surgery

## 2023-06-11 ENCOUNTER — Ambulatory Visit (HOSPITAL_COMMUNITY): Payer: Medicare (Managed Care) | Admitting: Certified Registered Nurse Anesthetist

## 2023-06-11 ENCOUNTER — Observation Stay (HOSPITAL_COMMUNITY): Payer: Medicare (Managed Care)

## 2023-06-11 DIAGNOSIS — Z9889 Other specified postprocedural states: Secondary | ICD-10-CM

## 2023-06-11 DIAGNOSIS — Z8709 Personal history of other diseases of the respiratory system: Secondary | ICD-10-CM

## 2023-06-11 DIAGNOSIS — L930 Discoid lupus erythematosus: Secondary | ICD-10-CM | POA: Diagnosis present

## 2023-06-11 DIAGNOSIS — M65952 Unspecified synovitis and tenosynovitis, left thigh: Secondary | ICD-10-CM | POA: Diagnosis present

## 2023-06-11 DIAGNOSIS — Z833 Family history of diabetes mellitus: Secondary | ICD-10-CM

## 2023-06-11 DIAGNOSIS — Z9089 Acquired absence of other organs: Secondary | ICD-10-CM

## 2023-06-11 DIAGNOSIS — Z96642 Presence of left artificial hip joint: Secondary | ICD-10-CM

## 2023-06-11 DIAGNOSIS — M1652 Unilateral post-traumatic osteoarthritis, left hip: Secondary | ICD-10-CM | POA: Diagnosis not present

## 2023-06-11 DIAGNOSIS — Z981 Arthrodesis status: Secondary | ICD-10-CM

## 2023-06-11 DIAGNOSIS — Z91018 Allergy to other foods: Secondary | ICD-10-CM

## 2023-06-11 DIAGNOSIS — R569 Unspecified convulsions: Secondary | ICD-10-CM | POA: Diagnosis present

## 2023-06-11 DIAGNOSIS — Z79899 Other long term (current) drug therapy: Secondary | ICD-10-CM

## 2023-06-11 DIAGNOSIS — Z87891 Personal history of nicotine dependence: Secondary | ICD-10-CM

## 2023-06-11 DIAGNOSIS — Z967 Presence of other bone and tendon implants: Secondary | ICD-10-CM | POA: Diagnosis present

## 2023-06-11 DIAGNOSIS — K0889 Other specified disorders of teeth and supporting structures: Secondary | ICD-10-CM | POA: Diagnosis present

## 2023-06-11 DIAGNOSIS — Z602 Problems related to living alone: Secondary | ICD-10-CM | POA: Diagnosis present

## 2023-06-11 DIAGNOSIS — I10 Essential (primary) hypertension: Secondary | ICD-10-CM | POA: Diagnosis present

## 2023-06-11 DIAGNOSIS — M47816 Spondylosis without myelopathy or radiculopathy, lumbar region: Secondary | ICD-10-CM | POA: Diagnosis present

## 2023-06-11 DIAGNOSIS — Z8719 Personal history of other diseases of the digestive system: Secondary | ICD-10-CM

## 2023-06-11 DIAGNOSIS — Z862 Personal history of diseases of the blood and blood-forming organs and certain disorders involving the immune mechanism: Secondary | ICD-10-CM

## 2023-06-11 DIAGNOSIS — J449 Chronic obstructive pulmonary disease, unspecified: Secondary | ICD-10-CM | POA: Diagnosis present

## 2023-06-11 DIAGNOSIS — M1612 Unilateral primary osteoarthritis, left hip: Secondary | ICD-10-CM

## 2023-06-11 DIAGNOSIS — Z7982 Long term (current) use of aspirin: Secondary | ICD-10-CM

## 2023-06-11 DIAGNOSIS — Z885 Allergy status to narcotic agent status: Secondary | ICD-10-CM

## 2023-06-11 HISTORY — PX: HARDWARE REMOVAL: SHX979

## 2023-06-11 HISTORY — PX: TOTAL HIP ARTHROPLASTY: SHX124

## 2023-06-11 LAB — GLUCOSE, CAPILLARY: Glucose-Capillary: 126 mg/dL — ABNORMAL HIGH (ref 70–99)

## 2023-06-11 SURGERY — ARTHROPLASTY, HIP, TOTAL, ANTERIOR APPROACH
Anesthesia: General | Site: Hip | Laterality: Left

## 2023-06-11 MED ORDER — PANTOPRAZOLE SODIUM 40 MG PO TBEC
40.0000 mg | DELAYED_RELEASE_TABLET | Freq: Every day | ORAL | Status: DC
  Administered 2023-06-11 – 2023-06-16 (×6): 40 mg via ORAL
  Filled 2023-06-11 (×6): qty 1

## 2023-06-11 MED ORDER — CEFAZOLIN SODIUM-DEXTROSE 2-4 GM/100ML-% IV SOLN
2.0000 g | Freq: Four times a day (QID) | INTRAVENOUS | Status: AC
  Administered 2023-06-11 – 2023-06-12 (×2): 2 g via INTRAVENOUS
  Filled 2023-06-11 (×2): qty 100

## 2023-06-11 MED ORDER — TRANEXAMIC ACID 1000 MG/10ML IV SOLN
INTRAVENOUS | Status: DC | PRN
  Administered 2023-06-11: 2000 mg via TOPICAL

## 2023-06-11 MED ORDER — TRANEXAMIC ACID-NACL 1000-0.7 MG/100ML-% IV SOLN
1000.0000 mg | Freq: Once | INTRAVENOUS | Status: AC
  Administered 2023-06-11: 1000 mg via INTRAVENOUS
  Filled 2023-06-11: qty 100

## 2023-06-11 MED ORDER — OXYCODONE HCL ER 10 MG PO T12A
10.0000 mg | EXTENDED_RELEASE_TABLET | Freq: Two times a day (BID) | ORAL | Status: DC
Start: 2023-06-11 — End: 2023-06-16
  Administered 2023-06-11 – 2023-06-16 (×10): 10 mg via ORAL
  Filled 2023-06-11 (×10): qty 1

## 2023-06-11 MED ORDER — ONDANSETRON HCL 4 MG/2ML IJ SOLN
4.0000 mg | Freq: Four times a day (QID) | INTRAMUSCULAR | Status: DC | PRN
Start: 2023-06-11 — End: 2023-06-16

## 2023-06-11 MED ORDER — BUPIVACAINE-MELOXICAM ER 400-12 MG/14ML IJ SOLN
INTRAMUSCULAR | Status: DC | PRN
  Administered 2023-06-11: 400 mg

## 2023-06-11 MED ORDER — HYDROMORPHONE HCL 1 MG/ML IJ SOLN
0.5000 mg | INTRAMUSCULAR | Status: DC | PRN
  Administered 2023-06-12 – 2023-06-14 (×4): 1 mg via INTRAVENOUS
  Administered 2023-06-14: 0.5 mg via INTRAVENOUS
  Administered 2023-06-15: 1 mg via INTRAVENOUS
  Filled 2023-06-11 (×7): qty 1

## 2023-06-11 MED ORDER — ONDANSETRON HCL 4 MG/2ML IJ SOLN
INTRAMUSCULAR | Status: AC
  Filled 2023-06-11: qty 2

## 2023-06-11 MED ORDER — OXYCODONE HCL 5 MG PO TABS
5.0000 mg | ORAL_TABLET | ORAL | Status: DC | PRN
  Administered 2023-06-14: 5 mg via ORAL
  Administered 2023-06-16: 10 mg via ORAL
  Filled 2023-06-11: qty 1
  Filled 2023-06-11: qty 2

## 2023-06-11 MED ORDER — DOXYCYCLINE HYCLATE 100 MG PO TABS
100.0000 mg | ORAL_TABLET | Freq: Two times a day (BID) | ORAL | Status: DC
  Administered 2023-06-11 – 2023-06-16 (×10): 100 mg via ORAL
  Filled 2023-06-11 (×10): qty 1

## 2023-06-11 MED ORDER — METHOCARBAMOL 1000 MG/10ML IJ SOLN: 500.0000 mg | Freq: Four times a day (QID) | INTRAMUSCULAR | Status: DC | PRN

## 2023-06-11 MED ORDER — DOXYCYCLINE HYCLATE 100 MG PO CAPS: 100.0000 mg | ORAL_CAPSULE | Freq: Two times a day (BID) | ORAL | 0 refills | Status: DC

## 2023-06-11 MED ORDER — FENTANYL CITRATE (PF) 100 MCG/2ML IJ SOLN
INTRAMUSCULAR | Status: AC
  Filled 2023-06-11: qty 2

## 2023-06-11 MED ORDER — DOCUSATE SODIUM 100 MG PO CAPS
100.0000 mg | ORAL_CAPSULE | Freq: Two times a day (BID) | ORAL | Status: DC
Start: 2023-06-11 — End: 2023-06-16
  Administered 2023-06-11 – 2023-06-16 (×10): 100 mg via ORAL
  Filled 2023-06-11 (×10): qty 1

## 2023-06-11 MED ORDER — ROCURONIUM BROMIDE 10 MG/ML (PF) SYRINGE
PREFILLED_SYRINGE | INTRAVENOUS | Status: AC
  Filled 2023-06-11: qty 10

## 2023-06-11 MED ORDER — ONDANSETRON HCL 4 MG/2ML IJ SOLN
INTRAMUSCULAR | Status: DC | PRN
  Administered 2023-06-11: 4 mg via INTRAVENOUS

## 2023-06-11 MED ORDER — ALUM & MAG HYDROXIDE-SIMETH 200-200-20 MG/5ML PO SUSP: 30.0000 mL | ORAL | Status: DC | PRN

## 2023-06-11 MED ORDER — METOCLOPRAMIDE HCL 5 MG PO TABS: 5.0000 mg | ORAL_TABLET | Freq: Three times a day (TID) | ORAL | Status: DC | PRN

## 2023-06-11 MED ORDER — CHLORHEXIDINE GLUCONATE 0.12 % MT SOLN
OROMUCOSAL | Status: AC
  Administered 2023-06-11: 15 mL via OROMUCOSAL
  Filled 2023-06-11: qty 15

## 2023-06-11 MED ORDER — HYDROMORPHONE HCL 1 MG/ML IJ SOLN
INTRAMUSCULAR | Status: AC
  Filled 2023-06-11: qty 0.5

## 2023-06-11 MED ORDER — METOPROLOL SUCCINATE ER 25 MG PO TB24
50.0000 mg | ORAL_TABLET | Freq: Every day | ORAL | Status: DC
  Administered 2023-06-11: 50 mg via ORAL

## 2023-06-11 MED ORDER — FENTANYL CITRATE (PF) 250 MCG/5ML IJ SOLN
INTRAMUSCULAR | Status: AC
  Filled 2023-06-11: qty 5

## 2023-06-11 MED ORDER — LABETALOL HCL 5 MG/ML IV SOLN
INTRAVENOUS | Status: DC | PRN
  Administered 2023-06-11: 15 mg via INTRAVENOUS

## 2023-06-11 MED ORDER — POLYETHYLENE GLYCOL 3350 17 G PO PACK
17.0000 g | PACK | Freq: Every day | ORAL | Status: DC
  Administered 2023-06-11 – 2023-06-16 (×6): 17 g via ORAL
  Filled 2023-06-11 (×6): qty 1

## 2023-06-11 MED ORDER — ACETAMINOPHEN 10 MG/ML IV SOLN: 1000.0000 mg | Freq: Once | INTRAVENOUS | Status: DC | PRN

## 2023-06-11 MED ORDER — BUPIVACAINE-MELOXICAM ER 400-12 MG/14ML IJ SOLN
INTRAMUSCULAR | Status: AC
  Filled 2023-06-11: qty 1

## 2023-06-11 MED ORDER — METOPROLOL SUCCINATE ER 25 MG PO TB24
ORAL_TABLET | ORAL | Status: AC
  Filled 2023-06-11: qty 2

## 2023-06-11 MED ORDER — OXYCODONE HCL 5 MG/5ML PO SOLN: 5.0000 mg | Freq: Once | ORAL | Status: AC | PRN

## 2023-06-11 MED ORDER — DEXAMETHASONE SODIUM PHOSPHATE 10 MG/ML IJ SOLN
10.0000 mg | Freq: Once | INTRAMUSCULAR | Status: AC
  Administered 2023-06-12: 10 mg via INTRAVENOUS
  Filled 2023-06-11: qty 1

## 2023-06-11 MED ORDER — DEXMEDETOMIDINE HCL IN NACL 80 MCG/20ML IV SOLN
INTRAVENOUS | Status: AC
  Filled 2023-06-11: qty 20

## 2023-06-11 MED ORDER — ALBUMIN HUMAN 5 % IV SOLN: INTRAVENOUS | Status: DC | PRN

## 2023-06-11 MED ORDER — METHOCARBAMOL 500 MG PO TABS
500.0000 mg | ORAL_TABLET | Freq: Four times a day (QID) | ORAL | Status: DC | PRN
  Administered 2023-06-11 – 2023-06-13 (×5): 500 mg via ORAL
  Filled 2023-06-11 (×4): qty 1

## 2023-06-11 MED ORDER — ALBUTEROL SULFATE (2.5 MG/3ML) 0.083% IN NEBU: 2.5000 mg | INHALATION_SOLUTION | Freq: Four times a day (QID) | RESPIRATORY_TRACT | Status: DC | PRN

## 2023-06-11 MED ORDER — BUPRENORPHINE HCL-NALOXONE HCL 8-2 MG SL SUBL
0.5000 | SUBLINGUAL_TABLET | Freq: Every day | SUBLINGUAL | Status: DC
  Administered 2023-06-11 – 2023-06-16 (×6): 0.5 via SUBLINGUAL
  Filled 2023-06-11 (×6): qty 1

## 2023-06-11 MED ORDER — DEXAMETHASONE SODIUM PHOSPHATE 10 MG/ML IJ SOLN
INTRAMUSCULAR | Status: AC
  Filled 2023-06-11: qty 1

## 2023-06-11 MED ORDER — ACETAMINOPHEN 10 MG/ML IV SOLN
INTRAVENOUS | Status: AC
  Administered 2023-06-11: 1000 mg via INTRAVENOUS
  Filled 2023-06-11: qty 100

## 2023-06-11 MED ORDER — POVIDONE-IODINE 10 % EX SWAB
2.0000 | Freq: Once | CUTANEOUS | Status: AC
  Administered 2023-06-11: 2 via TOPICAL

## 2023-06-11 MED ORDER — TRANEXAMIC ACID-NACL 1000-0.7 MG/100ML-% IV SOLN
1000.0000 mg | INTRAVENOUS | Status: AC
  Administered 2023-06-11: 1000 mg via INTRAVENOUS

## 2023-06-11 MED ORDER — SODIUM CHLORIDE 0.9 % IV SOLN: INTRAVENOUS | Status: DC | PRN

## 2023-06-11 MED ORDER — OXYCODONE HCL 5 MG PO TABS
ORAL_TABLET | ORAL | Status: AC
  Filled 2023-06-11: qty 1

## 2023-06-11 MED ORDER — MAGNESIUM CITRATE PO SOLN
1.0000 | Freq: Once | ORAL | Status: DC | PRN
Start: 2023-06-11 — End: 2023-06-16

## 2023-06-11 MED ORDER — KETAMINE HCL 50 MG/5ML IJ SOSY
PREFILLED_SYRINGE | INTRAMUSCULAR | Status: DC | PRN
  Administered 2023-06-11 (×2): 20 mg via INTRAVENOUS
  Administered 2023-06-11: 10 mg via INTRAVENOUS

## 2023-06-11 MED ORDER — METOCLOPRAMIDE HCL 5 MG/ML IJ SOLN
5.0000 mg | Freq: Three times a day (TID) | INTRAMUSCULAR | Status: DC | PRN
Start: 2023-06-11 — End: 2023-06-16

## 2023-06-11 MED ORDER — PROPOFOL 10 MG/ML IV BOLUS
INTRAVENOUS | Status: DC | PRN
  Administered 2023-06-11: 150 mg via INTRAVENOUS
  Administered 2023-06-11 (×4): 50 mg via INTRAVENOUS

## 2023-06-11 MED ORDER — LACTATED RINGERS IV SOLN: INTRAVENOUS | Status: DC | PRN

## 2023-06-11 MED ORDER — FENTANYL CITRATE (PF) 250 MCG/5ML IJ SOLN
INTRAMUSCULAR | Status: DC | PRN
  Administered 2023-06-11: 50 ug via INTRAVENOUS
  Administered 2023-06-11 (×2): 100 ug via INTRAVENOUS

## 2023-06-11 MED ORDER — SORBITOL 70 % SOLN: 30.0000 mL | Freq: Every day | Status: DC | PRN

## 2023-06-11 MED ORDER — DIPHENHYDRAMINE HCL 12.5 MG/5ML PO ELIX: 25.0000 mg | ORAL_SOLUTION | ORAL | Status: DC | PRN

## 2023-06-11 MED ORDER — AMISULPRIDE (ANTIEMETIC) 5 MG/2ML IV SOLN: 10.0000 mg | Freq: Once | INTRAVENOUS | Status: DC | PRN

## 2023-06-11 MED ORDER — VANCOMYCIN HCL 1 G IV SOLR
INTRAVENOUS | Status: DC | PRN
  Administered 2023-06-11: 1000 mg

## 2023-06-11 MED ORDER — ROCURONIUM BROMIDE 10 MG/ML (PF) SYRINGE
PREFILLED_SYRINGE | INTRAVENOUS | Status: DC | PRN
  Administered 2023-06-11: 60 mg via INTRAVENOUS
  Administered 2023-06-11: 10 mg via INTRAVENOUS
  Administered 2023-06-11: 20 mg via INTRAVENOUS
  Administered 2023-06-11: 10 mg via INTRAVENOUS

## 2023-06-11 MED ORDER — PRONTOSAN WOUND IRRIGATION OPTIME
TOPICAL | Status: DC | PRN
  Administered 2023-06-11: 1 via TOPICAL

## 2023-06-11 MED ORDER — ORAL CARE MOUTH RINSE: 15.0000 mL | Freq: Once | OROMUCOSAL | Status: AC

## 2023-06-11 MED ORDER — PROPOFOL 10 MG/ML IV BOLUS
INTRAVENOUS | Status: AC
  Filled 2023-06-11: qty 20

## 2023-06-11 MED ORDER — TRANEXAMIC ACID-NACL 1000-0.7 MG/100ML-% IV SOLN
INTRAVENOUS | Status: AC
  Filled 2023-06-11: qty 100

## 2023-06-11 MED ORDER — METHOCARBAMOL 500 MG PO TABS
ORAL_TABLET | ORAL | Status: AC
  Filled 2023-06-11: qty 1

## 2023-06-11 MED ORDER — HYDROMORPHONE HCL 1 MG/ML IJ SOLN
INTRAMUSCULAR | Status: DC | PRN
  Administered 2023-06-11: .5 mg via INTRAVENOUS

## 2023-06-11 MED ORDER — CEFAZOLIN SODIUM-DEXTROSE 2-4 GM/100ML-% IV SOLN
2.0000 g | INTRAVENOUS | Status: AC
  Administered 2023-06-11: 2 g via INTRAVENOUS

## 2023-06-11 MED ORDER — DEXAMETHASONE SODIUM PHOSPHATE 10 MG/ML IJ SOLN
INTRAMUSCULAR | Status: DC | PRN
  Administered 2023-06-11: 5 mg via INTRAVENOUS

## 2023-06-11 MED ORDER — ONDANSETRON HCL 4 MG PO TABS: 4.0000 mg | ORAL_TABLET | Freq: Four times a day (QID) | ORAL | Status: DC | PRN

## 2023-06-11 MED ORDER — SUGAMMADEX SODIUM 200 MG/2ML IV SOLN
INTRAVENOUS | Status: DC | PRN
  Administered 2023-06-11: 200 mg via INTRAVENOUS

## 2023-06-11 MED ORDER — PHENOL 1.4 % MT LIQD: 1.0000 | OROMUCOSAL | Status: DC | PRN

## 2023-06-11 MED ORDER — 0.9 % SODIUM CHLORIDE (POUR BTL) OPTIME
TOPICAL | Status: DC | PRN
  Administered 2023-06-11: 1000 mL

## 2023-06-11 MED ORDER — DEXMEDETOMIDINE HCL IN NACL 80 MCG/20ML IV SOLN
INTRAVENOUS | Status: DC | PRN
  Administered 2023-06-11 (×2): 16 ug via INTRAVENOUS
  Administered 2023-06-11: 8 ug via INTRAVENOUS

## 2023-06-11 MED ORDER — KETAMINE HCL 50 MG/5ML IJ SOSY
PREFILLED_SYRINGE | INTRAMUSCULAR | Status: AC
  Filled 2023-06-11: qty 5

## 2023-06-11 MED ORDER — GABAPENTIN 400 MG PO CAPS
400.0000 mg | ORAL_CAPSULE | Freq: Two times a day (BID) | ORAL | Status: DC
  Administered 2023-06-11 – 2023-06-16 (×10): 400 mg via ORAL
  Filled 2023-06-11 (×10): qty 1

## 2023-06-11 MED ORDER — METOPROLOL SUCCINATE ER 50 MG PO TB24
50.0000 mg | ORAL_TABLET | Freq: Every day | ORAL | Status: DC
  Administered 2023-06-12 – 2023-06-16 (×5): 50 mg via ORAL
  Filled 2023-06-11 (×6): qty 1

## 2023-06-11 MED ORDER — LIDOCAINE 2% (20 MG/ML) 5 ML SYRINGE
INTRAMUSCULAR | Status: DC | PRN
  Administered 2023-06-11: 60 mg via INTRAVENOUS

## 2023-06-11 MED ORDER — CHLORHEXIDINE GLUCONATE 0.12 % MT SOLN: 15.0000 mL | Freq: Once | OROMUCOSAL | Status: AC

## 2023-06-11 MED ORDER — ASPIRIN 81 MG PO CHEW
81.0000 mg | CHEWABLE_TABLET | Freq: Two times a day (BID) | ORAL | Status: DC
Start: 2023-06-11 — End: 2023-06-16
  Administered 2023-06-11 – 2023-06-16 (×10): 81 mg via ORAL
  Filled 2023-06-11 (×10): qty 1

## 2023-06-11 MED ORDER — FERROUS SULFATE 325 (65 FE) MG PO TABS
325.0000 mg | ORAL_TABLET | Freq: Three times a day (TID) | ORAL | Status: DC
  Administered 2023-06-12 – 2023-06-16 (×12): 325 mg via ORAL
  Filled 2023-06-11 (×13): qty 1

## 2023-06-11 MED ORDER — ACETAMINOPHEN 500 MG PO TABS
1000.0000 mg | ORAL_TABLET | Freq: Four times a day (QID) | ORAL | Status: AC
  Administered 2023-06-12 (×3): 1000 mg via ORAL
  Filled 2023-06-11 (×4): qty 2

## 2023-06-11 MED ORDER — OXYCODONE HCL 5 MG PO TABS
5.0000 mg | ORAL_TABLET | Freq: Once | ORAL | Status: AC | PRN
  Administered 2023-06-11: 5 mg via ORAL

## 2023-06-11 MED ORDER — SODIUM CHLORIDE 0.9 % IR SOLN
Status: DC | PRN
  Administered 2023-06-11: 1000 mL

## 2023-06-11 MED ORDER — CEFAZOLIN SODIUM-DEXTROSE 2-4 GM/100ML-% IV SOLN
INTRAVENOUS | Status: AC
  Filled 2023-06-11: qty 100

## 2023-06-11 MED ORDER — ACETAMINOPHEN 325 MG PO TABS: 325.0000 mg | ORAL_TABLET | Freq: Four times a day (QID) | ORAL | Status: DC | PRN

## 2023-06-11 MED ORDER — FENTANYL CITRATE (PF) 100 MCG/2ML IJ SOLN
25.0000 ug | INTRAMUSCULAR | Status: DC | PRN
  Administered 2023-06-11 (×3): 50 ug via INTRAVENOUS

## 2023-06-11 MED ORDER — VANCOMYCIN HCL 1000 MG IV SOLR
INTRAVENOUS | Status: AC
  Filled 2023-06-11: qty 20

## 2023-06-11 MED ORDER — MENTHOL 3 MG MT LOZG: 1.0000 | LOZENGE | OROMUCOSAL | Status: DC | PRN

## 2023-06-11 MED ORDER — OXYCODONE HCL 5 MG PO TABS
10.0000 mg | ORAL_TABLET | ORAL | Status: DC | PRN
  Administered 2023-06-11 – 2023-06-12 (×4): 15 mg via ORAL
  Administered 2023-06-12: 10 mg via ORAL
  Administered 2023-06-13 (×3): 15 mg via ORAL
  Administered 2023-06-15: 10 mg via ORAL
  Administered 2023-06-15 – 2023-06-16 (×2): 15 mg via ORAL
  Filled 2023-06-11 (×2): qty 3
  Filled 2023-06-11: qty 2
  Filled 2023-06-11 (×6): qty 3
  Filled 2023-06-11: qty 2
  Filled 2023-06-11: qty 3

## 2023-06-11 SURGICAL SUPPLY — 55 items
BAG COUNTER SPONGE SURGICOUNT (BAG) ×2 IMPLANT
BAG DECANTER FOR FLEXI CONT (MISCELLANEOUS) ×2 IMPLANT
BLADE SAG 18X100X1.27 (BLADE) ×2 IMPLANT
COVER PERINEAL POST (MISCELLANEOUS) ×2 IMPLANT
COVER SURGICAL LIGHT HANDLE (MISCELLANEOUS) ×2 IMPLANT
DERMABOND ADVANCED .7 DNX12 (GAUZE/BANDAGES/DRESSINGS) IMPLANT
DRAPE C-ARM 42X72 X-RAY (DRAPES) ×2 IMPLANT
DRAPE POUCH INSTRU U-SHP 10X18 (DRAPES) ×2 IMPLANT
DRAPE STERI IOBAN 125X83 (DRAPES) ×2 IMPLANT
DRAPE U-SHAPE 47X51 STRL (DRAPES) ×4 IMPLANT
DRSG AQUACEL AG ADV 3.5X 6 (GAUZE/BANDAGES/DRESSINGS) IMPLANT
DRSG AQUACEL AG ADV 3.5X10 (GAUZE/BANDAGES/DRESSINGS) ×2 IMPLANT
DURAPREP 26ML APPLICATOR (WOUND CARE) ×4 IMPLANT
ELECT BLADE 4.0 EZ CLEAN MEGAD (MISCELLANEOUS) ×2
ELECT REM PT RETURN 9FT ADLT (ELECTROSURGICAL) ×2
ELECTRODE BLDE 4.0 EZ CLN MEGD (MISCELLANEOUS) ×2 IMPLANT
ELECTRODE REM PT RTRN 9FT ADLT (ELECTROSURGICAL) ×2 IMPLANT
GLOVE BIOGEL PI IND STRL 7.0 (GLOVE) ×4 IMPLANT
GLOVE BIOGEL PI IND STRL 7.5 (GLOVE) ×10 IMPLANT
GLOVE ECLIPSE 7.0 STRL STRAW (GLOVE) ×4 IMPLANT
GLOVE SKINSENSE STRL SZ7.5 (GLOVE) ×2 IMPLANT
GLOVE SURG SYN 7.5 E (GLOVE) ×4 IMPLANT
GLOVE SURG SYN 7.5 PF PI (GLOVE) ×4 IMPLANT
GLOVE SURG UNDER POLY LF SZ7 (GLOVE) ×6 IMPLANT
GLOVE SURG UNDER POLY LF SZ7.5 (GLOVE) ×4 IMPLANT
GOWN STRL REUS W/ TWL LRG LVL3 (GOWN DISPOSABLE) IMPLANT
GOWN STRL REUS W/ TWL XL LVL3 (GOWN DISPOSABLE) ×2 IMPLANT
GOWN STRL SURGICAL XL XLNG (GOWN DISPOSABLE) ×2 IMPLANT
GOWN TOGA ZIPPER T7+ PEEL AWAY (MISCELLANEOUS) ×4 IMPLANT
HEAD CERAMIC DELTA 36 PLUS 1.5 (Hips) IMPLANT
HOOD PEEL AWAY T7 (MISCELLANEOUS) ×2 IMPLANT
IV NS IRRIG 3000ML ARTHROMATIC (IV SOLUTION) ×2 IMPLANT
KIT BASIN OR (CUSTOM PROCEDURE TRAY) ×2 IMPLANT
LINER NEUTRAL 52X36MM PLUS 4 (Liner) IMPLANT
MARKER SKIN DUAL TIP RULER LAB (MISCELLANEOUS) ×2 IMPLANT
NDL SPNL 18GX3.5 QUINCKE PK (NEEDLE) ×2 IMPLANT
NEEDLE SPNL 18GX3.5 QUINCKE PK (NEEDLE) ×2 IMPLANT
PACK TOTAL JOINT (CUSTOM PROCEDURE TRAY) ×2 IMPLANT
PACK UNIVERSAL I (CUSTOM PROCEDURE TRAY) ×2 IMPLANT
PIN SECTOR W/GRIP ACE CUP 52MM (Hips) IMPLANT
SCREW 6.5MMX25MM (Screw) IMPLANT
SET HNDPC FAN SPRY TIP SCT (DISPOSABLE) ×2 IMPLANT
SOLUTION PRONTOSAN WOUND 350ML (IRRIGATION / IRRIGATOR) ×2 IMPLANT
STAPLER VISISTAT 35W (STAPLE) IMPLANT
STEM FEM ACTIS HIGH SZ3 (Stem) IMPLANT
SUT ETHIBOND 2 V 37 (SUTURE) ×2 IMPLANT
SUT VIC AB 0 CT1 27XBRD ANBCTR (SUTURE) ×2 IMPLANT
SUT VIC AB 1 CTX36XBRD ANBCTR (SUTURE) ×2 IMPLANT
SUT VIC AB 2-0 CT1 TAPERPNT 27 (SUTURE) ×4 IMPLANT
SYR 50ML LL SCALE MARK (SYRINGE) ×2 IMPLANT
TOWEL GREEN STERILE (TOWEL DISPOSABLE) ×2 IMPLANT
TRAY CATH INTERMITTENT SS 16FR (CATHETERS) IMPLANT
TRAY FOLEY W/BAG SLVR 16FR ST (SET/KITS/TRAYS/PACK) IMPLANT
TUBE SUCT ARGYLE STRL (TUBING) ×2 IMPLANT
YANKAUER SUCT BULB TIP NO VENT (SUCTIONS) ×2 IMPLANT

## 2023-06-11 NOTE — Discharge Instructions (Addendum)

## 2023-06-11 NOTE — H&P (Signed)
PREOPERATIVE H&P  Chief Complaint: left hip degenerative joint disease  HPI: [name] is a 57 y.o. male who presents for surgical treatment of left hip degenerative joint disease.  He denies any changes in medical history.  Past Surgical History:  Procedure Laterality Date   4 surgeries right wrist     due to crush   ANTERIOR CERVICAL DECOMP/DISCECTOMY FUSION  12/20/2011   Procedure: ANTERIOR CERVICAL DECOMPRESSION/DISCECTOMY FUSION 2 LEVELS;  Surgeon: Mariam Dollar, MD;  Location: [facility];  Service: Neurosurgery;  Laterality: N/A;  Cervical four-five, five-six anterior cervical decompression/discectomy fusion with plating   BACK SURGERY     6 from degenerative disease.Marland Kitchen  1 upper neck   HIP SURGERY Bilateral    rods to each hip- hip can ot of socket.   TONSILLECTOMY     Social History   Socioeconomic History   Marital status: Single    Spouse name: Not on file   Number of children: Not on file   Years of education: Not on file   Highest education level: Not on file  Occupational History   Not on file  Tobacco Use   Smoking status: Former    Current packs/day: 0.00    Average packs/day: 1 pack/day for 20.0 years (20.0 ttl pk-yrs)    Types: Cigarettes    Start date: 09/22/1996    Quit date: 09/22/2016    Years since quitting: 6.7   Smokeless tobacco: Never  Vaping Use   Vaping status: Never Used  Substance and Sexual Activity   Alcohol use: Yes    Comment: occassional    Drug use: Yes    Types: Marijuana   Sexual activity: Not on file  Other Topics Concern   Not on file  Social History Narrative   Lives with wife and stepson.    Social Determinants of Health   Financial Resource Strain: Not on file  Food Insecurity: Not on file  Transportation Needs: Not on file  Physical Activity: Not on file  Stress: Not on file  Social Connections: Not on file   Family History  Problem Relation Age of Onset   Diabetes Mellitus II Mother    Allergies   Allergen Reactions   Darvocet [Propoxyphene N-Acetaminophen] Itching and Nausea And Vomiting   Onion Itching   Prior to Admission medications   Medication Sig Start Date End Date Taking? Authorizing Provider  albuterol (PROVENTIL HFA;VENTOLIN HFA) 108 (90 Base) MCG/ACT inhaler Inhale 2 puffs into the lungs every 6 (six) hours as needed for wheezing or shortness of breath.   Yes [provider]  Cholecalciferol (VITAMIN D3) 50 MCG (2000 UT) CAPS Take 2,000 Units by mouth daily.   Yes [provider]  docusate sodium (COLACE) 100 MG capsule Take 1 capsule (100 mg total) by mouth daily as needed. Patient taking differently: Take 100 mg by mouth 2 (two) times daily. 06/05/23 06/04/24 Yes Cristie Hem, PA-C  gabapentin (NEURONTIN) 400 MG capsule Take 400 mg by mouth 2 (two) times daily.   Yes [provider]  metoprolol succinate (TOPROL-XL) 50 MG 24 hr tablet Take 50 mg by mouth daily. Take with or immediately following a meal.   Yes [provider]  psyllium (REGULOID) 0.52 g capsule Take 0.52 g by mouth in the morning and at bedtime. Patient not taking: Reported on 06/08/2023   Yes [provider]  SUBOXONE 4-1 MG FILM Place 1 Film under the tongue in the morning and at  bedtime. 11/22/16  Yes [provider]  aspirin EC 81 MG tablet Take 1 tablet (81 mg total) by mouth 2 (two) times daily. To be taken after surgery to prevent blood clots Patient not taking: Reported on 06/08/2023 06/05/23 06/04/24  Cristie Hem, PA-C  doxycycline (VIBRAMYCIN) 100 MG capsule Take 1 capsule (100 mg total) by mouth 2 (two) times daily. To be taken after surgery 06/05/23   Cristie Hem, PA-C  methocarbamol (ROBAXIN-750) 750 MG tablet Take 1 tablet (750 mg total) by mouth 2 (two) times daily as needed for muscle spasms. 06/05/23   Cristie Hem, PA-C  ondansetron (ZOFRAN) 4 MG tablet Take 1 tablet (4 mg total) by mouth every 8 (eight) hours as needed for  nausea or vomiting. Patient not taking: Reported on 06/08/2023 06/05/23   Cristie Hem, PA-C  oxyCODONE-acetaminophen (PERCOCET) 5-325 MG tablet Take 1-2 tablets by mouth every 6 (six) hours as needed. To be taken after surgery Patient not taking: Reported on 06/08/2023 06/05/23   Cristie Hem, PA-C     Positive ROS: All other systems have been reviewed and were otherwise negative with the exception of those mentioned in the HPI and as above.  Physical Exam: General: Alert, no acute distress Cardiovascular: No pedal edema Respiratory: No cyanosis, no use of accessory musculature GI: abdomen soft Skin: No lesions in the area of chief complaint Neurologic: Sensation intact distally Psychiatric: Patient is competent for consent with normal mood and affect Lymphatic: no lymphedema  MUSCULOSKELETAL: exam stable  Assessment: left hip degenerative joint disease  Plan: Plan for Procedure(s): LEFT TOTAL HIP ARTHROPLASTY ANTERIOR APPROACH HARDWARE REMOVAL  The risks benefits and alternatives were discussed with the patient including but not limited to the risks of nonoperative treatment, versus surgical intervention including infection, bleeding, nerve injury,  blood clots, cardiopulmonary complications, morbidity, mortality, among others, and they were willing to proceed.   Glee Arvin, MD 06/11/2023 11:59 AM

## 2023-06-11 NOTE — Transfer of Care (Signed)
Immediate Anesthesia Transfer of Care Note  Patient: [name]  Procedure(s) Performed: LEFT TOTAL HIP ARTHROPLASTY ANTERIOR APPROACH (Left: Hip) HARDWARE REMOVAL (Left)  Patient Location: PACU  Anesthesia Type:General  Level of Consciousness: awake and patient cooperative  Airway & Oxygen Therapy: Patient Spontanous Breathing and Patient connected to face mask oxygen  Post-op Assessment: Report given to RN and Post -op Vital signs reviewed and stable  Post vital signs: Reviewed and stable  Last Vitals:  Vitals Value Taken Time  BP 119/97 06/11/23 1548  Temp 36.2 C 06/11/23 1548  Pulse 64 06/11/23 1550  Resp 18 06/11/23 1550  SpO2 98 % 06/11/23 1550  Vitals shown include unfiled device data.  Last Pain:  Vitals:   06/11/23 1148  PainSc: 9       Patients Stated Pain Goal: 2 (06/11/23 1148)  Complications: No notable events documented.

## 2023-06-11 NOTE — Plan of Care (Signed)
  Problem: Education: Goal: Knowledge of General Education information will improve Description: Including pain rating scale, medication(s)/side effects and non-pharmacologic comfort measures Outcome: Progressing   Problem: Activity: Goal: Risk for activity intolerance will decrease Outcome: Not Progressing   

## 2023-06-11 NOTE — Anesthesia Preprocedure Evaluation (Addendum)
Anesthesia Evaluation  Patient identified by MRN, date of birth, ID band Patient awake    Reviewed: Allergy & Precautions, NPO status , Patient's Chart, lab work & pertinent test results  Airway Mallampati: II  TM Distance: >3 FB Neck ROM: Full    Dental  (+) Missing, Poor Dentition   Pulmonary COPD,  COPD inhaler, Patient abstained from smoking., former smoker   Pulmonary exam normal        Cardiovascular hypertension, Pt. on home beta blockers Normal cardiovascular exam     Neuro/Psych Seizures -, Well Controlled,   negative psych ROS   GI/Hepatic negative GI ROS,,,(+)     substance abuse    Endo/Other  negative endocrine ROS    Renal/GU negative Renal ROS     Musculoskeletal  (+) Arthritis ,  narcotic dependentLumbar spine surgery x 5   Abdominal   Peds  Hematology negative hematology ROS (+)   Anesthesia Other Findings left hip degenerative joint disease  Reproductive/Obstetrics                             Anesthesia Physical Anesthesia Plan  ASA: 3  Anesthesia Plan: General   Post-op Pain Management: Dilaudid IV, Ketamine IV* and Precedex   Induction: Intravenous  PONV Risk Score and Plan: 3 and Ondansetron, Dexamethasone, Midazolam and Treatment may vary due to age or medical condition  Airway Management Planned: Oral ETT  Additional Equipment:   Intra-op Plan:   Post-operative Plan: Extubation in OR  Informed Consent: I have reviewed the patients History and Physical, chart, labs and discussed the procedure including the risks, benefits and alternatives for the proposed anesthesia with the patient or authorized representative who has indicated his/her understanding and acceptance.     Dental advisory given  Plan Discussed with: CRNA  Anesthesia Plan Comments:        Anesthesia Quick Evaluation

## 2023-06-11 NOTE — Anesthesia Procedure Notes (Addendum)
Procedure Name: Intubation Date/Time: 06/11/2023 12:50 PM  Performed by: Shary Decamp, CRNAPre-anesthesia Checklist: Patient identified, Patient being monitored, Timeout performed, Emergency Drugs available and Suction available Patient Re-evaluated:Patient Re-evaluated prior to induction Oxygen Delivery Method: Circle system utilized Preoxygenation: Pre-oxygenation with 100% oxygen Induction Type: IV induction Ventilation: Mask ventilation without difficulty Laryngoscope Size: Miller and 2 Grade View: Grade II Tube type: Oral Tube size: 7.5 mm Number of attempts: 2 (First attempt by CRNA) Airway Equipment and Method: Stylet Placement Confirmation: ETT inserted through vocal cords under direct vision, positive ETCO2 and breath sounds checked- equal and bilateral Secured at: 21 cm Tube secured with: Tape Dental Injury: Teeth and Oropharynx as per pre-operative assessment

## 2023-06-11 NOTE — Progress Notes (Signed)
PT Cancellation Note  Patient Details Name: TARUS ENTIN MRN: [id number] DOB: 03/29/1966   Cancelled Treatment:    Reason Eval/Treat Not Completed: Pain limiting ability to participate. Pt declines PT evaluation at this time due to pain. Pt also with continued sensation deficits in BLE. PT will follow up tomorrow morning.   Arlyss Gandy 06/11/2023, 5:23 PM

## 2023-06-11 NOTE — Op Note (Signed)
LEFT TOTAL HIP ARTHROPLASTY ANTERIOR APPROACH, HARDWARE REMOVAL  Procedure Note [name]   [id number]  Pre-op Diagnosis: Left hip osteoarthritis with 2 retained knowles pins from prior SCFE     Post-op Diagnosis: same  Operative Findings Complete loss of articular cartilage Synovitis   Operative Procedures  1. Total hip replacement; Left hip; uncemented cpt-27130  2.  Removal of 2 knowles pins from lateral femur through separate incision.  CPT 20680  Surgeon: Gershon Mussel, M.D.  Assist: Oneal Grout, PA-C   Anesthesia: general  Prosthesis: Depuy Acetabulum: Pinnacle 52 mm Femur: Actis 3 HO Head: 36 mm size: +1.5 Liner: +4 Bearing Type: ceramic/poly  Total Hip Arthroplasty (Anterior Approach) Op Note:  After informed consent was obtained and the operative extremity marked in the holding area, the patient was brought back to the operating room and placed supine on the HANA table. Next, the operative extremity was prepped and draped in normal sterile fashion. Surgical timeout occurred verifying patient identification, surgical site, surgical procedure and administration of antibiotics.  The previous lateral hip surgical scar was incised.  Dissection was carried down through the subcutaneous tissue.  The IT band was split in line with the incision.  A Cobb elevator was used to elevate the musculature off of the lateral femoral cortex.  1/4 inch osteotome was used to remove the bony overgrowth over the heads of the 2 Knowles pins. Location was found using fluoroscopy.  A trough was created circumferentially around the heads of the Knowles pins in order to allow the hex T-handle to engage.  Each pin was backed out by hand without any difficulty.  There were no iatrogenic fractures to the lateral cortex from the removal of the pins.  I then turned my attention to the hip replacement.  A separate 10 cm longitudinal incision was made starting from 2 fingerbreadths lateral and  inferior to the ASIS towards the lateral aspect of the patella.  A Hueter approach to the hip was performed, using the interval between tensor fascia lata and sartorius.  Dissection was carried bluntly down onto the anterior hip capsule. The lateral femoral circumflex vessels were identified and coagulated. A capsulotomy was performed and the capsular flaps tagged for later repair.  The neck osteotomy was performed. The femoral head was removed which showed severe wear, the acetabular rim was cleared of soft tissue and osteophytes and attention was turned to reaming the acetabulum.  Sequential reaming was performed under fluoroscopic guidance down to the floor of the cotyloid fossa. We reamed to a size 51 mm, and then impacted the acetabular shell. A 25 mm cancellous screw was placed to secure the shell.  The liner was then placed after irrigation and attention turned to the femur.  After placing the femoral hook, the leg was taken to externally rotated, extended and adducted position taking care to perform soft tissue releases to allow for adequate mobilization of the femur. Soft tissue was cleared from the shoulder of the greater trochanter and the hook elevator used to improve exposure of the proximal femur. Sequential broaching performed up to a size 3. Trial neck and head were placed. The leg was brought back up to neutral and the construct reduced.  The position and sizing of components, offset and leg lengths were checked using fluoroscopy. Stability of the construct was checked in 45 degrees of hip extension and 60 degrees of external rotation without any subluxation, shuck or impingement of prosthesis. We dislocated the prosthesis, dropped the leg  back into position, removed trial components, and irrigated copiously. The final stem and head was then placed, the leg brought back up, the system reduced and fluoroscopy used to verify positioning.  Antibiotic irrigation was placed in the surgical wound.   We  irrigated, obtained hemostasis and closed the capsule using #2 ethibond suture.  A topical mixture of 0.25% bupivacaine and meloxicam was placed deep to the fascia.  One gram of vancomycin powder was placed in the surgical bed.   One gram of topical tranexamic acid was injected into the joint.  The fascia was closed with #1 vicryl plus, the deep fat layer was closed with 0 vicryl, the subcutaneous layers closed with 2.0 Vicryl Plus and the skin closed with 2-0 nylon and Dermabond.  A sterile dressing was applied.  The lateral incision was also closed in a layered fashion using #1 Vicryl for the IT band, 0 Vicryl for the subcutaneous fat, 2-0 Vicryl for the subcuticular layer, 2-0 nylon and Dermabond for the skin.  A sterile dressing was applied.  The patient was awakened in the operating room and taken to recovery in stable condition.  All sponge, needle, and instrument counts were correct at the end of the case.   [name], my PA, was a medical necessity for opening, closing, limb positioning, retracting, exposing, and overall facilitation and timely completion of the surgery.  Position: supine  Complications: see description of procedure.  Time Out: performed   Drains/Packing: none  Estimated blood loss: see anesthesia record  Returned to Recovery Room: in good condition.   Antibiotics: yes   Mechanical VTE (DVT) Prophylaxis: sequential compression devices, TED thigh-high  Chemical VTE (DVT) Prophylaxis: Aspirin  Fluid Replacement: see anesthesia record  Specimens Removed: 1 to pathology   Sponge and Instrument Count Correct? yes   PACU: portable radiograph - low AP   Plan/RTC: Return in 2 weeks for staple removal. Weight Bearing/Load Lower Extremity: 50% partial weightbearing Hip precautions: none Suture Removal: 2 weeks   N. Glee Arvin, MD Aldean Baker 2:55 PM   Implant Name Type Inv. Item Serial No. Manufacturer Lot No. LRB No. Used Action  PIN SECTOR  W/GRIP ACE CUP - [id number] Hips PIN SECTOR W/GRIP ACE CUP  [employer] [id number] Left 1 Implanted  LINER NEUTRAL 52X36MM PLUS 4 - [id number] Liner LINER NEUTRAL 52X36MM PLUS 4  [employer]PU[employer] [id number] Left 1 Implanted  SCREW 6.5MMX25MM - [id number] Screw SCREW 6.5MMX25MM  [employer]PU[employer] [id number] Left 1 Implanted  STEM FEM ACTIS HIGH SZ3 - [id number] Stem STEM FEM ACTIS HIGH SZ3  DEPU[employer] [id number] Left 1 Implanted  HEAD CERAMIC DELTA 36 PLUS 1.5 - [id number] Hips HEAD CERAMIC DELTA 36 PLUS 1.5  [employer] [id number] Left 1 Implanted

## 2023-06-11 NOTE — Progress Notes (Signed)
[facility] made aware of time change and the need for the patient to be picked up ASAP.  They are sending someone now to pick up patient.  Patient returned call and made aware the need to take his last shower and [facility] will be there in 15-20 minutes.  Patient verbalized understanding.

## 2023-06-11 NOTE — Progress Notes (Signed)
Applied Ice man to left hip

## 2023-06-12 ENCOUNTER — Encounter (HOSPITAL_COMMUNITY): Payer: Self-pay | Admitting: Orthopaedic Surgery

## 2023-06-12 NOTE — TOC Initial Note (Addendum)
Transition of Care [facility]) - Initial/Assessment Note    Patient Details  Name: [name] MRN: [id number] Date of Birth: [birth year]  [facility] CM/SW Contact:    Epifanio Lesches, RN Phone Number: 06/12/2023, 1:50 PM  Clinical Narrative:          - s/p LEFT TOTAL HIP ARTHROPLASTY     NCM spoke with pt regarding d/c planning. States from home with wife [name] and stepson [name]. Pt states he's wife  primary caregiver and she was recently placed in SNF/ rehab. 2/2 him having surgery. States wife has MS. States step son works out of town and will be gone until Sat. States once  home he can help assist with care if needed. Pt active with [facility], [phone] . NCM to f/u with SW @ [facility] regarding therapy recommendation.Marland KitchenMarland KitchenMarland KitchenOrder noted for HHPT and DME needs.  TOC  team following and will assist with needs...  [facility] - Mamie Levers SW, [phone]  Son  [name] [phone]  FAX D/C SUMMARY AND HOME HEALTH ORDERS TO [phone].   Expected Discharge Plan: Home w Home Health Services Barriers to Discharge: Continued Medical Work up   Patient Goals and CMS Choice            Expected Discharge Plan and Services                                              Prior Living Arrangements/Services   Lives with:: Significant Other   Do you feel safe going back to the place where you live?: Yes          Current home services: DME (cane)    Activities of Daily Living      Permission Sought/Granted                  Emotional Assessment       Orientation: : Oriented to Self, Oriented to Place, Oriented to  Time, Oriented to Situation Alcohol / Substance Use: Tobacco Use (Smokes a pk  q 2 days 40+ years. States [facility] is helping with smoking cessation) Psych Involvement: No (comment)  Admission diagnosis:  Status post total hip replacement, left [Z96.642] Patient Active Problem List   Diagnosis  Date Noted   Status post total hip replacement, left 06/11/2023   Cardiac arrhythmia 06/08/2023   Post-traumatic osteoarthritis of left hip 01/16/2023   Chronic midline low back pain 01/16/2023   Hematoma of left flank    Coagulopathy (HCC) 10/25/2016   Essential hypertension 10/25/2016   Diabetes mellitus type 2, controlled (HCC) 10/25/2016   Malnutrition of moderate degree 10/25/2016   Elevated partial thromboplastin time (PTT)    Elevated protime    Gastrointestinal hemorrhage    Hematuria    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    Hematemesis 10/24/2016   PCP:  [facility]:   [facility] [address] [place] - [address] [place] [address] Phone: [phone] Fax: [phone]  [facility] [place], [place] - [address] [place] [address] Phone: [phone] Fax: [phone]  [id number] [place], [place] - [address] AT [facility] [address] [place] [address] Phone: [phone] Fax: [phone]  [place] [facility] [address] [place] [address] Phone: [phone] Fax: [phone]  [facility] [address] [place], [place] - [address] [place] [address] Phone: [phone] Fax: [phone]     Social Determinants of Health (SDOH) Social History: SDOH Screenings   Food Insecurity: No Food Insecurity (06/11/2023)  Transportation Needs: No Transportation Needs (06/11/2023)  Utilities: Not At Risk (06/11/2023)  Tobacco Use: Medium Risk (06/11/2023)   SDOH Interventions:     Readmission Risk Interventions     No data to display

## 2023-06-12 NOTE — Anesthesia Postprocedure Evaluation (Signed)
Anesthesia Post Note  Patient: [name]  Procedure(s) Performed: LEFT TOTAL HIP ARTHROPLASTY ANTERIOR APPROACH (Left: Hip) HARDWARE REMOVAL (Left)     Patient location during evaluation: PACU Anesthesia Type: General Level of consciousness: awake Pain management: pain level controlled Vital Signs Assessment: post-procedure vital signs reviewed and stable Respiratory status: spontaneous breathing, nonlabored ventilation and respiratory function stable Cardiovascular status: blood pressure returned to baseline and stable Postop Assessment: no apparent nausea or vomiting Anesthetic complications: no   No notable events documented.  Last Vitals:  Vitals:   06/12/23 0523 06/12/23 0744  BP: 128/82 119/85  Pulse: (!) 58 60  Resp: 17 18  Temp: 37 C 37.3 C  SpO2: 99% 99%    Last Pain:  Vitals:   06/12/23 1739  TempSrc:   PainSc: 9                  Kaija Kovacevic P Dudley Mages

## 2023-06-12 NOTE — Progress Notes (Signed)
Angela from [facility] called this morning. She can arrange for the patient to be picked up when he's discharged. Her number is [phone]

## 2023-06-12 NOTE — Progress Notes (Signed)
Subjective: 1 Day Post-Op Procedure(s) (LRB): LEFT TOTAL HIP ARTHROPLASTY ANTERIOR APPROACH (Left) HARDWARE REMOVAL (Left) Patient reports pain as mild.    Objective: Vital signs in last 24 hours: Temp:  [97.1 F (36.2 C)-99.2 F (37.3 C)] 99.2 F (37.3 C) (12/10 0744) Pulse Rate:  [56-73] 60 (12/10 0744) Resp:  [14-24] 18 (12/10 0744) BP: (110-161)/(58-99) 119/85 (12/10 0744) SpO2:  [93 %-100 %] 99 % (12/10 0744) Weight:  [83 kg] 83 kg (12/09 1133)  Intake/Output from previous day: 12/09 0701 - 12/10 0700 In: 1550 [I.V.:900; IV Piggyback:650] Out: 1350 [Urine:1050; Blood:300] Intake/Output this shift: No intake/output data recorded.  No results for input(s): "HGB" in the last 72 hours. No results for input(s): "WBC", "RBC", "HCT", "PLT" in the last 72 hours. No results for input(s): "NA", "K", "CL", "CO2", "BUN", "CREATININE", "GLUCOSE", "CALCIUM" in the last 72 hours. No results for input(s): "LABPT", "INR" in the last 72 hours.  Neurologically intact Neurovascular intact Sensation intact distally Intact pulses distally Dorsiflexion/Plantar flexion intact Incision: dressing C/D/I No cellulitis present   Assessment/Plan: 1 Day Post-Op Procedure(s) (LRB): LEFT TOTAL HIP ARTHROPLASTY ANTERIOR APPROACH (Left) HARDWARE REMOVAL (Left) Advance diet Up with therapy D/C IV fluids 50 % WBAT LLE D/c home once cleared by PT.  Lives alone.  Will likely need to stay another night or two.        Cristie Hem 06/12/2023, 8:06 AM

## 2023-06-12 NOTE — Plan of Care (Signed)
  Problem: Clinical Measurements: Goal: Ability to maintain clinical measurements within normal limits will improve Outcome: Progressing Goal: Will remain free from infection Outcome: Progressing   

## 2023-06-12 NOTE — Progress Notes (Signed)
Patient's PA from [facility], [employer], called to check on patient. She also wanted me to pass along to the doctor that patient has been taking 4-1 mg of suboxone BID since 2022 and that he needs to continue this dose. She said she is available by phone at [phone].

## 2023-06-12 NOTE — Progress Notes (Signed)
Physical Therapy Treatment Patient Details Name: [name] MRN: [id number] DOB: 09-28-1965 Today's Date: 06/12/2023   History of Present Illness 57 y.o. male presents to [facility] hospital on 06/11/2023 for elective L THA and removal of 2 Knowles pins. PMH includes HTN, coagulopathy, DMII, SCFE, chronic low back pain.    PT Comments  Pt seen for second session for gait advancement. Pt ambulating 30 ft with a walker, utilizing a step to pattern. Needs continued cues/instruction for weightbearing precautions. Will continue emphasis on strengthening, transfer/gait training, and functional mobility.    If plan is discharge home, recommend the following: A little help with walking and/or transfers;A little help with bathing/dressing/bathroom;Assistance with cooking/housework;Assist for transportation;Help with stairs or ramp for entrance   Can travel by private vehicle        Equipment Recommendations  Rolling walker (2 wheels);BSC/3in1    Recommendations for Other Services       Precautions / Restrictions Precautions Precautions: Fall Restrictions Weight Bearing Restrictions: Yes LLE Weight Bearing: Partial weight bearing LLE Partial Weight Bearing Percentage or Pounds: 50     Mobility  Bed Mobility Overal bed mobility: Needs Assistance Bed Mobility: Supine to Sit, Sit to Supine     Supine to sit: Min assist Sit to supine: Min assist   General bed mobility comments: Gait belt strap provided to maneuver on and off bed; assist for use    Transfers Overall transfer level: Needs assistance Equipment used: Rolling walker (2 wheels) Transfers: Sit to/from Stand Sit to Stand: Contact guard assist           General transfer comment: Cues for placing LLE anteriorly to prevent full weightbearing    Ambulation/Gait Ambulation/Gait assistance: Contact guard assist Gait Distance (Feet): 30 Feet Assistive device: Rolling walker (2 wheels) Gait Pattern/deviations: Step-to  pattern, Decreased step length - right, Decreased stance time - left, Decreased dorsiflexion - left Gait velocity: decreased Gait velocity interpretation: <1.8 ft/sec, indicate of risk for recurrent falls   General Gait Details: Verbal cues for weightbearing precautions, sequencing/technique, walker use   Stairs             Wheelchair Mobility     Tilt Bed    Modified Rankin (Stroke Patients Only)       Balance Overall balance assessment: Needs assistance Sitting-balance support: Feet supported Sitting balance-Leahy Scale: Good     Standing balance support: Bilateral upper extremity supported Standing balance-Leahy Scale: Poor Standing balance comment: reliant on RW                            Cognition Arousal: Alert Behavior During Therapy: WFL for tasks assessed/performed Overall Cognitive Status: Within Functional Limits for tasks assessed                                          Exercises Total Joint Exercises Ankle Circles/Pumps: Left, 10 reps, Seated Quad Sets: Left, 10 reps, Seated Long Arc Quad: Left, Seated, 5 reps, AAROM    General Comments        Pertinent Vitals/Pain Pain Assessment Pain Assessment: Faces Pain Score: 10-Worst pain ever Faces Pain Scale: Hurts even more Pain Location: L hip Pain Descriptors / Indicators: Discomfort, Operative site guarding Pain Intervention(s): Monitored during session, Limited activity within patient's tolerance, Repositioned, Ice applied    Home Living Family/patient expects to be discharged to::  Private residence Living Arrangements: Spouse/significant other Available Help at Discharge: Family Type of Home: House Home Access: Stairs to enter Entrance Stairs-Rails: None Entrance Stairs-Number of Steps: 3   Home Layout: One level Home Equipment: Cane - single point Additional Comments: Pt spouse has MS and is at [facility]. Pt son out of town 2 weeks. Pt mom had recent knee  surgery and pt dad is blind. Pt stepson's "car is messed up." Pt neighbor can provide potential transportation    Prior Function            PT Goals (current goals can now be found in the care plan section) Acute Rehab PT Goals Patient Stated Goal: go home PT Goal Formulation: With patient Time For Goal Achievement: 06/26/23 Potential to Achieve Goals: Good Progress towards PT goals: Progressing toward goals    Frequency    7X/week      PT Plan      Co-evaluation              AM-PAC PT "6 Clicks" Mobility   Outcome Measure  Help needed turning from your back to your side while in a flat bed without using bedrails?: None Help needed moving from lying on your back to sitting on the side of a flat bed without using bedrails?: A Little Help needed moving to and from a bed to a chair (including a wheelchair)?: A Little Help needed standing up from a chair using your arms (e.g., wheelchair or bedside chair)?: A Little Help needed to walk in hospital room?: A Little Help needed climbing 3-5 steps with a railing? : Total 6 Click Score: 17    End of Session Equipment Utilized During Treatment: Gait belt Activity Tolerance: Patient tolerated treatment well Patient left: with call bell/phone within reach;in bed Nurse Communication: Mobility status PT Visit Diagnosis: Unsteadiness on feet (R26.81);Other abnormalities of gait and mobility (R26.89);Difficulty in walking, not elsewhere classified (R26.2);Pain Pain - Right/Left: Left Pain - part of body: Hip     Time: 1610-9604 PT Time Calculation (min) (ACUTE ONLY): 21 min  Charges:    $Gait Training: 8-22 mins $Therapeutic Activity: 23-37 mins PT General Charges $$ ACUTE PT VISIT: 1 Visit                     Lillia Pauls, PT, DPT Acute Rehabilitation Services Office [phone]    Norval Morton 06/12/2023, 4:46 PM

## 2023-06-12 NOTE — Evaluation (Signed)
Physical Therapy Evaluation Patient Details Name: [name] MRN: [id number] DOB: [birth year] Today's Date: 06/12/2023  History of Present Illness  57 y.o. male presents to [facility] hospital on 06/11/2023 for elective L THA and removal of 2 Knowles pins. PMH includes HTN, coagulopathy, DMII, SCFE, chronic low back pain.  Clinical Impression  PTA, pt lives with his spouse, is independent and has medical transportation through [facility]. Pt reports [facility] placed his spouse who has MS in SNF prior to his surgery. Pt presents with decreased functional mobility secondary to pain, LLE weakness, gait abnormalities, weightbearing precautions. On PT evaluation, pt ambulating ~5 ft with walker, utilizing a step to pattern. Visual and verbal instruction provided for 50% PWB through LLE. Pt able to perform seated exercises for strengthening. Will continue to progress as tolerated. Discussed with Tammy Sours, CSW, who stated [facility] can provide [facility] aide upon d/c home. Would also benefit from HHPT.        If plan is discharge home, recommend the following: A little help with walking and/or transfers;A little help with bathing/dressing/bathroom;Assistance with cooking/housework;Assist for transportation;Help with stairs or ramp for entrance   Can travel by private vehicle        Equipment Recommendations Rolling walker (2 wheels);BSC/3in1  Recommendations for Other Services       Functional Status Assessment Patient has had a recent decline in their functional status and demonstrates the ability to make significant improvements in function in a reasonable and predictable amount of time.     Precautions / Restrictions Precautions Precautions: Fall Restrictions Weight Bearing Restrictions: Yes LLE Weight Bearing: Partial weight bearing LLE Partial Weight Bearing Percentage or Pounds: 50      Mobility  Bed Mobility Overal bed mobility: Needs Assistance Bed Mobility: Supine to Sit     Supine to sit: Min assist      General bed mobility comments: Assist for LLE negotiation out of bed    Transfers Overall transfer level: Needs assistance Equipment used: Rolling walker (2 wheels) Transfers: Sit to/from Stand Sit to Stand: Contact guard assist           General transfer comment: Cues for placing LLE anteriorly to prevent full weightbearing    Ambulation/Gait Ambulation/Gait assistance: Contact guard assist Gait Distance (Feet): 5 Feet Assistive device: Rolling walker (2 wheels) Gait Pattern/deviations: Step-to pattern, Decreased step length - right, Decreased stance time - left, Decreased dorsiflexion - left Gait velocity: decreased Gait velocity interpretation: <1.31 ft/sec, indicative of household ambulator   General Gait Details: Verbal cues for weightbearing precautions, sequencing/technique, walker use  Stairs            Wheelchair Mobility     Tilt Bed    Modified Rankin (Stroke Patients Only)       Balance Overall balance assessment: Needs assistance Sitting-balance support: Feet supported Sitting balance-Leahy Scale: Good     Standing balance support: Bilateral upper extremity supported Standing balance-Leahy Scale: Poor Standing balance comment: reliant on RW                             Pertinent Vitals/Pain Pain Assessment Pain Assessment: 0-10 Pain Score: 10-Worst pain ever Pain Location: L hip Pain Descriptors / Indicators: Discomfort, Operative site guarding Pain Intervention(s): Limited activity within patient's tolerance, Monitored during session, Patient requesting pain meds-RN notified, Premedicated before session    Home Living Family/patient expects to be discharged to:: Private residence Living Arrangements: Spouse/significant other Available Help at Discharge: Family Type  of Home: House Home Access: Stairs to enter Entrance Stairs-Rails: None Entrance Stairs-Number of Steps: 3   Home Layout: One level Home Equipment: Cane -  single point Additional Comments: Pt spouse has MS and is at [facility]. Pt son out of town 2 weeks. Pt mom had recent knee surgery and pt dad is blind. Pt stepson's "car is messed up." Pt neighbor can provide potential transportation    Prior Function Prior Level of Function : Independent/Modified Independent             Mobility Comments: [facility] provides transportation to appointments, delivers medication, using cane prior to surgery. ADLs Comments: independent     Extremity/Trunk Assessment   Upper Extremity Assessment Upper Extremity Assessment: Overall WFL for tasks assessed    Lower Extremity Assessment Lower Extremity Assessment: LLE deficits/detail LLE Deficits / Details: Grossly weak s/p THA. Unable to perform LAQ, able to perform quad set    Cervical / Trunk Assessment Cervical / Trunk Assessment: Normal  Communication   Communication Communication: No apparent difficulties  Cognition Arousal: Alert Behavior During Therapy: WFL for tasks assessed/performed Overall Cognitive Status: Within Functional Limits for tasks assessed                                          General Comments      Exercises Total Joint Exercises Ankle Circles/Pumps: Left, 10 reps, Seated Quad Sets: Left, 10 reps, Seated Long Arc Quad: Left, Seated, 5 reps, AAROM   Assessment/Plan    PT Assessment Patient needs continued PT services  PT Problem List Decreased strength;Decreased activity tolerance;Decreased balance;Decreased mobility;Pain       PT Treatment Interventions DME instruction;Gait training;Stair training;Functional mobility training;Therapeutic activities;Therapeutic exercise;Balance training;Patient/family education    PT Goals (Current goals can be found in the Care Plan section)  Acute Rehab PT Goals Patient Stated Goal: go home PT Goal Formulation: With patient Time For Goal Achievement: 06/26/23 Potential to Achieve Goals: Good    Frequency 7X/week      Co-evaluation               AM-PAC PT "6 Clicks" Mobility  Outcome Measure Help needed turning from your back to your side while in a flat bed without using bedrails?: None Help needed moving from lying on your back to sitting on the side of a flat bed without using bedrails?: A Little Help needed moving to and from a bed to a chair (including a wheelchair)?: A Little Help needed standing up from a chair using your arms (e.g., wheelchair or bedside chair)?: A Little Help needed to walk in hospital room?: A Little Help needed climbing 3-5 steps with a railing? : Total 6 Click Score: 17    End of Session Equipment Utilized During Treatment: Gait belt Activity Tolerance: Patient tolerated treatment well Patient left: in chair;with call bell/phone within reach;with chair alarm set Nurse Communication: Mobility status PT Visit Diagnosis: Unsteadiness on feet (R26.81);Other abnormalities of gait and mobility (R26.89);Difficulty in walking, not elsewhere classified (R26.2);Pain Pain - Right/Left: Left Pain - part of body: Hip    Time: 1027-2536 PT Time Calculation (min) (ACUTE ONLY): 40 min   Charges:   PT Evaluation $PT Eval Low Complexity: 1 Low PT Treatments $Therapeutic Activity: 23-37 mins PT General Charges $$ ACUTE PT VISIT: 1 Visit         Lillia Pauls, PT, DPT Acute Rehabilitation Services Office  [phone]   Norval Morton 06/12/2023, 3:57 PM

## 2023-06-13 DIAGNOSIS — R569 Unspecified convulsions: Secondary | ICD-10-CM | POA: Diagnosis present

## 2023-06-13 DIAGNOSIS — M47816 Spondylosis without myelopathy or radiculopathy, lumbar region: Secondary | ICD-10-CM | POA: Diagnosis present

## 2023-06-13 DIAGNOSIS — K0889 Other specified disorders of teeth and supporting structures: Secondary | ICD-10-CM | POA: Diagnosis present

## 2023-06-13 DIAGNOSIS — Z9889 Other specified postprocedural states: Secondary | ICD-10-CM | POA: Diagnosis not present

## 2023-06-13 DIAGNOSIS — Z602 Problems related to living alone: Secondary | ICD-10-CM | POA: Diagnosis present

## 2023-06-13 DIAGNOSIS — Z87891 Personal history of nicotine dependence: Secondary | ICD-10-CM | POA: Diagnosis not present

## 2023-06-13 DIAGNOSIS — Z8709 Personal history of other diseases of the respiratory system: Secondary | ICD-10-CM | POA: Diagnosis not present

## 2023-06-13 DIAGNOSIS — Z862 Personal history of diseases of the blood and blood-forming organs and certain disorders involving the immune mechanism: Secondary | ICD-10-CM | POA: Diagnosis not present

## 2023-06-13 DIAGNOSIS — Z7982 Long term (current) use of aspirin: Secondary | ICD-10-CM | POA: Diagnosis not present

## 2023-06-13 DIAGNOSIS — Z79899 Other long term (current) drug therapy: Secondary | ICD-10-CM | POA: Diagnosis not present

## 2023-06-13 DIAGNOSIS — Z967 Presence of other bone and tendon implants: Secondary | ICD-10-CM | POA: Diagnosis present

## 2023-06-13 DIAGNOSIS — Z885 Allergy status to narcotic agent status: Secondary | ICD-10-CM | POA: Diagnosis not present

## 2023-06-13 DIAGNOSIS — Z96642 Presence of left artificial hip joint: Secondary | ICD-10-CM | POA: Diagnosis present

## 2023-06-13 DIAGNOSIS — Z91018 Allergy to other foods: Secondary | ICD-10-CM | POA: Diagnosis not present

## 2023-06-13 DIAGNOSIS — Z981 Arthrodesis status: Secondary | ICD-10-CM | POA: Diagnosis not present

## 2023-06-13 DIAGNOSIS — L930 Discoid lupus erythematosus: Secondary | ICD-10-CM | POA: Diagnosis present

## 2023-06-13 DIAGNOSIS — J449 Chronic obstructive pulmonary disease, unspecified: Secondary | ICD-10-CM | POA: Diagnosis present

## 2023-06-13 DIAGNOSIS — Z8719 Personal history of other diseases of the digestive system: Secondary | ICD-10-CM | POA: Diagnosis not present

## 2023-06-13 DIAGNOSIS — M1612 Unilateral primary osteoarthritis, left hip: Secondary | ICD-10-CM | POA: Diagnosis present

## 2023-06-13 DIAGNOSIS — M65952 Unspecified synovitis and tenosynovitis, left thigh: Secondary | ICD-10-CM | POA: Diagnosis present

## 2023-06-13 DIAGNOSIS — Z9089 Acquired absence of other organs: Secondary | ICD-10-CM | POA: Diagnosis not present

## 2023-06-13 DIAGNOSIS — I10 Essential (primary) hypertension: Secondary | ICD-10-CM | POA: Diagnosis present

## 2023-06-13 DIAGNOSIS — Z833 Family history of diabetes mellitus: Secondary | ICD-10-CM | POA: Diagnosis not present

## 2023-06-13 MED ORDER — DOXYCYCLINE HYCLATE 100 MG PO CAPS
100.0000 mg | ORAL_CAPSULE | Freq: Two times a day (BID) | ORAL | 0 refills | Status: DC
Start: 2023-06-13 — End: 2024-04-02

## 2023-06-13 MED ORDER — ASPIRIN 81 MG PO TBEC: 81.0000 mg | DELAYED_RELEASE_TABLET | Freq: Two times a day (BID) | ORAL | 0 refills | Status: AC

## 2023-06-13 MED ORDER — OXYCODONE-ACETAMINOPHEN 5-325 MG PO TABS: 1.0000 | ORAL_TABLET | Freq: Four times a day (QID) | ORAL | 0 refills | Status: DC | PRN

## 2023-06-13 NOTE — Progress Notes (Signed)
    Durable Medical Equipment  (From admission, onward)           Start     Ordered   06/13/23 1536  DME Bedside commode  Once       Comments: Confine to one room  Question:  Patient needs a bedside commode to treat with the following condition  Answer:  History of hip replacement   06/13/23 1537   06/11/23 1705  DME Walker rolling  Once       Question:  Patient needs a walker to treat with the following condition  Answer:  History of hip replacement   06/11/23 1704   06/11/23 1705  DME 3 n 1  Once        06/11/23 1704

## 2023-06-13 NOTE — Progress Notes (Signed)
Physical Therapy Treatment Patient Details Name: [name] MRN: [id number] DOB: January 27, 1966 Today's Date: 06/13/2023   History of Present Illness 57 y.o. male presents to [facility] hospital on 06/11/2023 for elective L THA and removal of 2 Knowles pins. PMH includes HTN, coagulopathy, DMII, SCFE, chronic low back pain.    PT Comments  Pt progressing steadily towards his physical therapy goals. Ambulating 80 ft with a walker, with cues for weightbearing precautions and sequencing. Able to complete bed mobility without physical assist with use of gait belt to elevate left lower extremity back into bed. Goal is to return home with help from his stepson who will be present on Saturday and [facility] services from [facility].    If plan is discharge home, recommend the following: A little help with walking and/or transfers;A little help with bathing/dressing/bathroom;Assistance with cooking/housework;Assist for transportation;Help with stairs or ramp for entrance   Can travel by private vehicle        Equipment Recommendations  Rolling walker (2 wheels);BSC/3in1    Recommendations for Other Services       Precautions / Restrictions Precautions Precautions: Fall Restrictions Weight Bearing Restrictions: Yes LLE Weight Bearing: Partial weight bearing LLE Partial Weight Bearing Percentage or Pounds: 50     Mobility  Bed Mobility Overal bed mobility: Needs Assistance Bed Mobility: Sit to Supine       Sit to supine: Contact guard assist   General bed mobility comments: Use of gait belt and instruction on hooking opposite leg underneath to help elevate back into bed. Increased time/effort    Transfers Overall transfer level: Needs assistance Equipment used: Rolling walker (2 wheels) Transfers: Sit to/from Stand Sit to Stand: Contact guard assist           General transfer comment: Cues for placing LLE anteriorly to prevent full weightbearing    Ambulation/Gait Ambulation/Gait assistance:  Contact guard assist Gait Distance (Feet): 80 Feet Assistive device: Rolling walker (2 wheels) Gait Pattern/deviations: Step-to pattern, Decreased step length - right, Decreased stance time - left, Decreased dorsiflexion - left, Trunk flexed Gait velocity: decreased Gait velocity interpretation: <1.8 ft/sec, indicate of risk for recurrent falls   General Gait Details: Verbal cues for weightbearing precautions, sequencing/technique, walker use, erect posture   Stairs             Wheelchair Mobility     Tilt Bed    Modified Rankin (Stroke Patients Only)       Balance Overall balance assessment: Needs assistance Sitting-balance support: Feet supported Sitting balance-Leahy Scale: Good     Standing balance support: Bilateral upper extremity supported Standing balance-Leahy Scale: Poor Standing balance comment: reliant on RW                            Cognition Arousal: Alert Behavior During Therapy: WFL for tasks assessed/performed Overall Cognitive Status: Within Functional Limits for tasks assessed                                          Exercises Total Joint Exercises Long Arc Quad: Seated, Both, 10 reps    General Comments        Pertinent Vitals/Pain Pain Assessment Pain Assessment: Faces Faces Pain Scale: Hurts even more Pain Location: L hip Pain Descriptors / Indicators: Discomfort, Operative site guarding Pain Intervention(s): Limited activity within patient's tolerance, Monitored during session,  Premedicated before session    Home Living Family/patient expects to be discharged to:: Private residence Living Arrangements: Spouse/significant other Available Help at Discharge: Family Type of Home: House Home Access: Stairs to enter Entrance Stairs-Rails: None Entrance Stairs-Number of Steps: 3   Home Layout: One level Home Equipment: Cane - single point Additional Comments: Pt spouse has MS and is at [facility]. Pt son  out of town 2 weeks. Pt mom had recent knee surgery and pt dad is blind. Pt stepson's "car is messed up." Pt neighbor can provide potential transportation    Prior Function            PT Goals (current goals can now be found in the care plan section) Acute Rehab PT Goals Potential to Achieve Goals: Good Progress towards PT goals: Progressing toward goals    Frequency    7X/week      PT Plan      Co-evaluation              AM-PAC PT "6 Clicks" Mobility   Outcome Measure  Help needed turning from your back to your side while in a flat bed without using bedrails?: None Help needed moving from lying on your back to sitting on the side of a flat bed without using bedrails?: A Little Help needed moving to and from a bed to a chair (including a wheelchair)?: A Little Help needed standing up from a chair using your arms (e.g., wheelchair or bedside chair)?: A Little Help needed to walk in hospital room?: A Little Help needed climbing 3-5 steps with a railing? : A Lot 6 Click Score: 18    End of Session Equipment Utilized During Treatment: Gait belt Activity Tolerance: Patient tolerated treatment well Patient left: in bed;with call bell/phone within reach;with bed alarm set Nurse Communication: Mobility status PT Visit Diagnosis: Unsteadiness on feet (R26.81);Other abnormalities of gait and mobility (R26.89);Difficulty in walking, not elsewhere classified (R26.2);Pain Pain - Right/Left: Left Pain - part of body: Hip     Time: 1122-1150 PT Time Calculation (min) (ACUTE ONLY): 28 min  Charges:    $Gait Training: 8-22 mins $Therapeutic Activity: 8-22 mins PT General Charges $$ ACUTE PT VISIT: 1 Visit                     Lillia Pauls, PT, DPT Acute Rehabilitation Services Office [phone]    Norval Morton 06/13/2023, 1:52 PM

## 2023-06-13 NOTE — Progress Notes (Signed)
Subjective: 2 Days Post-Op Procedure(s) (LRB): LEFT TOTAL HIP ARTHROPLASTY ANTERIOR APPROACH (Left) HARDWARE REMOVAL (Left) Patient reports pain as mild.    Objective: Vital signs in last 24 hours: Temp:  [98.6 F (37 C)-99.2 F (37.3 C)] 99.2 F (37.3 C) (12/11 0505) Pulse Rate:  [64-67] 67 (12/11 0505) Resp:  [19] 19 (12/10 2155) BP: (132-136)/(81-88) 132/81 (12/11 0505) SpO2:  [97 %-99 %] 97 % (12/11 0505)  Intake/Output from previous day: 12/10 0701 - 12/11 0700 In: -  Out: 250 [Urine:250] Intake/Output this shift: No intake/output data recorded.  No results for input(s): "HGB" in the last 72 hours. No results for input(s): "WBC", "RBC", "HCT", "PLT" in the last 72 hours. No results for input(s): "NA", "K", "CL", "CO2", "BUN", "CREATININE", "GLUCOSE", "CALCIUM" in the last 72 hours. No results for input(s): "LABPT", "INR" in the last 72 hours.  Neurologically intact Neurovascular intact Sensation intact distally Intact pulses distally Dorsiflexion/Plantar flexion intact Incision: scant drainage No cellulitis present Compartment soft   Assessment/Plan: 2 Days Post-Op Procedure(s) (LRB): LEFT TOTAL HIP ARTHROPLASTY ANTERIOR APPROACH (Left) HARDWARE REMOVAL (Left) Advance diet Up with therapy D/C IV fluids 50 % WBAT LLE D/c dispo- snf vs home with hhpt.  Patient lives alone and is 50% weight bearing.  I think he would benefit from a short stay at a SNF.     Cristie Hem 06/13/2023, 7:52 AM

## 2023-06-13 NOTE — Progress Notes (Signed)
Physical Therapy Treatment Patient Details Name: [name] MRN: [id number] DOB: Dec 12, 1965 Today's Date: 06/13/2023   History of Present Illness 57 y.o. male presents to [facility] hospital on 06/11/2023 for elective L THA and removal of 2 Knowles pins. PMH includes HTN, coagulopathy, DMII, SCFE, chronic low back pain.    PT Comments  Pt agreeable to PM session and premedicated. Reviewed and performed supine HEP for LLE strengthening and ROM (written handout previously provided). Pt ambulating 60 ft with a walker, utilizing a step to pattern. Able to maneuver in and out of bed with gait belt strap for LLE and increased time. Pt goal is to go home at d/c.    If plan is discharge home, recommend the following: A little help with walking and/or transfers;A little help with bathing/dressing/bathroom;Assistance with cooking/housework;Assist for transportation;Help with stairs or ramp for entrance   Can travel by private vehicle        Equipment Recommendations  Rolling walker (2 wheels);BSC/3in1    Recommendations for Other Services       Precautions / Restrictions Precautions Precautions: Fall Restrictions Weight Bearing Restrictions: Yes LLE Weight Bearing: Partial weight bearing LLE Partial Weight Bearing Percentage or Pounds: 50     Mobility  Bed Mobility Overal bed mobility: Needs Assistance Bed Mobility: Sit to Supine, Supine to Sit     Supine to sit: Contact guard Sit to supine: Contact guard assist   General bed mobility comments: Use of gait belt and instruction on hooking opposite leg underneath to help elevate back into bed. Increased time/effort    Transfers Overall transfer level: Needs assistance Equipment used: Rolling walker (2 wheels) Transfers: Sit to/from Stand Sit to Stand: Supervision           General transfer comment: Cues for placing LLE anteriorly to prevent full weightbearing. increased time to rise but no physical assist required     Ambulation/Gait Ambulation/Gait assistance: Contact guard assist Gait Distance (Feet): 60 Feet Assistive device: Rolling walker (2 wheels) Gait Pattern/deviations: Step-to pattern, Decreased step length - right, Decreased stance time - left, Decreased dorsiflexion - left, Trunk flexed Gait velocity: decreased Gait velocity interpretation: <1.8 ft/sec, indicate of risk for recurrent falls   General Gait Details: Verbal cues for weightbearing precautions, sequencing/technique, walker use, erect posture   Stairs             Wheelchair Mobility     Tilt Bed    Modified Rankin (Stroke Patients Only)       Balance Overall balance assessment: Needs assistance Sitting-balance support: Feet supported Sitting balance-Leahy Scale: Good     Standing balance support: Bilateral upper extremity supported Standing balance-Leahy Scale: Poor Standing balance comment: reliant on RW                            Cognition Arousal: Alert Behavior During Therapy: WFL for tasks assessed/performed Overall Cognitive Status: Within Functional Limits for tasks assessed                                          Exercises Total Joint Exercises Quad Sets: Left, 10 reps, Supine Short Arc Quad: AAROM, Left, 10 reps, Supine Heel Slides: AAROM, Left, 5 reps, Supine Hip ABduction/ADduction: AAROM, Left, 5 reps, Supine Long Arc Quad: Seated, Both, 10 reps    General Comments  Pertinent Vitals/Pain Pain Assessment Pain Assessment: Faces Faces Pain Scale: Hurts even more Pain Location: L hip Pain Descriptors / Indicators: Discomfort, Operative site guarding Pain Intervention(s): Limited activity within patient's tolerance, Monitored during session, Patient requesting pain meds-RN notified, Ice applied    Home Living Family/patient expects to be discharged to:: Private residence Living Arrangements: Spouse/significant other Available Help at Discharge:  Family Type of Home: House Home Access: Stairs to enter Entrance Stairs-Rails: None Entrance Stairs-Number of Steps: 3   Home Layout: One level Home Equipment: Cane - single point Additional Comments: Pt spouse has MS and is at [facility]. Pt son out of town 2 weeks. Pt mom had recent knee surgery and pt dad is blind. Pt stepson's "car is messed up." Pt neighbor can provide potential transportation    Prior Function            PT Goals (current goals can now be found in the care plan section) Acute Rehab PT Goals Potential to Achieve Goals: Good Progress towards PT goals: Progressing toward goals    Frequency    7X/week      PT Plan      Co-evaluation              AM-PAC PT "6 Clicks" Mobility   Outcome Measure  Help needed turning from your back to your side while in a flat bed without using bedrails?: None Help needed moving from lying on your back to sitting on the side of a flat bed without using bedrails?: A Little Help needed moving to and from a bed to a chair (including a wheelchair)?: A Little Help needed standing up from a chair using your arms (e.g., wheelchair or bedside chair)?: A Little Help needed to walk in hospital room?: A Little Help needed climbing 3-5 steps with a railing? : A Lot 6 Click Score: 18    End of Session Equipment Utilized During Treatment: Gait belt Activity Tolerance: Patient tolerated treatment well Patient left: in bed;with call bell/phone within reach;with bed alarm set Nurse Communication: Mobility status PT Visit Diagnosis: Unsteadiness on feet (R26.81);Other abnormalities of gait and mobility (R26.89);Difficulty in walking, not elsewhere classified (R26.2);Pain Pain - Right/Left: Left Pain - part of body: Hip     Time: 2130-8657 PT Time Calculation (min) (ACUTE ONLY): 41 min  Charges:    $Gait Training: 8-22 mins $Therapeutic Exercise: 8-22 mins $Therapeutic Activity: 8-22 mins PT General Charges $$ ACUTE PT VISIT:  1 Visit                     Lillia Pauls, PT, DPT Acute Rehabilitation Services Office [phone]    Norval Morton 06/13/2023, 4:37 PM

## 2023-06-13 NOTE — Evaluation (Signed)
Occupational Therapy Evaluation Patient Details Name: [name] MRN: [id number] DOB: Jun 26, 1966 Today's Date: 06/13/2023   History of Present Illness 57 y.o. male presents to [facility] hospital on 06/11/2023 for elective L THA and removal of 2 Knowles pins. PMH includes HTN, coagulopathy, DMII, SCFE, chronic low back pain.   Clinical Impression   Prior to this admission, patient independent with functional mobility and ADLs, and receives transportation through [facility]. Patient presenting with L hip pain and need for minimal increased assist in order to complete bed mobility, ADLs, and functional mobility. Patient is CGA for bed mobility, min A to come into standing from an elevated surface, and min A for ADL management. Patient with good carryover of weight bearing status with functional mobility. OT recommending HHOT at discharge, and use of BSC over toilet in order to promote independence. OT will follow acutely.      If plan is discharge home, recommend the following: A little help with walking and/or transfers;A lot of help with bathing/dressing/bathroom;Assist for transportation;Help with stairs or ramp for entrance    Functional Status Assessment  Patient has had a recent decline in their functional status and demonstrates the ability to make significant improvements in function in a reasonable and predictable amount of time.  Equipment Recommendations  BSC/3in1;Tub/shower bench    Recommendations for Other Services       Precautions / Restrictions Precautions Precautions: Fall Restrictions Weight Bearing Restrictions: Yes LLE Weight Bearing: Partial weight bearing LLE Partial Weight Bearing Percentage or Pounds: 50      Mobility Bed Mobility Overal bed mobility: Needs Assistance Bed Mobility: Supine to Sit     Supine to sit: Contact guard     General bed mobility comments: OT providing light assist to LLE to advance off of bed    Transfers Overall transfer level:  Needs assistance Equipment used: Rolling walker (2 wheels) Transfers: Sit to/from Stand Sit to Stand: Contact guard assist           General transfer comment: Cues for placing LLE anteriorly to prevent full weightbearing      Balance Overall balance assessment: Needs assistance Sitting-balance support: Feet supported Sitting balance-Leahy Scale: Good     Standing balance support: Bilateral upper extremity supported Standing balance-Leahy Scale: Poor Standing balance comment: reliant on RW                           ADL either performed or assessed with clinical judgement   ADL Overall ADL's : Needs assistance/impaired Eating/Feeding: Set up;Sitting   Grooming: Set up;Sitting   Upper Body Bathing: Contact guard assist;Sitting   Lower Body Bathing: Maximal assistance;Moderate assistance;Sitting/lateral leans;Sit to/from stand   Upper Body Dressing : Set up;Sitting   Lower Body Dressing: Moderate assistance;Maximal assistance;Sitting/lateral leans;Sit to/from stand   Toilet Transfer: Minimal assistance;Ambulation;Regular Teacher, adult education Details (indicate cue type and reason): BSC over toilet Toileting- Clothing Manipulation and Hygiene: Moderate assistance;Sit to/from stand;Sitting/lateral lean Toileting - Clothing Manipulation Details (indicate cue type and reason): for thoroughness     Functional mobility during ADLs: Minimal assistance;Cueing for safety;Cueing for sequencing;Rolling walker (2 wheels) General ADL Comments: Patient presenting with L hip pain and need for minimal increased assist in order to complete bed mobility, ADLs, and functional mobility. Patient is CGA for bed mobility, min A to come into standing from an elevated surface, and min A for ADL management. Patient with good carryover of weight bearing status with functional mobility. OT recommending HHOT  at discharge, and use of BSC over toilet in order to promote independence. OT will  follow acutely.     Vision Baseline Vision/History: 1 Wears glasses Ability to See in Adequate Light: 0 Adequate Patient Visual Report: No change from baseline Vision Assessment?: No apparent visual deficits     Perception Perception: Not tested       Praxis Praxis: Not tested       Pertinent Vitals/Pain Pain Assessment Pain Assessment: Faces Pain Score: 8  Pain Location: L hip Pain Descriptors / Indicators: Discomfort, Operative site guarding Pain Intervention(s): Monitored during session, Limited activity within patient's tolerance, Premedicated before session     Extremity/Trunk Assessment Upper Extremity Assessment Upper Extremity Assessment: Defer to OT evaluation   Lower Extremity Assessment Lower Extremity Assessment: Defer to PT evaluation   Cervical / Trunk Assessment Cervical / Trunk Assessment: Normal   Communication Communication Communication: No apparent difficulties   Cognition Arousal: Alert Behavior During Therapy: WFL for tasks assessed/performed Overall Cognitive Status: Within Functional Limits for tasks assessed                                       General Comments       Exercises Exercises: Total Joint Total Joint Exercises Ankle Circles/Pumps: Left, 10 reps, Seated Quad Sets: Left, 10 reps, Seated Long Arc Quad: Left, Seated, 5 reps, AAROM   Shoulder Instructions      Home Living Family/patient expects to be discharged to:: Private residence Living Arrangements: Spouse/significant other Available Help at Discharge: Family Type of Home: House Home Access: Stairs to enter Secretary/administrator of Steps: 3 Entrance Stairs-Rails: None Home Layout: One level     Bathroom Shower/Tub: Tub/shower unit         Home Equipment: Cane - single point   Additional Comments: Pt spouse has MS and is at [facility]. Pt son out of town 2 weeks. Pt mom had recent knee surgery and pt dad is blind. Pt stepson's "car is messed up." Pt  neighbor can provide potential transportation      Prior Functioning/Environment Prior Level of Function : Independent/Modified Independent             Mobility Comments: [facility] provides transportation to appointments, delivers medication, using cane prior to surgery. ADLs Comments: independent        OT Problem List: Decreased strength;Decreased range of motion;Impaired balance (sitting and/or standing);Decreased coordination;Decreased safety awareness;Decreased knowledge of use of DME or AE;Decreased knowledge of precautions;Pain      OT Treatment/Interventions: Self-care/ADL training;Therapeutic exercise;Energy conservation;DME and/or AE instruction;Manual therapy;Therapeutic activities;Patient/family education    OT Goals(Current goals can be found in the care plan section) Acute Rehab OT Goals Patient Stated Goal: to get back home OT Goal Formulation: With patient Time For Goal Achievement: 06/27/23 Potential to Achieve Goals: Good ADL Goals Pt Will Perform Lower Body Bathing: with min assist;sitting/lateral leans;sit to/from stand Pt Will Perform Lower Body Dressing: with min assist;sit to/from stand;sitting/lateral leans Pt Will Transfer to Toilet: with contact guard assist;ambulating;regular height toilet Pt Will Perform Toileting - Clothing Manipulation and hygiene: with contact guard assist;sitting/lateral leans;sit to/from stand Pt Will Perform Tub/Shower Transfer: with min assist;Tub transfer;3 in 1  OT Frequency: Min 1X/week    Co-evaluation              AM-PAC OT "6 Clicks" Daily Activity     Outcome Measure Help from another person eating meals?:  A Little Help from another person taking care of personal grooming?: A Little Help from another person toileting, which includes using toliet, bedpan, or urinal?: A Little Help from another person bathing (including washing, rinsing, drying)?: A Little Help from another person to put on and taking off regular  upper body clothing?: A Little Help from another person to put on and taking off regular lower body clothing?: A Lot 6 Click Score: 17   End of Session Equipment Utilized During Treatment: Rolling walker (2 wheels);Gait belt Nurse Communication: Mobility status  Activity Tolerance: Patient tolerated treatment well Patient left: in chair;with call bell/phone within reach;with chair alarm set  OT Visit Diagnosis: Unsteadiness on feet (R26.81);Other abnormalities of gait and mobility (R26.89);Muscle weakness (generalized) (M62.81);Pain Pain - Right/Left: Left Pain - part of body: Hip                Time: 3244-0102 OT Time Calculation (min): 28 min Charges:  OT General Charges $OT Visit: 1 Visit OT Evaluation $OT Eval Moderate Complexity: 1 Mod OT Treatments $Self Care/Home Management : 8-22 mins  Pollyann Glen E. Kolbi Tofte, OTR/L Acute Rehabilitation Services [phone]   Cherlyn Cushing 06/13/2023, 2:41 PM

## 2023-06-14 NOTE — Progress Notes (Addendum)
Subjective: 3 Days Post-Op Procedure(s) (LRB): LEFT TOTAL HIP ARTHROPLASTY ANTERIOR APPROACH (Left) HARDWARE REMOVAL (Left) Patient reports pain as mild.    Objective: Vital signs in last 24 hours: Temp:  [98.5 F (36.9 C)-99.3 F (37.4 C)] 99.3 F (37.4 C) (12/12 0407) Pulse Rate:  [63-77] 71 (12/12 0407) Resp:  [16-18] 18 (12/12 0407) BP: (122-144)/(86-90) 144/89 (12/12 0407) SpO2:  [100 %] 100 % (12/12 0407)  Intake/Output from previous day: No intake/output data recorded. Intake/Output this shift: No intake/output data recorded.  No results for input(s): "HGB" in the last 72 hours. No results for input(s): "WBC", "RBC", "HCT", "PLT" in the last 72 hours. No results for input(s): "NA", "K", "CL", "CO2", "BUN", "CREATININE", "GLUCOSE", "CALCIUM" in the last 72 hours. No results for input(s): "LABPT", "INR" in the last 72 hours.  Neurologically intact Neurovascular intact Sensation intact distally Intact pulses distally Dorsiflexion/Plantar flexion intact Incision: scant drainage No cellulitis present Compartment soft   Assessment/Plan: 3 Days Post-Op Procedure(s) (LRB): LEFT TOTAL HIP ARTHROPLASTY ANTERIOR APPROACH (Left) HARDWARE REMOVAL (Left) Advance diet Up with therapy D/C IV fluids 50 % WBAT LLE D/c dispo- patient tells me that his son will be home this coming Saturday to help him out.  Will need to continue inpatient PT until d/c to home on Saturday.        Cristie Hem 06/14/2023, 7:56 AM

## 2023-06-14 NOTE — Progress Notes (Signed)
Physical Therapy Treatment Patient Details Name: [name] MRN: [id number] DOB: 1966-06-18 Today's Date: 06/14/2023   History of Present Illness 57 y.o. male presents to [facility] hospital on 06/11/2023 for elective L THA and removal of 2 Knowles pins. PMH includes HTN, coagulopathy, DMII, SCFE, chronic low back pain.    PT Comments  Pt with goal of ambulating to bathroom for attempt at bowel movement. Able to transition to edge of bed with use of hook of cane to guide left lower extremity off of bed. Supervision overall for transfers to standing and ambulating room distances with rolling walker. Completed ADL task at sink of washing face and brushing teeth with set up assist. Pt deferring hallway walk this session as pain medications were not quite due. Will continue to progress as tolerated.    If plan is discharge home, recommend the following: A little help with walking and/or transfers;A little help with bathing/dressing/bathroom;Assistance with cooking/housework;Assist for transportation;Help with stairs or ramp for entrance   Can travel by private vehicle        Equipment Recommendations  Rolling walker (2 wheels);BSC/3in1    Recommendations for Other Services       Precautions / Restrictions Precautions Precautions: Fall Restrictions Weight Bearing Restrictions Per Provider Order: Yes LLE Weight Bearing Per Provider Order: Partial weight bearing LLE Partial Weight Bearing Percentage or Pounds: 50     Mobility  Bed Mobility Overal bed mobility: Needs Assistance Bed Mobility: Supine to Sit     Supine to sit: Supervision     General bed mobility comments: Pt using hook of cane to progress LLE off edge of bed. Increased time, no physical assist required    Transfers Overall transfer level: Needs assistance Equipment used: Rolling walker (2 wheels) Transfers: Sit to/from Stand Sit to Stand: Supervision           General transfer comment: Cues for placing LLE  anteriorly to prevent full weightbearing. increased time to rise but no physical assist required. Able to achieve upright posture once standing after ~30 seconds. Initially crouched    Ambulation/Gait Ambulation/Gait assistance: Supervision Gait Distance (Feet): 30 Feet Assistive device: Rolling walker (2 wheels) Gait Pattern/deviations: Step-to pattern, Decreased step length - right, Decreased stance time - left, Decreased dorsiflexion - left, Trunk flexed Gait velocity: decreased     General Gait Details: Fading verbal cues, pt with appropriate use of arms on walker and slightly crouched posture. Step to pattern   Stairs             Wheelchair Mobility     Tilt Bed    Modified Rankin (Stroke Patients Only)       Balance Overall balance assessment: Needs assistance Sitting-balance support: Feet supported Sitting balance-Leahy Scale: Good     Standing balance support: Bilateral upper extremity supported Standing balance-Leahy Scale: Poor Standing balance comment: reliant on RW                            Cognition Arousal: Alert Behavior During Therapy: WFL for tasks assessed/performed Overall Cognitive Status: Within Functional Limits for tasks assessed                                          Exercises      General Comments        Pertinent Vitals/Pain Pain Assessment Pain Assessment: Faces  Faces Pain Scale: Hurts little more Pain Location: L hip Pain Descriptors / Indicators: Discomfort, Operative site guarding Pain Intervention(s): Limited activity within patient's tolerance, Monitored during session, Premedicated before session    Home Living                          Prior Function            PT Goals (current goals can now be found in the care plan section) Acute Rehab PT Goals Potential to Achieve Goals: Good Progress towards PT goals: Progressing toward goals    Frequency    7X/week       PT Plan      Co-evaluation              AM-PAC PT "6 Clicks" Mobility   Outcome Measure  Help needed turning from your back to your side while in a flat bed without using bedrails?: None Help needed moving from lying on your back to sitting on the side of a flat bed without using bedrails?: A Little Help needed moving to and from a bed to a chair (including a wheelchair)?: A Little Help needed standing up from a chair using your arms (e.g., wheelchair or bedside chair)?: A Little Help needed to walk in hospital room?: A Little Help needed climbing 3-5 steps with a railing? : A Lot 6 Click Score: 18    End of Session Equipment Utilized During Treatment: Gait belt Activity Tolerance: Patient tolerated treatment well Patient left: with call bell/phone within reach;in chair Nurse Communication: Mobility status PT Visit Diagnosis: Unsteadiness on feet (R26.81);Other abnormalities of gait and mobility (R26.89);Difficulty in walking, not elsewhere classified (R26.2);Pain Pain - Right/Left: Left Pain - part of body: Hip     Time: 0820-0901 PT Time Calculation (min) (ACUTE ONLY): 41 min  Charges:    $Therapeutic Activity: 38-52 mins PT General Charges $$ ACUTE PT VISIT: 1 Visit                     Lillia Pauls, PT, DPT Acute Rehabilitation Services Office [phone]    Norval Morton 06/14/2023, 9:08 AM

## 2023-06-14 NOTE — Plan of Care (Signed)
  Problem: Education: Goal: Knowledge of General Education information will improve Description: Including pain rating scale, medication(s)/side effects and non-pharmacologic comfort measures Outcome: Progressing   Problem: Activity: Goal: Risk for activity intolerance will decrease Outcome: Progressing   Problem: Coping: Goal: Level of anxiety will decrease Outcome: Progressing   Problem: Pain Management: Goal: General experience of comfort will improve Outcome: Not Progressing

## 2023-06-14 NOTE — Progress Notes (Signed)
Physical Therapy Treatment Patient Details Name: [name] MRN: [id number] DOB: 1966/05/02 Today's Date: 06/14/2023   History of Present Illness 57 y.o. male presents to [facility] hospital on 06/11/2023 for elective L THA and removal of 2 Knowles pins. PMH includes HTN, coagulopathy, DMII, SCFE, chronic low back pain.    PT Comments  PM session with focus on stair training. Pt has no rails at home, so practiced negotiating with steps with a walker going backwards ascending and forwards descending. Pt performed twice with cueing provided. Pt continuing to ambulate short distances with a walker at a supervision level. He continues to state his desire is to go home with assist from his step son on Saturday. It is to my knowledge that [facility] has also agreed to providing [facility] aide and PT services as well.    If plan is discharge home, recommend the following: A little help with walking and/or transfers;A little help with bathing/dressing/bathroom;Assistance with cooking/housework;Assist for transportation;Help with stairs or ramp for entrance   Can travel by private vehicle        Equipment Recommendations  Rolling walker (2 wheels);BSC/3in1    Recommendations for Other Services       Precautions / Restrictions Precautions Precautions: Fall Restrictions Weight Bearing Restrictions Per Provider Order: Yes LLE Weight Bearing Per Provider Order: Partial weight bearing LLE Partial Weight Bearing Percentage or Pounds: 50     Mobility  Bed Mobility Overal bed mobility: Needs Assistance Bed Mobility: Supine to Sit, Sit to Supine     Supine to sit: Supervision Sit to supine: Supervision   General bed mobility comments: Pt using hook of cane to progress LLE on and off edge of bed. Increased time, no physical assist required    Transfers Overall transfer level: Needs assistance Equipment used: Rolling walker (2 wheels) Transfers: Sit to/from Stand Sit to Stand: Supervision            General transfer comment: Cues for placing LLE anteriorly to prevent full weightbearing. increased time to rise but no physical assist required.    Ambulation/Gait Ambulation/Gait assistance: Supervision Gait Distance (Feet): 15 Feet Assistive device: Rolling walker (2 wheels) Gait Pattern/deviations: Step-to pattern, Decreased step length - right, Decreased stance time - left, Decreased dorsiflexion - left, Trunk flexed Gait velocity: decreased     General Gait Details: Fading verbal cues, pt with appropriate use of arms on walker and slightly crouched posture. Step to pattern   Stairs Stairs: Yes Stairs assistance: Contact guard assist Stair Management: With walker, Backwards, Forwards Number of Stairs: 4 General stair comments: Verbal and visual demonstration of technique with walker, sequencing, positioning   Wheelchair Mobility     Tilt Bed    Modified Rankin (Stroke Patients Only)       Balance Overall balance assessment: Needs assistance Sitting-balance support: Feet supported Sitting balance-Leahy Scale: Good     Standing balance support: Bilateral upper extremity supported Standing balance-Leahy Scale: Poor Standing balance comment: reliant on RW                            Cognition Arousal: Alert Behavior During Therapy: WFL for tasks assessed/performed Overall Cognitive Status: Within Functional Limits for tasks assessed                                          Exercises  General Comments        Pertinent Vitals/Pain Pain Assessment Pain Assessment: Faces Faces Pain Scale: Hurts little more Pain Location: L hip Pain Descriptors / Indicators: Discomfort, Operative site guarding Pain Intervention(s): Limited activity within patient's tolerance, Monitored during session, Premedicated before session    Home Living                          Prior Function            PT Goals (current goals can  now be found in the care plan section) Acute Rehab PT Goals Potential to Achieve Goals: Good Progress towards PT goals: Progressing toward goals    Frequency    7X/week      PT Plan      Co-evaluation              AM-PAC PT "6 Clicks" Mobility   Outcome Measure  Help needed turning from your back to your side while in a flat bed without using bedrails?: None Help needed moving from lying on your back to sitting on the side of a flat bed without using bedrails?: A Little Help needed moving to and from a bed to a chair (including a wheelchair)?: A Little Help needed standing up from a chair using your arms (e.g., wheelchair or bedside chair)?: A Little Help needed to walk in hospital room?: A Little Help needed climbing 3-5 steps with a railing? : A Little 6 Click Score: 19    End of Session Equipment Utilized During Treatment: Gait belt Activity Tolerance: Patient tolerated treatment well Patient left: with call bell/phone within reach;in chair Nurse Communication: Mobility status PT Visit Diagnosis: Unsteadiness on feet (R26.81);Other abnormalities of gait and mobility (R26.89);Difficulty in walking, not elsewhere classified (R26.2);Pain Pain - Right/Left: Left Pain - part of body: Hip     Time: 7846-9629 PT Time Calculation (min) (ACUTE ONLY): 47 min  Charges:    $Gait Training: 23-37 mins $Therapeutic Activity: 8-22 mins PT General Charges $$ ACUTE PT VISIT: 1 Visit                     Lillia Pauls, PT, DPT Acute Rehabilitation Services Office [phone]    Norval Morton 06/14/2023, 4:29 PM

## 2023-06-14 NOTE — Progress Notes (Signed)
    Durable Medical Equipment  (From admission, onward)           Start     Ordered   06/13/23 1536  DME Bedside commode  Once       Comments: Confine to one room  Question:  Patient needs a bedside commode to treat with the following condition  Answer:  History of hip replacement   06/13/23 1537   06/11/23 1705  DME Walker rolling  Once       Question:  Patient needs a walker to treat with the following condition  Answer:  History of hip replacement   06/11/23 1704   06/11/23 1705  DME 3 n 1  Once        06/11/23 1704

## 2023-06-15 ENCOUNTER — Inpatient Hospital Stay (HOSPITAL_COMMUNITY): Payer: Medicare (Managed Care)

## 2023-06-15 NOTE — Plan of Care (Signed)
  Problem: Education: Goal: Knowledge of General Education information will improve Description: Including pain rating scale, medication(s)/side effects and non-pharmacologic comfort measures Outcome: Progressing   Problem: Health Behavior/Discharge Planning: Goal: Ability to manage health-related needs will improve Outcome: Progressing   Problem: Activity: Goal: Risk for activity intolerance will decrease Outcome: Progressing   Problem: Nutrition: Goal: Adequate nutrition will be maintained Outcome: Progressing   Problem: Coping: Goal: Level of anxiety will decrease Outcome: Progressing   Problem: Safety: Goal: Ability to remain free from injury will improve Outcome: Progressing   Problem: Pain Management: Goal: General experience of comfort will improve Outcome: Not Progressing

## 2023-06-15 NOTE — Progress Notes (Signed)
Physical Therapy Treatment Patient Details Name: [name] MRN: [id number] DOB: 01-Sep-1965 Today's Date: 06/15/2023   History of Present Illness 57 y.o. male presents to [facility] hospital on 06/11/2023 for elective L THA and removal of 2 Knowles pins. PMH includes HTN, coagulopathy, DMII, SCFE, chronic low back pain.    PT Comments  Seen for second session and pt fatigued after up in chair all afternoon.  Did ambulate in the room and able to practice HEP with issued gait belt to initiate.  PT will follow up prior to d/c in am.     If plan is discharge home, recommend the following: A little help with walking and/or transfers;A little help with bathing/dressing/bathroom;Assistance with cooking/housework;Assist for transportation;Help with stairs or ramp for entrance   Can travel by private vehicle        Equipment Recommendations  Rolling walker (2 wheels);BSC/3in1    Recommendations for Other Services       Precautions / Restrictions Precautions Precautions: Fall Restrictions LLE Weight Bearing Per Provider Order: Partial weight bearing LLE Partial Weight Bearing Percentage or Pounds: 50     Mobility  Bed Mobility Overal bed mobility: Needs Assistance         Sit to supine: Supervision   General bed mobility comments: use of cane and R LE to move L LE into bed    Transfers Overall transfer level: Needs assistance Equipment used: Rolling walker (2 wheels) Transfers: Sit to/from Stand Sit to Stand: Supervision           General transfer comment: heavy UE support    Ambulation/Gait Ambulation/Gait assistance: Supervision Gait Distance (Feet): 30 Feet Assistive device: Rolling walker (2 wheels) Gait Pattern/deviations: Step-to pattern, Step-through pattern, Decreased stride length, Trunk flexed       General Gait Details: walked in room   Stairs Stairs: Yes Stairs assistance: Min assist Stair Management: With walker, Backwards, Forwards Number of  Stairs: 2 General stair comments: assist for holding walker for reverse technique   Wheelchair Mobility     Tilt Bed    Modified Rankin (Stroke Patients Only)       Balance Overall balance assessment: Needs assistance Sitting-balance support: Feet supported Sitting balance-Leahy Scale: Good     Standing balance support: Bilateral upper extremity supported Standing balance-Leahy Scale: Poor Standing balance comment: reliant on RW                            Cognition Arousal: Alert Behavior During Therapy: WFL for tasks assessed/performed Overall Cognitive Status: Within Functional Limits for tasks assessed                                          Exercises Total Joint Exercises Ankle Circles/Pumps: AROM, 10 reps, Supine, Both Quad Sets: Left, Supine, 5 reps Heel Slides: AAROM, Left, 5 reps, Supine Hip ABduction/ADduction: AAROM, Left, 5 reps, Supine Long Arc Quad: Seated, 5 reps, Right    General Comments General comments (skin integrity, edema, etc.): educated in HEP with handout, issued gait belt to help with self assisted therex      Pertinent Vitals/Pain Pain Assessment Pain Assessment: Faces Faces Pain Scale: Hurts little more Pain Location: L hip Pain Descriptors / Indicators: Discomfort, Operative site guarding Pain Intervention(s): Monitored during session, Repositioned, Ice applied    Home Living  Prior Function            PT Goals (current goals can now be found in the care plan section) Progress towards PT goals: Progressing toward goals    Frequency    7X/week      PT Plan      Co-evaluation              AM-PAC PT "6 Clicks" Mobility   Outcome Measure  Help needed turning from your back to your side while in a flat bed without using bedrails?: None Help needed moving from lying on your back to sitting on the side of a flat bed without using bedrails?: None Help  needed moving to and from a bed to a chair (including a wheelchair)?: None Help needed standing up from a chair using your arms (e.g., wheelchair or bedside chair)?: None Help needed to walk in hospital room?: None Help needed climbing 3-5 steps with a railing? : A Little 6 Click Score: 23    End of Session Equipment Utilized During Treatment: Gait belt Activity Tolerance: Patient tolerated treatment well Patient left: with family/visitor present Nurse Communication: Mobility status PT Visit Diagnosis: Unsteadiness on feet (R26.81);Other abnormalities of gait and mobility (R26.89);Difficulty in walking, not elsewhere classified (R26.2);Pain Pain - Right/Left: Left Pain - part of body: Hip     Time: 1610-9604 PT Time Calculation (min) (ACUTE ONLY): 26 min  Charges:    $Gait Training: 8-22 mins $Therapeutic Exercise: 8-22 mins $Therapeutic Activity: 8-22 mins PT General Charges $$ ACUTE PT VISIT: 1 Visit                     Sheran Lawless, PT Acute Rehabilitation Services Office:[phone] 06/15/2023    Elray Mcgregor 06/15/2023, 4:58 PM

## 2023-06-15 NOTE — Progress Notes (Signed)
Subjective: 4 Days Post-Op Procedure(s) (LRB): LEFT TOTAL HIP ARTHROPLASTY ANTERIOR APPROACH (Left) HARDWARE REMOVAL (Left) Patient reports pain as mild.    Objective: Vital signs in last 24 hours: Temp:  [98.5 F (36.9 C)-99.5 F (37.5 C)] 98.5 F (36.9 C) (12/13 0739) Pulse Rate:  [62-77] 74 (12/13 0739) Resp:  [16-18] 16 (12/13 0739) BP: (124-134)/(73-90) 134/87 (12/13 0739) SpO2:  [98 %-100 %] 100 % (12/13 0739)  Intake/Output from previous day: 12/12 0701 - 12/13 0700 In: 720 [P.O.:720] Out: 1370 [Urine:1370] Intake/Output this shift: No intake/output data recorded.  No results for input(s): "HGB" in the last 72 hours. No results for input(s): "WBC", "RBC", "HCT", "PLT" in the last 72 hours. No results for input(s): "NA", "K", "CL", "CO2", "BUN", "CREATININE", "GLUCOSE", "CALCIUM" in the last 72 hours. No results for input(s): "LABPT", "INR" in the last 72 hours.  Neurologically intact Neurovascular intact Sensation intact distally Intact pulses distally Dorsiflexion/Plantar flexion intact Incision: scant drainage No cellulitis present Compartment soft   Assessment/Plan: 4 Days Post-Op Procedure(s) (LRB): LEFT TOTAL HIP ARTHROPLASTY ANTERIOR APPROACH (Left) HARDWARE REMOVAL (Left) Advance diet Up with therapy Plan for discharge tomorrow home with son and hhpt 50% weight bearing LLE      Cristie Hem 06/15/2023, 7:42 AM

## 2023-06-15 NOTE — Progress Notes (Signed)
Physical Therapy Treatment Patient Details Name: [name] MRN: [id number] DOB: 1966-05-28 Today's Date: 06/15/2023   History of Present Illness 57 y.o. male presents to [facility] hospital on 06/11/2023 for elective L THA and removal of 2 Knowles pins. PMH includes HTN, coagulopathy, DMII, SCFE, chronic low back pain.    PT Comments  Patient with difficulty getting pain meds overnight per his report and initially concerned about transportation and if son can meet him to let him in the home.  After discussion with RNCM plans now back to discharging tomorrow.  Patient able to ambulate and transfer with S throughout, only needing help for walker on stairs.  PT will continue to follow and hopeful for d/c home with intermittent family support and follow up PT.   If plan is discharge home, recommend the following: A little help with walking and/or transfers;A little help with bathing/dressing/bathroom;Assistance with cooking/housework;Assist for transportation;Help with stairs or ramp for entrance   Can travel by private vehicle        Equipment Recommendations  Rolling walker (2 wheels);BSC/3in1    Recommendations for Other Services       Precautions / Restrictions Precautions Precautions: Fall Restrictions LLE Weight Bearing Per Provider Order: Partial weight bearing LLE Partial Weight Bearing Percentage or Pounds: 50     Mobility  Bed Mobility               General bed mobility comments: in recliner    Transfers Overall transfer level: Needs assistance Equipment used: Rolling walker (2 wheels) Transfers: Sit to/from Stand Sit to Stand: Supervision                Ambulation/Gait Ambulation/Gait assistance: Supervision Gait Distance (Feet): 70 Feet Assistive device: Rolling walker (2 wheels) Gait Pattern/deviations: Step-to pattern, Step-through pattern, Decreased stride length, Trunk flexed       General Gait Details: mildly crouched posture, appropriate use  of RW for PWB   Stairs Stairs: Yes Stairs assistance: Min assist Stair Management: With walker, Backwards, Forwards Number of Stairs: 2 General stair comments: assist for holding walker for reverse technique   Wheelchair Mobility     Tilt Bed    Modified Rankin (Stroke Patients Only)       Balance Overall balance assessment: Needs assistance Sitting-balance support: Feet supported Sitting balance-Leahy Scale: Good     Standing balance support: Bilateral upper extremity supported Standing balance-Leahy Scale: Poor Standing balance comment: reliant on RW                            Cognition Arousal: Alert Behavior During Therapy: WFL for tasks assessed/performed Overall Cognitive Status: Within Functional Limits for tasks assessed                                          Exercises      General Comments General comments (skin integrity, edema, etc.): complaining about follow through with current health management group, eager for transitioning once discharged; also perseverating on pain mangement schedule      Pertinent Vitals/Pain Pain Assessment Faces Pain Scale: Hurts little more Pain Location: L hip Pain Descriptors / Indicators: Discomfort, Operative site guarding Pain Intervention(s): Limited activity within patient's tolerance, Monitored during session, Premedicated before session, Ice applied    Home Living  Prior Function            PT Goals (current goals can now be found in the care plan section) Progress towards PT goals: Progressing toward goals    Frequency    7X/week      PT Plan      Co-evaluation              AM-PAC PT "6 Clicks" Mobility   Outcome Measure  Help needed turning from your back to your side while in a flat bed without using bedrails?: None Help needed moving from lying on your back to sitting on the side of a flat bed without using bedrails?: A  Little Help needed moving to and from a bed to a chair (including a wheelchair)?: None Help needed standing up from a chair using your arms (e.g., wheelchair or bedside chair)?: None Help needed to walk in hospital room?: None Help needed climbing 3-5 steps with a railing? : A Little 6 Click Score: 22    End of Session Equipment Utilized During Treatment: Gait belt Activity Tolerance: Patient tolerated treatment well Patient left: in chair   PT Visit Diagnosis: Unsteadiness on feet (R26.81);Other abnormalities of gait and mobility (R26.89);Difficulty in walking, not elsewhere classified (R26.2);Pain Pain - Right/Left: Left Pain - part of body: Hip     Time: 1610-9604 PT Time Calculation (min) (ACUTE ONLY): 29 min  Charges:    $Gait Training: 8-22 mins $Therapeutic Activity: 8-22 mins PT General Charges $$ ACUTE PT VISIT: 1 Visit                     Sheran Lawless, PT Acute Rehabilitation Services [phone] 06/15/2023    Elray Mcgregor 06/15/2023, 3:12 PM

## 2023-06-16 NOTE — Progress Notes (Signed)
Physical Therapy Treatment Patient Details Name: [name] MRN: [id number] DOB: Aug 07, 1965 Today's Date: 06/16/2023   History of Present Illness 57 y.o. male presents to [facility] hospital on 06/11/2023 for elective L THA and removal of 2 Knowles pins. PMH includes HTN, coagulopathy, DMII, SCFE, chronic low back pain, multiple back surgeries    PT Comments  Pt feels ready for DC home today.   He is concerned about his equipment arriving to his house. I have answered all patient's question regarding PT and mobility.    I have encouraged the patient to gradually increase activity daily to tolerance.       If plan is discharge home, recommend the following: A little help with walking and/or transfers;A little help with bathing/dressing/bathroom;Assistance with cooking/housework;Assist for transportation;Help with stairs or ramp for entrance   Can travel by private vehicle        Equipment Recommendations  Rolling walker (2 wheels);BSC/3in1    Recommendations for Other Services       Precautions / Restrictions Precautions Precautions: Fall Restrictions Weight Bearing Restrictions Per Provider Order: Yes LLE Weight Bearing Per Provider Order: Partial weight bearing     Mobility  Bed Mobility Overal bed mobility: Needs Assistance Bed Mobility: Supine to Sit     Supine to sit: Supervision     General bed mobility comments: use of cane and R LE to move L LE out of bed    Transfers Overall transfer level: Needs assistance Equipment used: Rolling walker (2 wheels) Transfers: Sit to/from Stand Sit to Stand: Supervision           General transfer comment: relies heavily on UE to power up    Ambulation/Gait Ambulation/Gait assistance: Supervision Gait Distance (Feet): 100 Feet Assistive device: Rolling walker (2 wheels) Gait Pattern/deviations: Step-to pattern, Decreased stride length Gait velocity: decreased         Stairs             Wheelchair Mobility      Tilt Bed    Modified Rankin (Stroke Patients Only)       Balance Overall balance assessment: Needs assistance         Standing balance support: Reliant on assistive device for balance, Bilateral upper extremity supported                                Cognition Arousal: Alert Behavior During Therapy: WFL for tasks assessed/performed Overall Cognitive Status: Within Functional Limits for tasks assessed                                          Exercises Total Joint Exercises Long Arc Quad: AAROM, Left, 10 reps, Seated    General Comments General comments (skin integrity, edema, etc.): No family present.      Pertinent Vitals/Pain Pain Assessment Pain Assessment: 0-10 Pain Score: 7  Pain Location: L hip Pain Descriptors / Indicators: Aching, Sore Pain Intervention(s): Limited activity within patient's tolerance, Monitored during session    Home Living                          Prior Function            PT Goals (current goals can now be found in the care plan section) Acute Rehab PT Goals Patient Stated  Goal: to have a BM and go home today Progress towards PT goals: Progressing toward goals    Frequency    7X/week      PT Plan      Co-evaluation              AM-PAC PT "6 Clicks" Mobility   Outcome Measure  Help needed turning from your back to your side while in a flat bed without using bedrails?: None Help needed moving from lying on your back to sitting on the side of a flat bed without using bedrails?: None Help needed moving to and from a bed to a chair (including a wheelchair)?: A Little Help needed standing up from a chair using your arms (e.g., wheelchair or bedside chair)?: A Little Help needed to walk in hospital room?: A Little Help needed climbing 3-5 steps with a railing? : A Little 6 Click Score: 20    End of Session Equipment Utilized During Treatment: Gait belt Activity Tolerance:  Patient tolerated treatment well Patient left: Other (comment) (in bathroom. RN and front desk aware) Nurse Communication: Other (comment) (Pt left in BR attempting BM) PT Visit Diagnosis: Unsteadiness on feet (R26.81);Other abnormalities of gait and mobility (R26.89);Difficulty in walking, not elsewhere classified (R26.2);Pain Pain - Right/Left: Left Pain - part of body: Hip     Time: 4403-4742 PT Time Calculation (min) (ACUTE ONLY): 34 min  Charges:    $Gait Training: 23-37 mins PT General Charges $$ ACUTE PT VISIT: 1 Visit                     Lavona Mound, PT   Acute Rehabilitation Services  Office [phone] 06/16/2023    Donnella Sham 06/16/2023, 1:14 PM

## 2023-06-16 NOTE — Discharge Summary (Signed)
Patient ID: [name] MRN: [id number] DOB/AGE: 10-08-1965 57 y.o.  Admit date: 06/11/2023 Discharge date: 06/16/2023  Admission Diagnoses:  Post-traumatic osteoarthritis of left hip  Discharge Diagnoses:  Principal Problem:   Post-traumatic osteoarthritis of left hip Active Problems:   Status post total hip replacement, left   Past Medical History:  Diagnosis Date   Anemia 12/2016   in hospital for dcon rat poisioning for 6 days took vitamin k , accidental poisining   Blood dyscrasia    clot on lower spine after 6th back surgery could not move legs, clot removed then could move legs   Complication of anesthesia    after 6th back surgery clot on spine, could not move legs, had to have clot removed then could move legs   COPD (chronic obstructive pulmonary disease) (HCC)    chronic Bronchhititis   DJD (degenerative joint disease)    lower and upper   GERD (gastroesophageal reflux disease)    occ, no meds for   Hypertension    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 male who was admitted 06/11/2023 with a diagnosis of Post-traumatic osteoarthritis of left hip and went to the operating room on 06/11/2023 and underwent the above named procedures.    He was given perioperative antibiotics:  Anti-infectives (From admission, onward)    Start     Dose/Rate Route Frequency Ordered Stop   06/13/23 0000  doxycycline (VIBRAMYCIN) 100 MG capsule        100 mg Oral 2 times daily 06/13/23 0753     06/11/23 2200  doxycycline (VIBRA-TABS) tablet 100 mg       Note to Pharmacy: To be taken after surgery     100 mg Oral 2 times daily 06/11/23 1704     06/11/23 1830  ceFAZolin  (ANCEF) IVPB 2g/100 mL premix        2 g 200 mL/hr over 30 Minutes Intravenous Every 6 hours 06/11/23 1704 06/12/23 0149   06/11/23 1327  vancomycin (VANCOCIN) powder  Status:  Discontinued          As needed 06/11/23 1327 06/11/23 1546   06/11/23 1141  ceFAZolin (ANCEF) 2-4 GM/100ML-% IVPB       Note to Pharmacy: Dorann Ou N: cabinet override      06/11/23 1141 06/11/23 1305   06/11/23 1130  ceFAZolin (ANCEF) IVPB 2g/100 mL premix        2 g 200 mL/hr over 30 Minutes Intravenous On call to O.R. 06/11/23 1118 06/11/23 1310   06/11/23 0000  doxycycline (VIBRAMYCIN) 100 MG capsule  Status:  Discontinued        100 mg Oral 2 times daily 06/11/23 1541 06/13/23      .  He was given sequential compression devices, early ambulation, and appropriate chemoprophylaxis for DVT prophylaxis.  He benefited maximally from the hospital stay and there were no complications.    Recent vital signs:  Vitals:   06/16/23 0300 06/16/23 0901  BP: 128/78 138/87  Pulse: 76 70  Resp:    Temp: 98.7 F (37.1 C) 98.6 F (37 C)  SpO2: 100% 100%    Recent laboratory studies:  Lab  Results  Component Value Date   HGB 14.2 06/06/2023   HGB 12.7 (L) 12/05/2016   HGB 13.6 10/30/2016   Lab Results  Component Value Date   WBC 2.8 (L) 06/06/2023   PLT 170 06/06/2023   Lab Results  Component Value Date   INR 1.10 (L) 12/05/2016   Lab Results  Component Value Date   NA 134 (L) 06/06/2023   K 4.3 06/06/2023   CL 98 06/06/2023   CO2 25 06/06/2023   BUN 6 06/06/2023   CREATININE 0.97 06/06/2023   GLUCOSE 119 (H) 06/06/2023    Discharge Medications:   Allergies as of 06/16/2023       Reactions   Darvocet [propoxyphene N-acetaminophen] Itching, Nausea And Vomiting   Onion Itching        Medication List     STOP taking these medications    psyllium 0.52 g capsule Commonly known as: REGULOID       TAKE these medications    albuterol 108 (90 Base) MCG/ACT inhaler Commonly known  as: VENTOLIN HFA Inhale 2 puffs into the lungs every 6 (six) hours as needed for wheezing or shortness of breath.   aspirin EC 81 MG tablet Take 1 tablet (81 mg total) by mouth 2 (two) times daily. To be taken after surgery to prevent blood clots   docusate sodium 100 MG capsule Commonly known as: Colace Take 1 capsule (100 mg total) by mouth daily as needed. What changed: when to take this   doxycycline 100 MG capsule Commonly known as: Vibramycin Take 1 capsule (100 mg total) by mouth 2 (two) times daily. To be taken after surgery   gabapentin 400 MG capsule Commonly known as: NEURONTIN Take 400 mg by mouth 2 (two) times daily.   methocarbamol 750 MG tablet Commonly known as: Robaxin-750 Take 1 tablet (750 mg total) by mouth 2 (two) times daily as needed for muscle spasms.   metoprolol succinate 50 MG 24 hr tablet Commonly known as: TOPROL-XL Take 50 mg by mouth daily. Take with or immediately following a meal.   ondansetron 4 MG tablet Commonly known as: Zofran Take 1 tablet (4 mg total) by mouth every 8 (eight) hours as needed for nausea or vomiting.   oxyCODONE-acetaminophen 5-325 MG tablet Commonly known as: Percocet Take 1-2 tablets by mouth every 6 (six) hours as needed. To be taken after surgery   Suboxone 4-1 MG Film Generic drug: Buprenorphine HCl-Naloxone HCl Place 1 Film under the tongue in the morning and at bedtime.   vitamin D3 50 MCG (2000 UT) Caps Take 2,000 Units by mouth daily.               Durable Medical Equipment  (From admission, onward)           Start     Ordered   06/13/23 1536  DME Bedside commode  Once       Comments: Confine to one room  Question:  Patient needs a bedside commode to treat with the following condition  Answer:  History of hip replacement   06/13/23 1537   06/11/23 1705  DME Walker rolling  Once       Question:  Patient needs a walker to treat with the following condition  Answer:  History of hip replacement    06/11/23 1704   06/11/23 1705  DME 3 n 1  Once        06/11/23 1704  Diagnostic Studies: DG Pelvis Portable Result Date: 06/15/2023 CLINICAL DATA:  Left total hip arthroplasty EXAM: PORTABLE PELVIS 1-2 VIEWS COMPARISON:  06/11/2023 FINDINGS: Surgical changes of left total hip arthroplasty are again identified. Prostatic lucency within the proximal left femoral diaphyseal cortex laterally is again identified and is unchanged, likely postsurgical in nature related to extraction of previously noted femoral ORIF hardware. No superimposed acute fracture. Normal alignment. Right hip ORIF has been performed. Right hip joint space is preserved. IMPRESSION: 1. Left total hip arthroplasty. No superimposed acute fracture. Stable probable postsurgical cortical defect within the proximal left femoral diaphysis. Electronically Signed   By: Helyn Numbers M.D.   On: 06/15/2023 20:59   DG HIP UNILAT WITH PELVIS 1V LEFT Result Date: 06/11/2023 CLINICAL DATA:  Total left hip arthroplasty. Intraoperative fluoroscopy. EXAM: DG HIP (WITH OR WITHOUT PELVIS) 1V*L* COMPARISON:  Pelvis and left hip radiographs 01/16/2023 FINDINGS: Images were performed intraoperatively without the presence of a radiologist. The patient is undergoing removal of two prior proximal femoral screws and placement of total left hip arthroplasty hardware. Redemonstration of partially visualized two proximal right femoral fixation screws. There is a concavity of the proximal lateral left femoral diaphysis from postsurgical change during hardware removal and new hardware placement. Total fluoroscopy images: 6 Total fluoroscopy time: 36 seconds Total dose: Radiation Exposure Index (as provided by the fluoroscopic device): 4.25 mGy air Kerma Please see intraoperative findings for further detail. IMPRESSION: Intraoperative fluoroscopy for total left hip arthroplasty. Electronically Signed   By: Neita Garnet M.D.   On: 06/11/2023 17:09   DG  Pelvis Portable Result Date: 06/11/2023 CLINICAL DATA:  Postoperative left hip.  Evaluate hardware. EXAM: PORTABLE PELVIS 1-2 VIEWS COMPARISON:  Pelvis and left hip radiographs 01/16/2023 FINDINGS: Redemonstration of two longitudinal screws traversing the right proximal femoral intertrochanteric region, femoral neck, and femoral head. Interval removal of similar screws from the proximal left femur. Interval total left hip arthroplasty. There is crescentic lucency within the proximal lateral left femoral diaphysis measuring approximately 10 mm in transverse depth and 35 mm in craniocaudal length, presumably postoperative change for removing the prior hardware and placement of the new hardware. Otherwise, no perihardware lucency is seen to indicate hardware failure or loosening. IMPRESSION: 1. Interval total left hip arthroplasty. 2. Crescentic lucency within the proximal lateral left femoral diaphysis, presumably postoperative change for removing the prior hardware and placement of the new hardware. Otherwise, no perihardware lucency is seen to indicate hardware failure or loosening. Electronically Signed   By: Neita Garnet M.D.   On: 06/11/2023 16:32   DG C-Arm 1-60 Min-No Report Result Date: 06/11/2023 Fluoroscopy was utilized by the requesting physician.  No radiographic interpretation.   DG C-Arm 1-60 Min-No Report Result Date: 06/11/2023 Fluoroscopy was utilized by the requesting physician.  No radiographic interpretation.    Disposition:      Follow-up Information     Cristie Hem, PA-C. Schedule an appointment as soon as possible for a visit in 2 week(s).   Specialty: Orthopedic Surgery Contact information: [address] [place] [address] [phone]         [facility], [facility] Follow up.   Contact information: [address] [place] [address] [phone]                  Signed: Huel Cote 06/16/2023, 11:17 AM

## 2023-06-16 NOTE — Progress Notes (Signed)
Subjective: 5 Days Post-Op Procedure(s) (LRB): LEFT TOTAL HIP ARTHROPLASTY ANTERIOR APPROACH (Left) HARDWARE REMOVAL (Left) Patient reports pain as mild.    Objective: Vital signs in last 24 hours: Temp:  [98.6 F (37 C)-98.7 F (37.1 C)] 98.6 F (37 C) (12/14 0901) Pulse Rate:  [64-78] 70 (12/14 0901) Resp:  [16] 16 (12/13 1443) BP: (128-138)/(78-87) 138/87 (12/14 0901) SpO2:  [100 %] 100 % (12/14 0901)  Intake/Output from previous day: 12/13 0701 - 12/14 0700 In: 720 [P.O.:720] Out: 650 [Urine:650] Intake/Output this shift: Total I/O In: -  Out: 600 [Urine:600]  No results for input(s): "HGB" in the last 72 hours. No results for input(s): "WBC", "RBC", "HCT", "PLT" in the last 72 hours. No results for input(s): "NA", "K", "CL", "CO2", "BUN", "CREATININE", "GLUCOSE", "CALCIUM" in the last 72 hours. No results for input(s): "LABPT", "INR" in the last 72 hours.  Neurologically intact Neurovascular intact Sensation intact distally Intact pulses distally Dorsiflexion/Plantar flexion intact Incision: scant drainage No cellulitis present Compartment soft   Assessment/Plan: 5 Days Post-Op Procedure(s) (LRB): LEFT TOTAL HIP ARTHROPLASTY ANTERIOR APPROACH (Left) HARDWARE REMOVAL (Left) Plan home today  50% weight bearing LLE      [name] 06/16/2023, 11:25 AM

## 2023-06-16 NOTE — Progress Notes (Signed)
Prescription and dc instructions given to patient.

## 2023-06-18 ENCOUNTER — Telehealth: Payer: Self-pay

## 2023-06-18 NOTE — Telephone Encounter (Signed)
Talked with Crystal and advised her of Dr. Warren Danes message.  Voiced that she understands.

## 2023-06-18 NOTE — Telephone Encounter (Signed)
100 mg  BID x 6 weeks

## 2023-06-18 NOTE — Telephone Encounter (Signed)
Crystal,  NP with [facility] would like clarification on the quantity of the Doxycycline. CB# [phone].  Please advise.  Thank you.

## 2023-06-27 NOTE — Progress Notes (Unsigned)
Post-Op Visit Note   Patient: [name]           Date of Birth: 1965-09-12           MRN: [id number] Visit Date: 06/28/2023 PCP: [facility]   Assessment & Plan:  Chief Complaint: No chief complaint on file.  Visit Diagnoses:  1. Status post total hip replacement, left     Plan: ***  Follow-Up Instructions: No follow-ups on file.   Orders:  No orders of the defined types were placed in this encounter.  No orders of the defined types were placed in this encounter.   Imaging: No results found.  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    Hematemesis 10/24/2016   Past Medical History:  Diagnosis Date   Anemia 12/2016   in hospital for dcon rat poisioning for 6 days took vitamin k , accidental poisining   Blood dyscrasia    clot on lower spine after 6th back surgery could not move legs, clot removed then could move legs   Complication of anesthesia    after 6th back surgery clot on spine, could not move legs, had to have clot removed then could move legs   COPD (chronic obstructive pulmonary disease) (HCC)    chronic Bronchhititis   DJD (degenerative joint disease)    lower and upper   GERD (gastroesophageal reflux disease)    occ, no meds for   Hypertension    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   Seizures (HCC)    last seizure 5 years ago no cause  found for seizures    Family History  Problem Relation Age of Onset   Diabetes Mellitus II Mother     Past Surgical History:  Procedure Laterality Date   4 surgeries right wrist     due to crush   ANTERIOR CERVICAL DECOMP/DISCECTOMY FUSION  12/20/2011   Procedure: ANTERIOR CERVICAL DECOMPRESSION/DISCECTOMY FUSION 2 LEVELS;  Surgeon: Mariam Dollar, MD;  Location: [facility];  Service: Neurosurgery;  Laterality: N/A;  Cervical four-five, five-six anterior cervical decompression/discectomy fusion with plating   BACK SURGERY     6 from degenerative disease.Marland Kitchen  1 upper neck   HARDWARE REMOVAL Left 06/11/2023   Procedure: HARDWARE REMOVAL;  Surgeon: Tarry Kos, MD;  Location: [facility] OR;  Service: Orthopedics;  Laterality: Left;   HIP SURGERY Bilateral    rods to each hip- hip can ot of socket.   TONSILLECTOMY     TOTAL HIP ARTHROPLASTY Left 06/11/2023   Procedure: LEFT TOTAL HIP ARTHROPLASTY ANTERIOR APPROACH;  Surgeon: Tarry Kos, MD;  Location: [facility] OR;  Service: Orthopedics;  Laterality: Left;  3-C   Social History   Occupational History   Not on file  Tobacco Use   Smoking status: Former    Current packs/day: 0.00    Average packs/day: 1 pack/day for 20.0  years (20.0 ttl pk-yrs)    Types: Cigarettes    Start date: 09/22/1996    Quit date: 09/22/2016    Years since quitting: 6.7   Smokeless tobacco: Never  Vaping Use   Vaping status: Never Used  Substance and Sexual Activity   Alcohol use: Yes    Comment: occassional    Drug use: Yes    Types: Marijuana   Sexual activity: Not on file

## 2023-06-28 ENCOUNTER — Ambulatory Visit (INDEPENDENT_AMBULATORY_CARE_PROVIDER_SITE_OTHER): Payer: Medicare (Managed Care) | Admitting: Orthopaedic Surgery

## 2023-06-28 ENCOUNTER — Other Ambulatory Visit (INDEPENDENT_AMBULATORY_CARE_PROVIDER_SITE_OTHER): Payer: Medicare (Managed Care)

## 2023-06-28 ENCOUNTER — Encounter: Payer: Self-pay | Admitting: Orthopaedic Surgery

## 2023-06-28 DIAGNOSIS — Z96642 Presence of left artificial hip joint: Secondary | ICD-10-CM

## 2023-07-26 ENCOUNTER — Encounter: Payer: Medicare (Managed Care) | Admitting: Physician Assistant

## 2023-07-31 ENCOUNTER — Encounter: Payer: Self-pay | Admitting: Physician Assistant

## 2023-07-31 ENCOUNTER — Other Ambulatory Visit (INDEPENDENT_AMBULATORY_CARE_PROVIDER_SITE_OTHER): Payer: Medicare (Managed Care)

## 2023-07-31 ENCOUNTER — Ambulatory Visit (INDEPENDENT_AMBULATORY_CARE_PROVIDER_SITE_OTHER): Payer: Medicare (Managed Care) | Admitting: Physician Assistant

## 2023-07-31 DIAGNOSIS — Z96642 Presence of left artificial hip joint: Secondary | ICD-10-CM | POA: Diagnosis not present

## 2023-07-31 NOTE — Addendum Note (Signed)
Addended by: Cristie Hem on: 07/31/2023 02:34 PM   Modules accepted: Level of Service

## 2023-07-31 NOTE — Progress Notes (Addendum)
Post-Op Visit Note   Patient: [name]           Date of Birth: 1965/12/07           MRN: [id number] Visit Date: 07/31/2023 PCP: [facility]   Assessment & Plan:  Chief Complaint:  Chief Complaint  Patient presents with   Left Hip - Follow-up    Left total hip arthroplasty 06/11/2023   Visit Diagnoses:  1. Status post total hip replacement, left     Plan: Patient is a pleasant 58 year old gentleman who comes in today 6 weeks status post removal left hip Knowles pins followed by total hip arthroplasty, date of surgery 06/11/2023.  He has been 50% weightbearing using a walker.  He has been compliant taking a baby aspirin twice daily for DVT prophylaxis.  He is on Suboxone for pain.  Examination of his left hip reveals fully healed surgical scars without complication.  Calves are soft nontender.  He is neurovascularly intact distally.  At this point, we will allow him to transition to full weightbearing.  I have provided him with a hard prescription for PT at [facility].  He may discontinue his baby aspirin from our standpoint.  He will follow-up with [place] in 6 weeks for repeat evaluation and AP pelvis x-rays.  Dental prophylaxis reinforced.  Call with concerns or questions.  Follow-Up Instructions: Return in about 6 weeks (around 09/11/2023).   Orders:  Orders Placed This Encounter  Procedures   XR Pelvis 1-2 Views   Ambulatory referral to Physical Therapy   No orders of the defined types were placed in this encounter.   Imaging: XR Pelvis 1-2 Views Result Date: 07/31/2023 X-rays reveal a well-seated prosthesis without complication.  There is some evidence of consolidation of the area where the Knowles pins were removed.   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    Hematemesis 10/24/2016   Past Medical History:  Diagnosis Date   Anemia 12/2016   in hospital for dcon rat poisioning for 6 days took vitamin k , accidental poisining   Blood dyscrasia    clot on lower spine after 6th back surgery could not move legs, clot removed then could move legs   Complication of anesthesia    after 6th back surgery clot on spine, could not move legs, had to have clot removed then could move legs   COPD (chronic obstructive pulmonary disease) (HCC)    chronic Bronchhititis   DJD (degenerative joint disease)    lower and upper   GERD (gastroesophageal reflux disease)    occ, no meds for   Hypertension    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   Seizures (HCC)    last seizure 5 years ago no cause found for seizures    Family History  Problem Relation Age of Onset   Diabetes Mellitus II Mother  Past Surgical History:  Procedure Laterality Date   4 surgeries right wrist     due to crush   ANTERIOR CERVICAL DECOMP/DISCECTOMY FUSION  12/20/2011   Procedure: ANTERIOR CERVICAL DECOMPRESSION/DISCECTOMY FUSION 2 LEVELS;  Surgeon: Mariam Dollar, MD;  Location: [facility];  Service: Neurosurgery;  Laterality: N/A;  Cervical four-five, five-six anterior cervical decompression/discectomy fusion with plating   BACK SURGERY     6 from degenerative disease.Marland Kitchen  1 upper neck   HARDWARE REMOVAL Left 06/11/2023   Procedure: HARDWARE REMOVAL;  Surgeon: Tarry Kos, MD;  Location: [facility] OR;  Service: Orthopedics;  Laterality: Left;   HIP SURGERY Bilateral    rods to  each hip- hip can ot of socket.   TONSILLECTOMY     TOTAL HIP ARTHROPLASTY Left 06/11/2023   Procedure: LEFT TOTAL HIP ARTHROPLASTY ANTERIOR APPROACH;  Surgeon: Tarry Kos, MD;  Location: [facility] OR;  Service: Orthopedics;  Laterality: Left;  3-C   Social History   Occupational History   Not on file  Tobacco Use   Smoking status: Former    Current packs/day: 0.00    Average packs/day: 1 pack/day for 20.0 years (20.0 ttl pk-yrs)    Types: Cigarettes    Start date: 09/22/1996    Quit date: 09/22/2016    Years since quitting: 6.8   Smokeless tobacco: Never  Vaping Use   Vaping status: Never Used  Substance and Sexual Activity   Alcohol use: Yes    Comment: occassional    Drug use: Yes    Types: Marijuana   Sexual activity: Not on file

## 2023-09-11 ENCOUNTER — Ambulatory Visit (INDEPENDENT_AMBULATORY_CARE_PROVIDER_SITE_OTHER): Payer: Medicare (Managed Care) | Admitting: Physician Assistant

## 2023-09-11 ENCOUNTER — Other Ambulatory Visit (INDEPENDENT_AMBULATORY_CARE_PROVIDER_SITE_OTHER): Payer: Self-pay

## 2023-09-11 DIAGNOSIS — Z96642 Presence of left artificial hip joint: Secondary | ICD-10-CM

## 2023-09-11 NOTE — Progress Notes (Signed)
 Post-Op Visit Note   Patient: [name]           Date of Birth: [birth year]           MRN: [id number] Visit Date: 09/11/2023 PCP: [facility]   Assessment & Plan:  Chief Complaint:  Chief Complaint  Patient presents with   Left Hip - Follow-up    06/11/23 left THA   Visit Diagnoses:  1. Status post total hip replacement, left     Plan: Patient is a pleasant 58 year old gentleman who comes in today 3 months status post removal left hip Knowles pins followed by total hip arthroplasty, date of surgery 06/11/2023.  He has been doing much better.  He is getting [facility] and is currently ambulating with a walker.  He tells me PT thinks he can graduate to a cane.  He is working on trying to stand up erect but is having some difficulty due to tightness in his hip.  He is on Suboxone.  He is not taking any muscle relaxers.  Examination of his left hip reveals painless hip flexion and logroll.  He does have limitation with standing fully erect as he is bent at the waist.  At this point, have discussed sending in a muscle relaxer.  Because he goes to [facility] I have provided him with a hard prescription.  He will continue with PT.  He will follow-up in 3 months for repeat evaluation and AP pelvis x-rays.  Dental prophylaxis reinforced.  Call with concerns or questions.  Follow-Up Instructions: Return in about 3 months (around 12/12/2023).   Orders:  Orders Placed This Encounter  Procedures   XR HIP UNILAT W OR W/O PELVIS 2-3 VIEWS LEFT   No orders of the defined types were placed in this encounter.   Imaging: XR HIP UNILAT W OR W/O PELVIS 2-3 VIEWS LEFT Result Date: 09/11/2023 X-rays demonstrate a well-seated prosthesis without complication.  Continued consolidation to the area where the Knowles pins were removed.   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    Hematemesis 10/24/2016   Past Medical History:  Diagnosis Date   Anemia 12/2016   in hospital for dcon rat poisioning for 6 days took vitamin k , accidental poisining   Blood dyscrasia    clot on lower spine after 6th back surgery could not move legs, clot removed then could move legs   Complication of anesthesia    after 6th back surgery clot on spine, could not move legs, had to have clot removed then could move legs   COPD (chronic obstructive pulmonary disease) (HCC)    chronic Bronchhititis   DJD (degenerative joint disease)    lower and upper   GERD (gastroesophageal reflux disease)    occ, no meds for   Hypertension    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  Seizures (HCC)    last seizure 5 years ago no cause found for seizures    Family History  Problem Relation Age of Onset   Diabetes Mellitus II Mother     Past Surgical History:  Procedure Laterality Date   4 surgeries right wrist     due to crush   ANTERIOR CERVICAL DECOMP/DISCECTOMY FUSION  12/20/2011   Procedure: ANTERIOR CERVICAL DECOMPRESSION/DISCECTOMY FUSION 2 LEVELS;  Surgeon: Mariam Dollar, MD;  Location: [facility];  Service: Neurosurgery;  Laterality: N/A;  Cervical four-five, five-six anterior cervical decompression/discectomy fusion with plating   BACK SURGERY     6 from degenerative disease.Marland Kitchen  1 upper neck   HARDWARE REMOVAL Left 06/11/2023   Procedure: HARDWARE REMOVAL;  Surgeon: Tarry Kos, MD;  Location:  [facility];  Service: Orthopedics;  Laterality: Left;   HIP SURGERY Bilateral    rods to each hip- hip can ot of socket.   TONSILLECTOMY     TOTAL HIP ARTHROPLASTY Left 06/11/2023   Procedure: LEFT TOTAL HIP ARTHROPLASTY ANTERIOR APPROACH;  Surgeon: Tarry Kos, MD;  Location: [facility];  Service: Orthopedics;  Laterality: Left;  3-C   Social History   Occupational History   Not on file  Tobacco Use   Smoking status: Former    Current packs/day: 0.00    Average packs/day: 1 pack/day for 20.0 years (20.0 ttl pk-yrs)    Types: Cigarettes    Start date: 09/22/1996    Quit date: 09/22/2016    Years since quitting: 6.9   Smokeless tobacco: Never  Vaping Use   Vaping status: Never Used  Substance and Sexual Activity   Alcohol use: Yes    Comment: occassional    Drug use: Yes    Types: Marijuana   Sexual activity: Not on file

## 2023-12-11 ENCOUNTER — Ambulatory Visit (INDEPENDENT_AMBULATORY_CARE_PROVIDER_SITE_OTHER): Payer: Medicare (Managed Care) | Admitting: Physician Assistant

## 2023-12-11 ENCOUNTER — Other Ambulatory Visit (INDEPENDENT_AMBULATORY_CARE_PROVIDER_SITE_OTHER): Payer: Medicare (Managed Care)

## 2023-12-11 DIAGNOSIS — Z96642 Presence of left artificial hip joint: Secondary | ICD-10-CM | POA: Diagnosis not present

## 2023-12-11 NOTE — Progress Notes (Signed)
 Post-Op Visit Note   Patient: [name]           Date of Birth: Sep 17, 1965           MRN: [id number] Visit Date: 12/11/2023 PCP: Sarrah Cure, DO   Assessment & Plan:  Chief Complaint:  Chief Complaint  Patient presents with   Left Hip - Follow-up    Left total hip arthroplasty 06/11/2023   Visit Diagnoses:  1. Status post total hip replacement, left     Plan: Patient is a pleasant 58 year old male who comes in today 6 months status post removal left hip Knowles pins followed by total hip arthroplasty, date of surgery 06/11/2023.  He has been doing great in regards to the hip.  No pain whatsoever.  He has near full range of motion without any issues.  He is however having back pain.  History of multiple back surgeries by Dr. Lamon Pillow.  Examination of his left hip reveals painless hip flexion and logroll.  He is neurovascularly intact distally.  At this point, he will continue to advance with activity as tolerated.  Dental prophylaxis reinforced.  Follow-up in 6 months for repeat evaluation and AP pelvis x-rays.  Call with concerns or questions.  Follow-Up Instructions: Return in about 6 months (around 06/11/2024).   Orders:  Orders Placed This Encounter  Procedures   XR Pelvis 1-2 Views   No orders of the defined types were placed in this encounter.   Imaging: XR Pelvis 1-2 Views Result Date: 12/11/2023 Well-seated prosthesis without complication.  There is evidence of callus over the pin site removal   PMFS History: Patient Active Problem List   Diagnosis Date Noted   Status post total hip replacement, left 06/11/2023   Cardiac arrhythmia 06/08/2023   Post-traumatic osteoarthritis of left hip 01/16/2023   Chronic midline low back pain 01/16/2023   Hematoma of left flank    Coagulopathy (HCC) 10/25/2016   Essential hypertension 10/25/2016   Diabetes mellitus type 2, controlled (HCC) 10/25/2016   Malnutrition of moderate degree 10/25/2016   Elevated partial  thromboplastin time (PTT)    Elevated protime    Gastrointestinal hemorrhage    Hematuria    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    Hematemesis 10/24/2016   Past Medical History:  Diagnosis Date   Anemia 12/2016   in hospital for dcon rat poisioning for 6 days took vitamin k , accidental poisining   Blood dyscrasia    clot on lower spine after 6th back surgery could not move legs, clot removed then could move legs   Complication of anesthesia    after 6th back surgery clot on spine, could not move legs, had to have clot removed then could move legs   COPD (chronic obstructive pulmonary disease) (HCC)    chronic Bronchhititis   DJD (degenerative joint disease)    lower and upper   GERD (gastroesophageal reflux disease)    occ, no meds for   Hypertension    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   Seizures (HCC)    last seizure 5 years ago no cause found for seizures    Family History  Problem Relation Age of Onset   Diabetes Mellitus II Mother     Past Surgical History:  Procedure Laterality Date   4 surgeries right wrist     due to crush   ANTERIOR CERVICAL DECOMP/DISCECTOMY FUSION  12/20/2011   Procedure: ANTERIOR CERVICAL DECOMPRESSION/DISCECTOMY FUSION 2 LEVELS;  Surgeon: Ferris Hua, MD;  Location: [facility];  Service: Neurosurgery;  Laterality: N/A;  Cervical four-five, five-six anterior cervical decompression/discectomy fusion with plating   BACK SURGERY     6 from degenerative disease.Aaron Aas  1 upper neck   HARDWARE REMOVAL Left 06/11/2023   Procedure: HARDWARE REMOVAL;  Surgeon: Wes Hamman, MD;  Location: [facility];  Service: Orthopedics;  Laterality: Left;   HIP SURGERY Bilateral    rods to each hip- hip can ot of socket.   TONSILLECTOMY     TOTAL HIP ARTHROPLASTY Left 06/11/2023   Procedure: LEFT TOTAL HIP ARTHROPLASTY ANTERIOR APPROACH;  Surgeon: Wes Hamman, MD;  Location: [facility];  Service: Orthopedics;  Laterality: Left;  3-C   Social  History   Occupational History   Not on file  Tobacco Use   Smoking status: Former    Current packs/day: 0.00    Average packs/day: 1 pack/day for 20.0 years (20.0 ttl pk-yrs)    Types: Cigarettes    Start date: 09/22/1996    Quit date: 09/22/2016    Years since quitting: 7.2   Smokeless tobacco: Never  Vaping Use   Vaping status: Never Used  Substance and Sexual Activity   Alcohol use: Yes    Comment: occassional    Drug use: Yes    Types: Marijuana   Sexual activity: Not on file

## 2024-02-26 NOTE — Progress Notes (Deleted)
  Cardiology Office Note:   Date:  02/26/2024  ID:  [name], DOB 03-Apr-1966, MRN [id number] PCP: Cloria Annabella CROME, DO   [facility] Providers Cardiologist:  Lynwood Schilling, MD {  History of Present Illness:   [name] is a 58 y.o. male who presents for preop evaluation prior to hip surgery.   He has not had any prior cardiac history.  He did have apparently pulmonary embolism post a back surgery some years ago.  I saw him preop last year.  He had hip surgery.  He did not have any contraindication to surgery.  ***       ***  I actually reviewed some notes from hematology when he had coagulopathy at 1 point in time with bleeding and was thought to have accidental exposure to rat poison.     He is due to have a hip surgery on Monday.  He denies any cardiovascular symptoms such as chest pressure, neck or arm discomfort.  He does not have any shortness of breath, PND or orthopnea.  He does have a history of smoking and COPD.  He is limited by his hip pain but gets around with a cane and can walk to the grocery store.  I do note an EKG that was done recently that demonstrates sinus bradycardia with bigeminy but he does not feel palpitations and has had no presyncope or syncope.  Looking through the chart I do not see any other prior cardiovascular testing    ROS: ***  Studies Reviewed:    EKG:       ***  Risk Assessment/Calculations:   {Does this patient have ATRIAL FIBRILLATION?:[id number]} No BP recorded.  {Refresh Note OR Click here to enter BP  :1}***        Physical Exam:   VS:  There were no vitals taken for this visit.   Wt Readings from Last 3 Encounters:  06/11/23 183 lb (83 kg)  06/08/23 183 lb 3.2 oz (83.1 kg)  06/06/23 183 lb 8 oz (83.2 kg)     GEN: Well nourished, well developed in no acute distress NECK: No JVD; No carotid bruits CARDIAC: ***RR, *** murmurs, rubs, gallops RESPIRATORY:  Clear to auscultation without rales, wheezing or rhonchi   ABDOMEN: Soft, non-tender, non-distended EXTREMITIES:  No edema; No deformity   ASSESSMENT AND PLAN:   Preop clearance: ***  The patient is not going for high risk surgery.  He has no high risk symptoms or physical findings.  He has had no past cardiac history.  Despite having some limited functionality he still able to achieve at least 4 METs.,  According to ACC/AHA guidelines the patient is at acceptable risk for the planned procedure.  No further cardiovascular testing is indicated.   Stop smoking.  ***  We talked about the need to quit smoking.    Arrhythmia:   **   He has no symptomatic arrhythmia.  No change in therapy.     Follow up ***  Signed, Lynwood Schilling, MD

## 2024-02-28 ENCOUNTER — Ambulatory Visit: Payer: Medicare (Managed Care) | Admitting: Cardiology

## 2024-03-31 NOTE — Progress Notes (Unsigned)
  Cardiology Office Note:   Date:  03/31/2024  ID:  [name], DOB Sep 05, 1965, MRN [id number] PCP: Cloria Annabella CROME, DO  [facility] Providers Cardiologist:  Lynwood Schilling, MD {  History of Present Illness:   [name] is a 58 y.o. male who presents for preop evaluation prior to hip surgery.   He has not had any prior cardiac history.  He did have apparently pulmonary embolism post a back surgery some years ago.  I actually reviewed some notes from hematology when he had coagulopathy at one point in time with bleeding and was thought to have accidental exposure to rat poison.    He had hip surgery after the last visit.  ***  ***   He is due to have a hip surgery on Monday.  He denies any cardiovascular symptoms such as chest pressure, neck or arm discomfort.  He does not have any shortness of breath, PND or orthopnea.  He does have a history of smoking and COPD.  He is limited by his hip pain but gets around with a cane and can walk to the grocery store.  I do note an EKG that was done recently that demonstrates sinus bradycardia with bigeminy but he does not feel palpitations and has had no presyncope or syncope.  Looking through the chart I do not see any other prior cardiovascular testing  ROS: ***  Studies Reviewed:    EKG:       ***  Risk Assessment/Calculations:   {Does this patient have ATRIAL FIBRILLATION?:[id number]} No BP recorded.  {Refresh Note OR Click here to enter BP  :1}***        Physical Exam:   VS:  There were no vitals taken for this visit.   Wt Readings from Last 3 Encounters:  06/11/23 183 lb (83 kg)  06/08/23 183 lb 3.2 oz (83.1 kg)  06/06/23 183 lb 8 oz (83.2 kg)     GEN: Well nourished, well developed in no acute distress NECK: No JVD; No carotid bruits CARDIAC: ***RR, *** murmurs, rubs, gallops RESPIRATORY:  Clear to auscultation without rales, wheezing or rhonchi  ABDOMEN: Soft, non-tender, non-distended EXTREMITIES:  No edema; No  deformity   ASSESSMENT AND PLAN:    Stop smoking.  *** We talked about the need to quit smoking.    Arrhythmia:  ***   He has no symptomatic arrhythmia.  No change in therapy.     Follow up ***  Signed, Lynwood Schilling, MD

## 2024-04-02 ENCOUNTER — Ambulatory Visit: Payer: Medicare (Managed Care) | Attending: Internal Medicine | Admitting: Cardiology

## 2024-04-02 ENCOUNTER — Encounter: Payer: Self-pay | Admitting: Cardiology

## 2024-04-02 VITALS — BP 160/92 | HR 64 | Ht 69.0 in | Wt 180.0 lb

## 2024-04-02 DIAGNOSIS — I499 Cardiac arrhythmia, unspecified: Secondary | ICD-10-CM

## 2024-04-02 NOTE — Patient Instructions (Signed)

## 2024-06-11 ENCOUNTER — Ambulatory Visit: Payer: Medicare (Managed Care) | Admitting: Physician Assistant

## 2024-06-13 ENCOUNTER — Ambulatory Visit: Payer: Medicare (Managed Care) | Admitting: Physician Assistant

## 2024-07-01 ENCOUNTER — Other Ambulatory Visit: Payer: Self-pay | Admitting: Neurosurgery

## 2024-07-01 DIAGNOSIS — M5440 Lumbago with sciatica, unspecified side: Secondary | ICD-10-CM

## 2024-07-22 ENCOUNTER — Other Ambulatory Visit (INDEPENDENT_AMBULATORY_CARE_PROVIDER_SITE_OTHER): Payer: Self-pay

## 2024-07-22 ENCOUNTER — Ambulatory Visit: Payer: Medicare (Managed Care) | Admitting: Physician Assistant

## 2024-07-22 DIAGNOSIS — Z96642 Presence of left artificial hip joint: Secondary | ICD-10-CM | POA: Diagnosis not present

## 2024-07-22 NOTE — Progress Notes (Signed)
 "  Post-Op Visit Note   Patient: [name]           Date of Birth: [birth year]           MRN: [id number] Visit Date: 07/22/2024 PCP: Cloria Annabella CROME, DO   Assessment & Plan:  Chief Complaint:  Chief Complaint  Patient presents with   Left Hip - Follow-up    Left THA 06/11/2023   Visit Diagnoses:  1. Status post total hip replacement, left     Plan: Patient is a pleasant 59 year old gentleman who comes in today 1 year status post left total hip replacement.  He has been doing great.  No complaints about his left hip.  He has been seeing Dr. Onetha however for his low back and is supposed to be undergoing another lumbar spine surgery in the near future.  Left hip exam: Painless hip flexion and logroll.  At this point, he will continue to advance with activity as tolerated.  Follow-up in 1 year for repeat evaluation and AP pelvis x-rays.  Call with concerns or questions.  Follow-Up Instructions: Return in about 1 year (around 07/22/2025).   Orders:  Orders Placed This Encounter  Procedures   XR Pelvis 1-2 Views   No orders of the defined types were placed in this encounter.   Imaging: XR Pelvis 1-2 Views Result Date: 07/22/2024 Well-seated prosthesis without complication   PMFS History: Patient Active Problem List   Diagnosis Date Noted   Status post total hip replacement, left 06/11/2023   Cardiac arrhythmia 06/08/2023   Post-traumatic osteoarthritis of left hip 01/16/2023   Chronic midline low back pain 01/16/2023   Hematoma of left flank    Coagulopathy 10/25/2016   Essential hypertension 10/25/2016   Diabetes mellitus type 2, controlled (HCC) 10/25/2016   Malnutrition of moderate degree 10/25/2016   Elevated partial thromboplastin time (PTT)    Elevated protime    Gastrointestinal hemorrhage    Hematuria    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    Hematemesis 10/24/2016   Past Medical History:  Diagnosis Date   Anemia 12/2016   in hospital for dcon rat  poisioning for 6 days took vitamin k  , accidental poisining   Blood dyscrasia    clot on lower spine after 6th back surgery could not move legs, clot removed then could move legs   Complication of anesthesia    after 6th back surgery clot on spine, could not move legs, had to have clot removed then could move legs   COPD (chronic obstructive pulmonary disease) (HCC)    chronic Bronchhititis   DJD (degenerative joint disease)    lower and upper   GERD (gastroesophageal reflux disease)    occ, no meds for   Hypertension    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   Seizures (HCC)    last seizure 5 years ago no cause found for seizures    Family History  Problem Relation Age of Onset   Diabetes Mellitus II Mother     Past Surgical History:  Procedure Laterality Date   4 surgeries right wrist     due to crush   ANTERIOR CERVICAL DECOMP/DISCECTOMY FUSION  12/20/2011   Procedure: ANTERIOR CERVICAL DECOMPRESSION/DISCECTOMY FUSION 2 LEVELS;  Surgeon: Arley SHAUNNA Onetha, MD;  Location: [facility];  Service: Neurosurgery;  Laterality: N/A;  Cervical four-five, five-six anterior cervical decompression/discectomy fusion with plating   BACK SURGERY     6 from degenerative disease.SABRA  1 upper neck   HARDWARE REMOVAL Left 06/11/2023   Procedure: HARDWARE REMOVAL;  Surgeon: Jerri Kay HERO, MD;  Location: [facility];  Service: Orthopedics;  Laterality: Left;   HIP SURGERY Bilateral    rods to each hip- hip can ot of socket.   TONSILLECTOMY     TOTAL HIP ARTHROPLASTY Left 06/11/2023   Procedure: LEFT TOTAL HIP ARTHROPLASTY ANTERIOR APPROACH;  Surgeon: Jerri Kay HERO, MD;  Location: [facility];  Service: Orthopedics;  Laterality: Left;  3-C   Social History   Occupational History   Not on file  Tobacco Use   Smoking status: Every Day    Current packs/day: 0.00    Average packs/day: 1 pack/day for 20.0 years (20.0 ttl pk-yrs)    Types: Cigarettes    Start date: 09/22/1996    Last attempt to  quit: 09/22/2016    Years since quitting: 7.8   Smokeless tobacco: Never  Vaping Use   Vaping status: Never Used  Substance and Sexual Activity   Alcohol use: Yes    Comment: occassional    Drug use: Yes    Types: Marijuana   Sexual activity: Not on file     "

## 2024-09-10 ENCOUNTER — Other Ambulatory Visit: Payer: Medicare (Managed Care)
